# Patient Record
Sex: Male | Born: 1955
Health system: Southern US, Community
[De-identification: ages and names within clinical notes are randomized; demographics above are authoritative.]

## PROBLEM LIST (undated history)

## (undated) DIAGNOSIS — M109 Gout, unspecified: Secondary | ICD-10-CM

## (undated) DIAGNOSIS — I10 Essential (primary) hypertension: Secondary | ICD-10-CM

## (undated) DIAGNOSIS — M1990 Unspecified osteoarthritis, unspecified site: Secondary | ICD-10-CM

## (undated) DIAGNOSIS — M5126 Other intervertebral disc displacement, lumbar region: Secondary | ICD-10-CM

## (undated) HISTORY — PX: BACK SURGERY: SHX140

---

## 2003-09-23 ENCOUNTER — Emergency Department (HOSPITAL_COMMUNITY): Admission: EM | Admit: 2003-09-23 | Discharge: 2003-09-23 | Payer: Self-pay | Admitting: Emergency Medicine

## 2005-04-16 ENCOUNTER — Emergency Department (HOSPITAL_COMMUNITY): Admission: EM | Admit: 2005-04-16 | Discharge: 2005-04-16 | Payer: Self-pay | Admitting: Family Medicine

## 2005-04-18 ENCOUNTER — Emergency Department (HOSPITAL_COMMUNITY): Admission: AD | Admit: 2005-04-18 | Discharge: 2005-04-18 | Payer: Self-pay | Admitting: Family Medicine

## 2005-04-18 ENCOUNTER — Ambulatory Visit (HOSPITAL_COMMUNITY): Admission: RE | Admit: 2005-04-18 | Discharge: 2005-04-18 | Payer: Self-pay | Admitting: Family Medicine

## 2008-08-26 ENCOUNTER — Emergency Department (HOSPITAL_COMMUNITY): Admission: EM | Admit: 2008-08-26 | Discharge: 2008-08-26 | Payer: Self-pay | Admitting: Emergency Medicine

## 2008-12-31 ENCOUNTER — Emergency Department (HOSPITAL_COMMUNITY): Admission: EM | Admit: 2008-12-31 | Discharge: 2008-12-31 | Payer: Self-pay | Admitting: Emergency Medicine

## 2009-06-30 ENCOUNTER — Emergency Department (HOSPITAL_COMMUNITY): Admission: EM | Admit: 2009-06-30 | Discharge: 2009-06-30 | Payer: Self-pay | Admitting: Emergency Medicine

## 2009-08-11 ENCOUNTER — Ambulatory Visit: Payer: Self-pay | Admitting: Internal Medicine

## 2009-09-01 ENCOUNTER — Ambulatory Visit: Payer: Self-pay | Admitting: Family Medicine

## 2009-09-04 ENCOUNTER — Ambulatory Visit (HOSPITAL_COMMUNITY): Admission: RE | Admit: 2009-09-04 | Discharge: 2009-09-04 | Payer: Self-pay | Admitting: Family Medicine

## 2009-09-12 ENCOUNTER — Ambulatory Visit: Payer: Self-pay | Admitting: Internal Medicine

## 2009-09-12 ENCOUNTER — Encounter (INDEPENDENT_AMBULATORY_CARE_PROVIDER_SITE_OTHER): Payer: Self-pay | Admitting: Family Medicine

## 2009-09-12 LAB — CONVERTED CEMR LAB
ALT: 71 units/L — ABNORMAL HIGH (ref 0–53)
Alkaline Phosphatase: 54 units/L (ref 39–117)
Basophils Absolute: 0.1 10*3/uL (ref 0.0–0.1)
Basophils Relative: 1 % (ref 0–1)
CO2: 22 meq/L (ref 19–32)
CRP: 0 mg/dL (ref <0.6)
Cholesterol: 108 mg/dL (ref 0–200)
Creatinine, Ser: 1.16 mg/dL (ref 0.40–1.50)
MCHC: 32.6 g/dL (ref 30.0–36.0)
Neutro Abs: 2.4 10*3/uL (ref 1.7–7.7)
Neutrophils Relative %: 43 % (ref 43–77)
Platelets: 152 10*3/uL (ref 150–400)
RDW: 12.6 % (ref 11.5–15.5)
Sed Rate: 3 mm/hr (ref 0–16)
Total Bilirubin: 1.1 mg/dL (ref 0.3–1.2)
Total CHOL/HDL Ratio: 3.1
VLDL: 21 mg/dL (ref 0–40)

## 2009-09-29 ENCOUNTER — Encounter (INDEPENDENT_AMBULATORY_CARE_PROVIDER_SITE_OTHER): Payer: Self-pay | Admitting: Family Medicine

## 2009-09-29 ENCOUNTER — Ambulatory Visit: Payer: Self-pay | Admitting: Internal Medicine

## 2009-09-29 LAB — CONVERTED CEMR LAB
Eosinophils Relative: 4 % (ref 0–5)
HCT: 47.3 % (ref 39.0–52.0)
HCV Ab: REACTIVE — AB
Hemoglobin: 15.5 g/dL (ref 13.0–17.0)
INR: 1.04 (ref <1.50)
Lymphocytes Relative: 39 % (ref 12–46)
Lymphs Abs: 1.9 10*3/uL (ref 0.7–4.0)
Monocytes Absolute: 0.4 10*3/uL (ref 0.1–1.0)
RDW: 12.5 % (ref 11.5–15.5)
WBC: 4.9 10*3/uL (ref 4.0–10.5)
aPTT: 32 s (ref 24–37)

## 2009-11-04 ENCOUNTER — Ambulatory Visit: Payer: Self-pay | Admitting: Internal Medicine

## 2009-11-04 ENCOUNTER — Encounter (INDEPENDENT_AMBULATORY_CARE_PROVIDER_SITE_OTHER): Payer: Self-pay | Admitting: Family Medicine

## 2009-11-04 LAB — CONVERTED CEMR LAB
HCV Quantitative: 1410000 intl units/mL — ABNORMAL HIGH (ref <43)
Hepatitis B Surface Ag: NEGATIVE

## 2009-11-21 ENCOUNTER — Ambulatory Visit (HOSPITAL_COMMUNITY): Admission: RE | Admit: 2009-11-21 | Discharge: 2009-11-21 | Payer: Self-pay | Admitting: Family Medicine

## 2009-11-25 ENCOUNTER — Ambulatory Visit: Payer: Self-pay | Admitting: Internal Medicine

## 2009-12-01 ENCOUNTER — Ambulatory Visit (HOSPITAL_COMMUNITY): Admission: RE | Admit: 2009-12-01 | Discharge: 2009-12-01 | Payer: Self-pay | Admitting: Internal Medicine

## 2010-02-02 ENCOUNTER — Ambulatory Visit: Payer: Self-pay | Admitting: Family Medicine

## 2010-02-02 DIAGNOSIS — M25559 Pain in unspecified hip: Secondary | ICD-10-CM

## 2010-02-06 ENCOUNTER — Ambulatory Visit (HOSPITAL_COMMUNITY): Admission: RE | Admit: 2010-02-06 | Discharge: 2010-02-06 | Payer: Self-pay | Admitting: Family Medicine

## 2010-02-10 ENCOUNTER — Encounter (INDEPENDENT_AMBULATORY_CARE_PROVIDER_SITE_OTHER): Payer: Self-pay | Admitting: *Deleted

## 2010-02-10 ENCOUNTER — Telehealth (INDEPENDENT_AMBULATORY_CARE_PROVIDER_SITE_OTHER): Payer: Self-pay | Admitting: *Deleted

## 2010-02-18 ENCOUNTER — Telehealth (INDEPENDENT_AMBULATORY_CARE_PROVIDER_SITE_OTHER): Payer: Self-pay | Admitting: *Deleted

## 2010-02-19 ENCOUNTER — Encounter (INDEPENDENT_AMBULATORY_CARE_PROVIDER_SITE_OTHER): Payer: Self-pay | Admitting: *Deleted

## 2010-02-23 ENCOUNTER — Encounter (INDEPENDENT_AMBULATORY_CARE_PROVIDER_SITE_OTHER): Payer: Self-pay | Admitting: *Deleted

## 2010-02-24 ENCOUNTER — Ambulatory Visit (HOSPITAL_COMMUNITY): Admission: RE | Admit: 2010-02-24 | Discharge: 2010-02-24 | Payer: Self-pay | Admitting: Family Medicine

## 2010-02-26 ENCOUNTER — Telehealth (INDEPENDENT_AMBULATORY_CARE_PROVIDER_SITE_OTHER): Payer: Self-pay | Admitting: *Deleted

## 2010-02-27 ENCOUNTER — Ambulatory Visit: Payer: Self-pay | Admitting: Family Medicine

## 2010-03-03 ENCOUNTER — Encounter (INDEPENDENT_AMBULATORY_CARE_PROVIDER_SITE_OTHER): Payer: Self-pay | Admitting: *Deleted

## 2010-03-05 ENCOUNTER — Ambulatory Visit: Payer: Self-pay | Admitting: Gastroenterology

## 2010-03-17 ENCOUNTER — Encounter: Payer: Self-pay | Admitting: Family Medicine

## 2010-04-06 ENCOUNTER — Ambulatory Visit: Payer: Self-pay | Admitting: Family Medicine

## 2010-04-14 ENCOUNTER — Telehealth: Payer: Self-pay | Admitting: Family Medicine

## 2010-04-27 ENCOUNTER — Encounter: Payer: Self-pay | Admitting: Family Medicine

## 2010-04-29 ENCOUNTER — Encounter: Payer: Self-pay | Admitting: Family Medicine

## 2010-05-06 ENCOUNTER — Encounter (INDEPENDENT_AMBULATORY_CARE_PROVIDER_SITE_OTHER): Payer: Self-pay | Admitting: *Deleted

## 2010-05-18 ENCOUNTER — Ambulatory Visit
Admission: RE | Admit: 2010-05-18 | Discharge: 2010-05-18 | Payer: Self-pay | Source: Home / Self Care | Attending: Family Medicine | Admitting: Family Medicine

## 2010-05-18 ENCOUNTER — Encounter: Payer: Self-pay | Admitting: Family Medicine

## 2010-05-31 ENCOUNTER — Encounter: Payer: Self-pay | Admitting: Family Medicine

## 2010-06-02 ENCOUNTER — Telehealth: Payer: Self-pay | Admitting: Family Medicine

## 2010-06-08 ENCOUNTER — Ambulatory Visit
Admission: RE | Admit: 2010-06-08 | Discharge: 2010-06-08 | Payer: Self-pay | Source: Home / Self Care | Attending: Family Medicine | Admitting: Family Medicine

## 2010-06-08 ENCOUNTER — Encounter: Payer: Self-pay | Admitting: Family Medicine

## 2010-06-11 NOTE — Letter (Signed)
Summary: Disability Determination Services  Disability Determination Services   Imported By: Marily Memos 03/18/2010 12:05:18  _____________________________________________________________________  External Attachment:    Type:   Image     Comment:   External Document

## 2010-06-11 NOTE — Progress Notes (Signed)
Summary: letter for disability  Phone Note Call from Patient   Caller: Patient Summary of Call: Spoke with pt- he states he was denied for disability because he needs a letter from Dr. Jennette Kettle stating what his dx is and how that causes him to be disabled.  Advised will let Dr. Jennette Kettle know he needs letter.  Pt asks that he be called when letter is complete, and he will pick it up at office. Initial call taken by: Rochele Pages RN,  April 14, 2010 10:41 AM      Complete Medication List: 1)  Vicodin 5-500 Mg Tabs (Hydrocodone-acetaminophen) .... 2 tabs by mouth two times a day as needed pain 2)  Naproxen 500 Mg Tabs (Naproxen) .... I tab daily

## 2010-06-11 NOTE — Progress Notes (Signed)
----   Converted from flag ---- ---- 02/17/2010 4:29 PM, Lillia Pauls CMA wrote: ---- 02/17/2010 1:33 PM, Marily Memos wrote: Pt left a message asking for you to call him regarding his MRI.  call back # [phone] ------------------------------ called and no answer--no voice mail. will try later  Denny Levy MD  February 18, 2010 9:01 AM [place]--I have tried this guy twice and no answer--maybe you could try him this PM? All I need to ask him is does he want me to set up an LS Spine MRI--his hip MRI was totally normal but his hip pain MAY be coming from his back-Thanks!  Denny Levy MD  February 19, 2010 11:03 AM   tried both numbers twic and no answer. will mail letter. Lillia Pauls CMA  February 19, 2010 1:59 PM

## 2010-06-11 NOTE — Letter (Signed)
Summary: [facility] orthopaedics clinic referral form  [facility] orthopaedics clinic referral form   Imported By: Marily Memos 04/30/2010 08:46:45  _____________________________________________________________________  External Attachment:    Type:   Image     Comment:   External Document

## 2010-06-11 NOTE — Letter (Signed)
Summary: Results Follow-up Letter  [facility]  [address]   [place], [place] [address]   Phone: [phone]  Fax: [phone]    02/19/2010  [address] [place], [place]  [address]  Dear Mr. HAYDON,   The following are the results of your recent test(s):  Test     Result     Pap Smear    Normal_______  Not Normal_____       Comments: _________________________________________________________ Cholesterol LDL(Bad cholesterol):          Your goal is less than:         HDL (Good cholesterol):        Your goal is more than: _________________________________________________________ Other Tests:  Attempted to call to ask if you wanted our office to set up an LS Spine MRI--your hip MRI was totally normal but your hip pain MAY be coming from your back-Thanks! _________________________________________________________  Please call for an appointment Or _________________________________________________________ _________________________________________________________ _________________________________________________________  Sincerely,  Denny Levy, M.D./[facility]

## 2010-06-11 NOTE — Progress Notes (Signed)
  Phone Note Outgoing Call   Summary of Call: Arelia Longest / Amy once again I cannot get hold of this fellow. Ileft a message with the "person who owns the phone" for Mr Null to call me. IF he calls back and you get the call, please tell him this: His hip pain is VERY LIKELY from his back--he has a LOT of back arthritis and would PROBABLY benefit from back surgery. Unfortunately he has no insurance---so if he wants to pursue surgery we MIGHT be able to send him to [facility].  Denny Levy MD  February 26, 2010 9:34 AM    Follow-up for Phone Call        pt has appt on 02/26/10 with you Follow-up by: Lillia Pauls CMA,  February 26, 2010 3:33 PM

## 2010-06-11 NOTE — Miscellaneous (Signed)
Summary: MED UPDATE  Clinical Lists Changes  Medications: Added new medication of VICODIN 5-500 MG TABS (HYDROCODONE-ACETAMINOPHEN) ii tabs daily Added new medication of NAPROXEN 500 MG TABS (NAPROXEN) i tab daily Added new medication of GABAPENTIN 300 MG CAPS (GABAPENTIN) 1 qpm x 7d then 2 qpm x 7d then 3 qpm x 7d then 3 qpm & 1 qam x 7d then 3 qpm 2 qam x 7d then 3 qpm  and 3 qam until fu appt - Signed Rx of GABAPENTIN 300 MG CAPS (GABAPENTIN) 1 qpm x 7d then 2 qpm x 7d then 3 qpm x 7d then 3 qpm & 1 qam x 7d then 3 qpm 2 qam x 7d then 3 qpm  and 3 qam until fu appt;  #180 x 0;  Signed;  Entered by: Lillia Pauls CMA;  Authorized by: Denny Levy MD;  Method used: Historical   1 qpm x 7d then 2 qpm x 7d then 3 qpm x 7d then 3 qpm & 1 qam x 7d then 3 qpm 2 qam x 7d then 3 qpm  and 3 qam  Prescriptions: GABAPENTIN 300 MG CAPS (GABAPENTIN) 1 qpm x 7d then 2 qpm x 7d then 3 qpm x 7d then 3 qpm & 1 qam x 7d then 3 qpm 2 qam x 7d then 3 qpm  and 3 qam until fu appt  #180 x 0   Entered by:   Lillia Pauls CMA   Authorized by:   Denny Levy MD   Signed by:   Lillia Pauls CMA on 03/03/2010   Method used:   Historical   RxID:   [id number]

## 2010-06-11 NOTE — Assessment & Plan Note (Signed)
Summary: FU PER [name] [name]/MJD   Vital Signs:  Patient profile:   55 year old male BP sitting:   136 / 97  Vitals Entered By: Lillia Pauls CMA (May 18, 2010 1:28 PM)  History of Present Illness: hip pain continues vicodin is making him nauseated  and diarrhea--alsoo it is not really controlling his pain--it is 7/10 most of the time, 9/10 without meds wants totry something different has appt Jan 18 with ortho at [facility]  Current Medications (verified): 1)  Roxicodone 15 Mg Tabs (Oxycodone Hcl) .... Generic Please 1 By Mouth Qid As Needed Pain 2)  Naproxen 500 Mg Tabs (Naproxen) .... I Tab Daily  Allergies: No Known Drug Allergies  Physical Exam  General:  alert, well-developed, well-nourished, and well-hydrated.   Msk:  R hip decreased IR /ER. Gait antalgic mild atrophy right quad distally neurovascualrly intact   Impression & Recommendations:  Problem # 1:  HIP PAIN, RIGHT (ICD-719.45)  His updated medication list for this problem includes:    Roxicodone 15 Mg Tabs (Oxycodone hcl) .Marland Kitchen... Generic please 1 by mouth qid as needed pain    Naproxen 500 Mg Tabs (Naproxen) ..... I tab daily f/ui after his appt [facility] will try oxicodone instead of hydrocodone  Complete Medication List: 1)  Roxicodone 15 Mg Tabs (Oxycodone hcl) .... Generic please 1 by mouth qid as needed pain 2)  Naproxen 500 Mg Tabs (Naproxen) .... I tab daily Prescriptions: ROXICODONE 15 MG TABS (OXYCODONE HCL) generic please 1 by mouth qid as needed pain  #120 x 0   Entered and Authorized by:   Denny Levy MD   Signed by:   Denny Levy MD on 05/18/2010   Method used:   Print then Give to Patient   RxID:   [id number]    Orders Added: 1)  Est. Patient Level III [14782]

## 2010-06-11 NOTE — Assessment & Plan Note (Signed)
Summary: POST MRI,MC   Vital Signs:  Patient profile:   55 year old male Pulse rate:   64 / minute BP sitting:   112 / 74  (right arm)  Vitals Entered By: Rochele Pages RN (February 27, 2010 9:53 AM) CC: f/u MRI   CC:  f/u MRI.  History of Present Illness: f/u right hip pain 9see previous notes) No better, infact itis hurting more--he thinks maybe because the weather is getting cooler. He is having more difficulty getting up from a seated position, more pain with sitting or lying down that he was. He continues with severe right hip pain with standing and is using a cane for walking--can walk 1/2 a block at most at a time.  Pain is in right hip and radiates down a little to front of thigh.  PERTINENT PMH/PSH: 49 y ago he had back surgery (some type of low back disc surgery???) and then 3 days later he "blew out the patch" they had placed; he returned to the hosdpital and had some type of procedure done to "replace te plug". This was done in [place] .  Allergies: No Known Drug Allergies  Review of Systems  The patient denies anorexia, fever, weight loss, and weight gain.    Physical Exam  General:  alert and well-developed.   Eyes:  left esotropia Msk:  RIght hip decreased IR / ER. + SLR right and left hip flexor strength 5/5 B Additional Exam:  LS  Spine MRI: I reviewed images with Mr CDoles--I agree with the radiology interpretation as printed--essentially severe central canal stenosis at L4-5, L5 S1 with B facet arthrooppathy at L5-S1. "Bione fragement noted".   Impression & Recommendations:  Problem # 1:  DEGENERATIVE JOINT DISEASE, LUMBAR SPINE (ICD-721.90) I think thhi is cause  of hi s hip pain. His lumbar disease is fairly severe--he likely needs an eval by a surgeon --as he has no insurance at this time I cannot get him seen easily.   we spent > 50% of 40 minute ov reviewing his MRI images, in counseling and education re his source of hip pain (back) and his  options. We wil ltry tapering up his gabapentin and rtc. Red flags discussed for more urgent return to clinic otherwise rtc 5-6 weeks.  Patient Instructions: 1)  start gabapentin 2)   one tab at night for a week,  3)  then increase to two tabs at nigt for a week, 4)   then three tabs at night for a week,  5)  then three tabs at night and one in morning for a week, 6)   then 3 at night, two in morning and  7)  then three at night and three in morning and keep on this dose until I see you back 8)  Let me see you in 5 weeks or so   Orders Added: 1)  Est. Patient Level IV [56213]

## 2010-06-11 NOTE — Progress Notes (Signed)
  Phone Note Outgoing Call   Summary of Call: [place] / Amy I have tried calling this gentleman re his MRI--the work number is a [employer] and his phone number is not working right now (temporarily out of service)--could you try him later or see if we have another number for him--I want to tell him that his HIP MRI was actually totally normal. I think we need to do a LS spine MRI as I suspect his pain is actually from te low back. If you get hold of him please set up. I willl put order in. Thanks!  Denny Levy MD  February 10, 2010 10:38 AM   Follow-up for Phone Call        mailed letter Follow-up by: Lillia Pauls CMA,  February 10, 2010 2:17 PM  New Problems: DEGENERATIVE JOINT DISEASE, LUMBAR SPINE (ICD-721.90)   New Problems: DEGENERATIVE JOINT DISEASE, LUMBAR SPINE (ICD-721.90)

## 2010-06-11 NOTE — Progress Notes (Signed)
----   Converted from flag ---- ---- 06/02/2010 1:49 PM, [name] RN wrote: pt sts [place] and [place] have not come in from the flight yet...of course his meds are in his bags.[name] [name].he wants to know if he can get a refill/some meds until his bags arrive. thx ------------------------------

## 2010-06-11 NOTE — Assessment & Plan Note (Signed)
Summary: RT PELVIC PAIN,MC   Vital Signs:  Patient profile:   55 year old male Height:      73 inches Weight:      210 pounds BMI:     27.81 Pulse rate:   87 / minute BP sitting:   147 / 88  (right arm) CC: RT Hip pain x 1 year worsening   CC:  RT Hip pain x 1 year worsening.  History of Present Illness: 1 year worsening hip pain. Has had to decrease his activitis. pain is worse with walking--even one block ispainful. Rest makes it better. no numbness or tingling in legs. no incontinence  Current Medications (verified): 1)  None  Allergies (verified): No Known Drug Allergies  Review of Systems  The patient denies anorexia, fever, weight loss, and weight gain.    Physical Exam  General:  alert, well-developed, well-nourished, and well-hydrated.   Msk:  Right hip decreased IR and ER compared with left. Painful IR / ER hip flexor strngth 5/5. quad muscle bulk and tone symmetrical  GAIT: antalgic with shortened stance time on right Neurologic:  LE sensation intact to soft touch Additional Exam:  reviewed hip films from April--he seems to have a shallow acetabulum.   Impression & Recommendations:  Problem # 1:  HIP PAIN, RIGHT (ICD-719.45)  Orders: MRI without Contrast (MRI w/o Contrast) His DJD does not look significant enough on his films to account for his limitations--I wonder if he is experiencing AVN hip--esp given his apparent shallow acetabulum. Will get MRI as he is virtually unable to work currently, very debilitated in his activities.  Patient Instructions: 1)  mri appt is on thurs morning, sept 29th at 8am but you need to be there by 7:30 to register at [facility] hospital. [phone]

## 2010-06-11 NOTE — Letter (Signed)
Summary: *Referral Letter  [facility]  [address]   [place], [place] [address]   Phone: [phone]  Fax: [phone]    05/18/2010  Thank you in advance for agreeing to see my patient:  [name] [name] [address] [place], [place]  [address]  Phone: [phone]  Reason for Referral: Right hip pain, possibly from lumbar source MRI report attached as is hip xray   Procedures Requested:   Current Medical Problems: 1)  DEGENERATIVE JOINT DISEASE, LUMBAR SPINE (ICD-721.90) 2)  HIP PAIN, RIGHT (ICD-719.45)   Current Medications: 1)  ROXICODONE 15 MG TABS (OXYCODONE HCL) generic please 1 by mouth qid as needed pain 2)  NAPROXEN 500 MG TABS (NAPROXEN) i tab daily   Past Medical History:     Thank you again for agreeing to see our patient; please contact us if you have any further questions or need additional information.  Sincerely,  Denny Levy MD

## 2010-06-11 NOTE — Letter (Signed)
Summary: *Consult Note  [facility]  [address]   [place], [place] [address]   Phone: [phone]  Fax: [phone]    Re:    [name] [name] DOB:    [birth year]   Dear:    A copy of the detailed office note will be sent under separate cover, for your review at your request.  Evaluation today is consistent with: severe RIGHT HIP OSTEOARTHRITIS. [name] [name] has severe pain in his hip. This is worse with  standing or walking very far( > 50 feet)  Our recommendation is for: evaluation by orthopedic surgeon for total hip replacement.   New Orders include:  1)  Orthopedic Referral [Ortho]   New Medications started today include:    After today's visit, the patients current medications include: 1)  VICODIN 5-500 MG TABS (HYDROCODONE-ACETAMINOPHEN) 2 tabs by mouth two times a day as needed pain 2)  NAPROXEN 500 MG TABS (NAPROXEN) i tab daily   Thank you for this consultation.  If you have any further questions regarding the care of this patient, please do not hesitate to contact me @ [phone]  Thank you for this opportunity to look after your patient.  Sincerely,   Denny Levy MD

## 2010-06-11 NOTE — Assessment & Plan Note (Signed)
Summary: F/U POST MRI,MC   Vital Signs:  Patient profile:   55 year old male BP sitting:   135 / 89  Vitals Entered By: Lillia Pauls CMA (April 06, 2010 1:34 PM)  History of Present Illness: RIGHT hip pain no better, infact he thinks it is worse. Hurts all of the time at least at a 6 level, activity making it go up to 9 or 10. He tapered up on the gabapentin but it has not seemed to help. Wants to pursue referral to [facility]  Current Medications (verified): 1)  Vicodin 5-500 Mg Tabs (Hydrocodone-Acetaminophen) .... Ii Tabs Daily 2)  Naproxen 500 Mg Tabs (Naproxen) .... I Tab Daily 3)  Gabapentin 300 Mg Caps (Gabapentin) .Marland Kitchen.. 1 Qpm X 7d Then 2 Qpm X 7d Then 3 Qpm X 7d Then 3 Qpm & 1 Qam X 7d Then 3 Qpm 2 Qam X 7d Then 3 Qpm  and 3 Qam Until Fu Appt  Allergies: No Known Drug Allergies  Review of Systems       no new hip symptoms except for increased pain  Physical Exam  General:  alert, well-developed, well-nourished, and well-hydrated.   Msk:  RIGT hip IR /ER is symptoms,,etrical with that of left (mildly limited--not painful) SLR Positive Right Additional Exam:  review of MRI   Impression & Recommendations:  Problem # 1:  DEGENERATIVE JOINT DISEASE, LUMBAR SPINE (ICD-721.90) will d/c gabapentin pain meds for two times a day use. try to get him referral to [facility] rtc 1 m  Complete Medication List: 1)  Vicodin 5-500 Mg Tabs (Hydrocodone-acetaminophen) .... 2 tabs by mouth two times a day as needed pain 2)  Naproxen 500 Mg Tabs (Naproxen) .... I tab daily Prescriptions: VICODIN 5-500 MG TABS (HYDROCODONE-ACETAMINOPHEN) 2 tabs by mouth two times a day as needed pain  #60 x 0   Entered and Authorized by:   Denny Levy MD   Signed by:   Denny Levy MD on 04/07/2010   Method used:   Handwritten   RxID:   [id number]    Orders Added: 1)  Est. Patient Level III [[address]]

## 2010-06-11 NOTE — Miscellaneous (Signed)
  MRI OF LS SPINE IS SCHD FOR 02/24/10 AT 3 PM AT [facility]. PT TO ARRIVE IN ADMITTING AT 2:45PM FOR REGISTRATION. KEEP APPT SCHD FOR FRI WITH DR. Jennette Kettle [phone]

## 2010-06-11 NOTE — Letter (Signed)
Summary: Results Follow-up Letter  [facility]  [address]   [place], [place] [address]   Phone: [phone]  Fax: [phone]    02/10/2010  [address] [place], [place]  [address]  Dear Mr. PLOEGER,   The following are the results of your recent test(s):  Test     Result     Pap Smear    Normal_______  Not Normal_____       Comments: _________________________________________________________ Cholesterol LDL(Bad cholesterol):          Your goal is less than:         HDL (Good cholesterol):        Your goal is more than: _________________________________________________________ Other Tests:  We have tried several time to contact you with the numbers we have in our database but no success. Wanted to inform you that your MRI of your hip was totally normal but I do think we need to get a LS spine MRI (of your low back) because i do suspect your pain may be coming from the low back  _________________________________________________________  Please call for an appointment Or _________________________________________________________ _________________________________________________________ _________________________________________________________  Sincerely,  Denny Levy, M.D. [facility]

## 2010-06-11 NOTE — Letter (Signed)
Summary: Generic Letter  [facility]  [address]   [place], [place] [address]   Phone: [phone]  Fax: [phone]    05/06/2010  [name] [name] [address] [place], [place]  [address]  Dear [name] [name],  I have made a referral for you to [facility] for evaluation of your knee pain.  Their office will be calling you to set up your appointment.  The telephone number for [facility] is [phone].         Sincerely,   [name] RN  Appended Document: Generic Letter Letter completed per patient's request.

## 2010-06-16 ENCOUNTER — Telehealth: Payer: Self-pay | Admitting: Family Medicine

## 2010-06-17 NOTE — Letter (Signed)
Summary: [facility] healthcare   Imported By: Marily Memos 06/08/2010 14:07:58  _____________________________________________________________________  External Attachment:    Type:   Image     Comment:   External Document

## 2010-06-17 NOTE — Letter (Signed)
Summary: [facility]  ROI   Imported By: Marily Memos 06/08/2010 14:06:49  _____________________________________________________________________  External Attachment:    Type:   Image     Comment:   External Document

## 2010-06-17 NOTE — Assessment & Plan Note (Signed)
Summary: F/U HIP,MC   Vital Signs:  Patient profile:   55 year old male BP sitting:   149 / 95  Vitals Entered By: Rochele Pages RN (June 08, 2010 1:36 PM)  History of Present Illness: f/u hip pain, low back problems. was seen at [facility] clinics--was actually seen there twice--they referred him to spine specialist and pain clinic. Per Zollie Beckers, they agreed his hip pain  ios likely form his back and taht surgery might help--he does not have the funds to try surgery. They also offered injection tehrapy but he would have to have $500 up fron for that so he is not sure that is an option either. He has f/u appt with them in a month. Ininterim, they wantd him to continue the narcotic pain meds we had started---he siad that really helped. If he takes the roxicodone qid he was able to do all of his adls, evenwas able to take his sone to a ball game. No dizziness or anorexia from this med.  Current Medications (verified): 1)  Roxicodone 15 Mg Tabs (Oxycodone Hcl) .... Generic Please 1 By Mouth Qid As Needed Pain 2)  Naproxen 500 Mg Tabs (Naproxen) .... I Tab Daily  Allergies: No Known Drug Allergies  Review of Systems  The patient denies anorexia, fever, weight loss, and weight gain.    Physical Exam  General:  alert, well-developed, well-nourished, and well-hydrated.   Msk:  GAIT is antalgic   Impression & Recommendations:  Problem # 1:  DEGENERATIVE JOINT DISEASE, LUMBAR SPINE (ICD-721.90)  Problem # 2:  HIP PAIN, RIGHT (ICD-719.45)  His updated medication list for this problem includes:    Roxicodone 15 Mg Tabs (Oxycodone hcl) .Marland Kitchen... Generic please 1 by mouth qid as needed pain    Naproxen 500 Mg Tabs (Naproxen) ..... I tab daily long discussion--for now I will be comfortable managing his pain meds atthe current dose. He is going to investigate his resources to see if he can find the $ to try an injection. he di dnot pick up the short term rx I g=had writtenf for him. Rx given today as  bleow  Complete Medication List: 1)  Roxicodone 15 Mg Tabs (Oxycodone hcl) .... Generic please 1 by mouth qid as needed pain 2)  Naproxen 500 Mg Tabs (Naproxen) .... I tab daily Prescriptions: ROXICODONE 15 MG TABS (OXYCODONE HCL) generic please 1 by mouth qid as needed pain  #120 x 0   Entered and Authorized by:   Denny Levy MD   Signed by:   Denny Levy MD on 06/09/2010   Method used:   Handwritten   RxID:   [id number]    Orders Added: 1)  Est. Patient Level III [14782]

## 2010-06-25 NOTE — Progress Notes (Signed)
----   Converted from flag ---- ---- 06/16/2010 11:49 AM, Lillia Pauls CMA wrote:  ---- 06/16/2010 10:59 AM, Marily Memos wrote: Patient called to let Dr. Jennette Kettle know that the medicine she prescribed is working for him. ------------------------------

## 2010-07-06 ENCOUNTER — Ambulatory Visit: Payer: Self-pay | Admitting: Family Medicine

## 2010-07-07 ENCOUNTER — Encounter: Payer: Self-pay | Admitting: Family Medicine

## 2010-07-07 ENCOUNTER — Ambulatory Visit (INDEPENDENT_AMBULATORY_CARE_PROVIDER_SITE_OTHER): Payer: Self-pay | Admitting: Family Medicine

## 2010-07-07 DIAGNOSIS — M479 Spondylosis, unspecified: Secondary | ICD-10-CM

## 2010-07-07 DIAGNOSIS — M25559 Pain in unspecified hip: Secondary | ICD-10-CM

## 2010-07-16 NOTE — Assessment & Plan Note (Signed)
Summary: f/u medication,mc   Vital Signs:  Patient profile:   55 year old male BP sitting:   158 / 104  Vitals Entered By: Rochele Pages RN (July 07, 2010 1:48 PM)  History of Present Illness: 55yo male to office today for f/u low back & hip pain.  typically follows with Dr. Jennette Kettle, but forgot had appointment yesterday & needed refill on his oxycodone.  Since starting oxycodone 14-month ago has been doing great.  No longer using a cane.  Able to do ADLs without difficulty & was able to return to bowling & working on cars.  Taking oxycodone 1 tab q 6-hrs, denies any side effects or adverse reactions.  Still taking naproxen as needed.  Awaiting f/u with [facility] for injections once he can afford.  He is also enrolled in a liver program for his Hepatitis C & he is doing well with that.  Allergies: No Known Drug Allergies  Review of Systems       per HPI  Physical Exam  General:  Well-developed,well-nourished,in no acute distress; alert,appropriate and cooperative throughout examination Msk:  HIPS: slightly decreased IR & ER on right compared to left, otherwise good ROM.  No tenderness over greater troch.  BACK: well healed mid-line surgical scar.  Good ROM today without pain.  No midline or paraspinal tenderness.  Mild TTP R SI-joint.  neg SLR b/l.  Good lower ext strength  GAIT: walking without a limp and without a cane Neurologic:  sensation intact to light touch DTR +2/4 achilles & PT b/l   Impression & Recommendations:  Problem # 1:  DEGENERATIVE JOINT DISEASE, LUMBAR SPINE (ICD-721.90) Assessment Improved - Refill on Oxycodone given.  Explained that this should be primarily managed by Dr. Jennette Kettle in the future.  He has been compliant with the medication & appears to be taking appropriately, therefore decided to refill today. - should consider f/u with [facility] if symptoms return & finances available - f/u 39-month with Dr. Jennette Kettle for re-evaluation  Problem # 2:  HIP PAIN, RIGHT  (ICD-719.45) Assessment: Improved - Refill on oxycodone given today as stated above - f/u 16-month with Dr. Jennette Kettle  His updated medication list for this problem includes:    Roxicodone 15 Mg Tabs (Oxycodone hcl) .Marland Kitchen... Generic please 1 by mouth qid as needed pain    Naproxen 500 Mg Tabs (Naproxen) ..... I tab daily  Complete Medication List: 1)  Roxicodone 15 Mg Tabs (Oxycodone hcl) .... Generic please 1 by mouth qid as needed pain 2)  Naproxen 500 Mg Tabs (Naproxen) .... I tab daily Prescriptions: ROXICODONE 15 MG TABS (OXYCODONE HCL) generic please 1 by mouth qid as needed pain  #120 x 0   Entered and Authorized by:   Darene Lamer MD   Signed by:   Rochele Pages RN on 07/07/2010   Method used:   Print then Give to Patient   RxID:   [id number]    Orders Added: 1)  Est. Patient Level III [14782]

## 2010-07-23 ENCOUNTER — Other Ambulatory Visit: Payer: Self-pay | Admitting: Gastroenterology

## 2010-07-23 DIAGNOSIS — B182 Chronic viral hepatitis C: Secondary | ICD-10-CM

## 2010-07-24 ENCOUNTER — Other Ambulatory Visit (HOSPITAL_COMMUNITY): Payer: Self-pay

## 2010-07-27 ENCOUNTER — Other Ambulatory Visit: Payer: Self-pay | Admitting: Interventional Radiology

## 2010-07-27 ENCOUNTER — Ambulatory Visit (HOSPITAL_COMMUNITY)
Admission: RE | Admit: 2010-07-27 | Discharge: 2010-07-27 | Disposition: A | Discharge status: Home / Self Care | Payer: Self-pay | Source: Ambulatory Visit | Attending: Gastroenterology | Admitting: Gastroenterology

## 2010-07-27 ENCOUNTER — Ambulatory Visit (HOSPITAL_COMMUNITY): Payer: Self-pay

## 2010-07-27 DIAGNOSIS — B182 Chronic viral hepatitis C: Secondary | ICD-10-CM | POA: Insufficient documentation

## 2010-07-27 DIAGNOSIS — Z01812 Encounter for preprocedural laboratory examination: Secondary | ICD-10-CM | POA: Insufficient documentation

## 2010-07-27 LAB — PROTIME-INR
INR: 0.95 (ref 0.00–1.49)
Prothrombin Time: 12.9 seconds (ref 11.6–15.2)

## 2010-07-27 LAB — CBC
MCHC: 33.8 g/dL (ref 30.0–36.0)
RDW: 12.1 % (ref 11.5–15.5)

## 2010-08-07 ENCOUNTER — Ambulatory Visit (INDEPENDENT_AMBULATORY_CARE_PROVIDER_SITE_OTHER): Payer: Self-pay | Admitting: Family Medicine

## 2010-08-07 DIAGNOSIS — M25559 Pain in unspecified hip: Secondary | ICD-10-CM

## 2010-08-07 DIAGNOSIS — M479 Spondylosis, unspecified: Secondary | ICD-10-CM

## 2010-08-07 NOTE — Progress Notes (Signed)
  Subjective:    Patient ID: [name] [name], male    DOB: 12-25-1955, 55 y.o.   MRN: [id number]  HPI  [name] [name] returns for followup of severe low back and leg pain. He has arranged to have back surgery by the surgeons in [place] probably in the fall September or October. Until then he is quite happy to be on the current pain regimen. He is able to walk without his walker most of the time. He still has pain, but it is manageable. He is having no problems from the medication. He is currently living with his mother in [place] senilis he can decrease the number of office visits as he has to get a ride a for gas etc.  Review of Systems    negative for any unusual weight loss. No constipation. Objective:   Physical Exam     General well-developed slender male no acute distress. Musculoskeletal: His gait is fairly fluid today. He rises from a chair without use of a cane or walker.   Assessment & Plan:  #1 chronic low back and leg pain. Plan is for surgery in September or October. Until then I will continue his current pain  Medicine regimen. I have given him 3 prescriptions for his oxycodone 15 mg. 1 by mouth 4 times a day. #120 prescription given to be filled today one dated today not to refill that for the April and one not refillable for 30 May he will followup in 3 months.

## 2010-08-10 ENCOUNTER — Encounter: Payer: Self-pay | Admitting: Family Medicine

## 2010-08-10 NOTE — Progress Notes (Signed)
  Subjective:    Patient ID: [name] [name], [name] [name]    DOB: 1955/07/19, 55 y.o.   MRN: [id number]  HPI    Review of Systems     Objective:   Physical Exam        Assessment & Plan:  He "washed" his oxycodne rx--we discussed. This time only we will fill it again for this month--he had taken it out and put in his pocket to fill--he has teh other two. He IS advised in future we will NOT refill any lost or stolen or washed narcotic rx Handwritten rx placed for him to pick up: oxycodone 15 mg tabs, #120, no refill

## 2010-10-06 ENCOUNTER — Other Ambulatory Visit: Payer: Self-pay | Admitting: Family Medicine

## 2010-10-06 MED ORDER — OXYCODONE HCL 15 MG PO TABS: ORAL_TABLET | ORAL | Status: DC

## 2010-10-06 NOTE — Telephone Encounter (Signed)
[name] [name] came by [facility] for rx.

## 2010-11-06 ENCOUNTER — Ambulatory Visit (INDEPENDENT_AMBULATORY_CARE_PROVIDER_SITE_OTHER): Payer: Self-pay | Admitting: Family Medicine

## 2010-11-06 ENCOUNTER — Encounter: Payer: Self-pay | Admitting: Family Medicine

## 2010-11-06 VITALS — BP 117/80 | HR 90 | Ht 73.0 in | Wt 205.0 lb

## 2010-11-06 DIAGNOSIS — M479 Spondylosis, unspecified: Secondary | ICD-10-CM

## 2010-11-06 MED ORDER — OXYCODONE HCL 15 MG PO TB12: ORAL_TABLET | ORAL | Status: DC

## 2010-11-06 MED ORDER — OXYCODONE HCL 15 MG PO TABS: ORAL_TABLET | ORAL | Status: DC

## 2010-11-06 NOTE — Progress Notes (Signed)
  Subjective:    Patient ID: [name] [name], male    DOB: 03/01/1956, 55 y.o.   MRN: [id number]  HPI 55 yo M f/u chronic LBP.  Bad LSS at L3-L4 and L4-L5.  Working on [employer], thinks will get his card soon. Still wanting to get back surgery at [facility] in Sept/Oct of this year. Currently, pain at 6-7/10.  Doing oxycodone 15 mg qid. Overall this works well for him. Also being followed at [facility] for HCV, on meds for it.  Review of Systems Denies F/S/C    Objective:   Physical Exam Gen: NAD Psych: pleasant affect Back: mod ttp b/l paraspinal musculature       Assessment & Plan:  Chronic LBP with spinal stenosis - refilled oxycodone 15 mg qid #120 no refills, gave 3 separate scripts to last him 3 months - f/u at [facility] for back surgery when he can schedule - f/u 3 months for refills

## 2010-11-09 ENCOUNTER — Ambulatory Visit: Payer: Self-pay | Admitting: Family Medicine

## 2010-12-07 ENCOUNTER — Other Ambulatory Visit: Payer: Self-pay | Admitting: Family Medicine

## 2010-12-07 ENCOUNTER — Encounter: Payer: Self-pay | Admitting: Family Medicine

## 2010-12-07 MED ORDER — OXYCODONE HCL 15 MG PO TABS: ORAL_TABLET | ORAL | Status: DC

## 2010-12-07 NOTE — Progress Notes (Signed)
I accidentally wrote two iof his rx for teh extended release (oxyconton) rather than the immediate release (roxicodone) so he is here to exchange them  I have gotten his original rx back and gave him two new ones, one to fill today and one to fill in 30 days, roxicodone 15 mg, #120.

## 2011-02-01 ENCOUNTER — Encounter: Payer: Self-pay | Admitting: Family Medicine

## 2011-02-01 ENCOUNTER — Ambulatory Visit (INDEPENDENT_AMBULATORY_CARE_PROVIDER_SITE_OTHER): Payer: Medicaid Other | Admitting: Family Medicine

## 2011-02-01 VITALS — BP 125/79 | HR 80

## 2011-02-01 DIAGNOSIS — M48062 Spinal stenosis, lumbar region with neurogenic claudication: Secondary | ICD-10-CM | POA: Insufficient documentation

## 2011-02-01 DIAGNOSIS — G8929 Other chronic pain: Secondary | ICD-10-CM | POA: Insufficient documentation

## 2011-02-01 DIAGNOSIS — B1920 Unspecified viral hepatitis C without hepatic coma: Secondary | ICD-10-CM | POA: Insufficient documentation

## 2011-02-01 DIAGNOSIS — B182 Chronic viral hepatitis C: Secondary | ICD-10-CM

## 2011-02-01 MED ORDER — OXYCODONE HCL 15 MG PO TABS: ORAL_TABLET | ORAL | Status: DC

## 2011-02-01 NOTE — Progress Notes (Signed)
  Subjective:    Patient ID: [name] [name], male    DOB: Mar 19, 1956, 55 y.o.   MRN: [id number]  HPI  Followup hip pain that we have determined is from lumbar spine etiology. His pain has been fairly well controlled until the last 6 weeks or so. When I first  started seeing him he could not walk a city block because of pain. After putting him on the chronic narcotics he was able to walk about 3-1/2 blocks before he had to stop. Now he said he can walk only 2 blocks comfortably. He has got his disability, he has completed his treatment for his chronic hepatitis C. His viral load is evidently undetectable by his report. He has to have no further treatments for that. He is ready now to go back and get reevaluation of his back for surgery. He has gotten his Medicaid and his disability.  Review of Systems    Pertinent review of systems: negative for fever or unusual weight change.  Objective:   Physical Exam  GENERAL: Well developed, well nourished, no acute distress HIPS::30-40%  decreased IR & ER on right compared to left, otherwise good ROM. No tenderness over greater troch. SLR right positive seated GAIT: antalgic NEURO: DTR2+ B = knee.       Assessment & Plan:  1. Chronic low back and mostly right HIP pain from Lumbar spine stenosis   MRI report reviewed: ". Postoperative change at L4-5. Ossific fragment posterior to  the thecal sac could be be residual posterior elements after  surgery or heterotopic ossification. Diffuse disc bulge and  endplate spurring at this level cause persistent marked central  canal stenosis. Encroachment on both exiting L4 roots is again  noted. . Severe congenital and acquired central canal stenosis at L3-4  where there is also marked facet degenerative change." We wil lcontinue chronic pain meds until he get get back to [facility] and start process for back surgery. I urged him to proceed on with that--he was considering waiting until after the New Year but given  his increase in pain I think we should press on with the surgery. rtc 3 m Rx for #120 oxycodone, 3 sep rx as per chart  2. Chronic hepatitis C--s/p full treatment at [facility]

## 2011-02-02 ENCOUNTER — Other Ambulatory Visit: Payer: Self-pay | Admitting: *Deleted

## 2011-02-05 ENCOUNTER — Ambulatory Visit: Payer: Self-pay | Admitting: Family Medicine

## 2011-04-30 ENCOUNTER — Encounter: Payer: Self-pay | Admitting: Family Medicine

## 2011-04-30 ENCOUNTER — Ambulatory Visit (INDEPENDENT_AMBULATORY_CARE_PROVIDER_SITE_OTHER): Payer: Medicaid Other | Admitting: Family Medicine

## 2011-04-30 ENCOUNTER — Other Ambulatory Visit: Payer: Medicaid Other

## 2011-04-30 VITALS — BP 156/94 | HR 90

## 2011-04-30 DIAGNOSIS — M25559 Pain in unspecified hip: Secondary | ICD-10-CM

## 2011-04-30 DIAGNOSIS — M48062 Spinal stenosis, lumbar region with neurogenic claudication: Secondary | ICD-10-CM

## 2011-04-30 DIAGNOSIS — I10 Essential (primary) hypertension: Secondary | ICD-10-CM | POA: Insufficient documentation

## 2011-04-30 MED ORDER — OXYCODONE HCL 15 MG PO TABS: ORAL_TABLET | ORAL | Status: DC

## 2011-04-30 MED ORDER — HYDROCHLOROTHIAZIDE 25 MG PO TABS: 25.0000 mg | ORAL_TABLET | Freq: Every day | ORAL | Status: DC

## 2011-04-30 NOTE — Patient Instructions (Signed)
08/07/2010      Vitals - 1 value per visit 04/30/2011 02/01/2011 11/06/2010 08/07/2010  SYSTOLIC 156 125 161 153  DIASTOLIC 94 79 80 97   Vitals - 1 value per visit 07/07/2010 06/08/2010 05/18/2010 04/06/2010  SYSTOLIC 158 149 096 135  DIASTOLIC 104 95 97 89   Vitals - 1 value per visit 02/27/2010 02/02/2010  SYSTOLIC 112 147  DIASTOLIC 74 88   [name] [name] your blood pressure is supposed to be less than 140/80 on a regular basis.

## 2011-04-30 NOTE — Progress Notes (Signed)
  Subjective:    Patient ID: [name] [name], male    DOB: 11/02/1955, 55 y.o.   MRN: [id number]  HPI  #1. Chronic low back and right hip pain which is secondary to severe lumbar spinal stenosis. He was evaluated at [facility] and approve for surgery. Unfortunately he has not been able to safely $500 up for cost. For now he wants to continue with the pain medicines as it really makes about 110% difference in his ADLs. He still has pain some days more than others but is able to do much more than before.  #2. Hypertension. Has previously been on blood pressure medicine but has not really had a primary care physician in the last couple of years. He does not remember what medicine he was on. #3. Chronic hepatitis C followed by [facility]. He is in a treatment program with them through the hepatology clinic.  Review of Systems Denies fever, sweats, chills. Denies unusual weight loss or gain. He does have chronic right hip pain per history of present illness.    Objective:   Physical Exam Vital signs reviewed. GENERAL: Well-developed, well-nourished, no acute distress. CARDIOVASCULAR: Regular rate and rhythm no murmur gallop or rub LUNGS: Clear to auscultation bilaterally, no rales or wheeze. ABDOMEN: Soft positive bowel sounds NEURO: No gross focal neurological deficits. MSK: Movement of extremity x 4.  GAIT: Antalgic.         Assessment & Plan:  #1. Chronic low back and hip pain secondary to severe lumbar spinal stenosis. We'll continue current pain management regimen. He is doing exceptionally well on this. I gave him 3 months with prescriptions. #2. Hypertension. I reviewed his blood pressures over time with him. He has previously been on medicine. He does not currently have a primary care provider. I will check lab work and start him on HCTZ 25 mg. Followup 2 months.

## 2011-05-13 ENCOUNTER — Other Ambulatory Visit: Payer: Self-pay | Admitting: *Deleted

## 2011-05-13 MED ORDER — HYDROCHLOROTHIAZIDE 25 MG PO TABS: 25.0000 mg | ORAL_TABLET | Freq: Every day | ORAL | Status: DC

## 2011-05-17 ENCOUNTER — Encounter: Payer: Self-pay | Admitting: Family Medicine

## 2011-05-17 ENCOUNTER — Ambulatory Visit (INDEPENDENT_AMBULATORY_CARE_PROVIDER_SITE_OTHER): Payer: Medicaid Other | Admitting: Family Medicine

## 2011-05-17 DIAGNOSIS — M48062 Spinal stenosis, lumbar region with neurogenic claudication: Secondary | ICD-10-CM

## 2011-05-17 DIAGNOSIS — I10 Essential (primary) hypertension: Secondary | ICD-10-CM

## 2011-05-17 MED ORDER — HYDROCHLOROTHIAZIDE 25 MG PO TABS: 25.0000 mg | ORAL_TABLET | Freq: Every day | ORAL | Status: DC

## 2011-05-17 MED ORDER — OXYCODONE HCL 15 MG PO TABS: ORAL_TABLET | ORAL | Status: DC

## 2011-05-18 NOTE — Progress Notes (Signed)
Que is here for new prescriptions. Over the holidays he caught a ride with a friend. The friend had a tire blow out that caused a rash. Within the [employer] there rested his friend for driving without a license and some other offenses. They impounded the car and unfortunately his lu ggage which was in the car. contained his current supply of blood pressure medicine and his pain medicine. He had put the prescriptions for next month and next month after that in his home so they are still there he cannot feel them. He does not know what to do. Unfortunately, as they investigated who he was then discovered that he had a failure to appear warranted from 2009 and he was subsequently arrested. He brings all his paperwork with him so that I can see it.  He is also quite concerned that his blood pressure was so elevated when they arrested him. It was evidently 190/130 by his recollection. He has not had chest pain. He has had headache but is also been under a lot stress. He has been quite a bit of pain since he has not had his pain medications.  I reviewed the paperwork and it does seem that his story is real. It is quite an unfortunate event for him. I gave him a new prescription today for 2 weeks worth of his pain medicine which will carry him to the next prescription. I gave him a refill on his blood pressure medicine. I suspect he will need a combination blood pressure medicine ultimately given the reading he tells me about at the jail. I think we'll follow this up as planned on her last office visit. I have no concerns at [name] [name] is misusing his pain medication.

## 2011-07-26 ENCOUNTER — Ambulatory Visit (INDEPENDENT_AMBULATORY_CARE_PROVIDER_SITE_OTHER): Payer: Medicaid Other | Admitting: Family Medicine

## 2011-07-26 ENCOUNTER — Encounter: Payer: Self-pay | Admitting: Family Medicine

## 2011-07-26 VITALS — BP 144/100 | HR 92

## 2011-07-26 DIAGNOSIS — I10 Essential (primary) hypertension: Secondary | ICD-10-CM

## 2011-07-26 DIAGNOSIS — T50905A Adverse effect of unspecified drugs, medicaments and biological substances, initial encounter: Secondary | ICD-10-CM

## 2011-07-26 DIAGNOSIS — M25559 Pain in unspecified hip: Secondary | ICD-10-CM

## 2011-07-26 DIAGNOSIS — M48062 Spinal stenosis, lumbar region with neurogenic claudication: Secondary | ICD-10-CM

## 2011-07-26 DIAGNOSIS — T887XXA Unspecified adverse effect of drug or medicament, initial encounter: Secondary | ICD-10-CM

## 2011-07-26 MED ORDER — OXYCODONE HCL 30 MG PO TABS: ORAL_TABLET | ORAL | Status: DC

## 2011-07-26 MED ORDER — AMLODIPINE BESYLATE 5 MG PO TABS: 5.0000 mg | ORAL_TABLET | Freq: Every day | ORAL | Status: DC

## 2011-07-27 ENCOUNTER — Encounter: Payer: Self-pay | Admitting: Family Medicine

## 2011-07-27 NOTE — Progress Notes (Signed)
  Subjective:    Patient ID: [name] [name], male    DOB: 1955/05/23, 56 y.o.   MRN: [id number]  HPI  #1. Spinal stenosis with lower extremity, primarily right, pain. Has been well controlled up until about the last 2 months with his pain medications. He thinks it may be because she is trying to do more. He is actually looking for some part-time work. He has doubled up on his pills and therefore ran out of them early. He is unsure at this time whether or not he should return to [facility] for surgery now that he has some insurance perhaps she should see someone closer to home. He is quite nervous about the surgery. #2. Hypertension. His HCTZ is causing some erectile dysfunction so is only taking it intermittently.  Review of Systems    denies fever, sweats, chills. Objective:   Physical Exam  Vital signs reviewed GENERAL: Well-developed male no acute distress Back: Nontender to palpation or percussion. He has flexion at the hips to only about 70. His straight leg raise on the right is positive. His lower extremity strength is 5 out of 5 in hip flexors and extensors. His reflexes are 2+ bilaterally equal at the knee. His gait is antalgic. Interview: I reviewed these images of his MR spine once again. He has severe almost critical spinal stenosis with foraminal narrowing at L5-S1 on the right. He also has a disc fragment that is posterior to the spinal cord at the same level.      Assessment & Plan:  1. Significant spinal stenosis with L5-S1 the right foraminal narrowing. We talked about this a long time. I really think he is nervous about the surgery thinking it will be life threatening in someway. I agree to get him an appointment with someone closer to home now that he has insurance. #2 pain management. I reluctantly agreed to increase his pain medicine coverage. I think the answer is to have surgery. #3. Blood pressure medicine: We'll change him to amlodipine 5 mg. See him back in one  month.

## 2011-07-30 ENCOUNTER — Ambulatory Visit: Payer: Medicaid Other | Admitting: Family Medicine

## 2011-08-03 ENCOUNTER — Encounter: Payer: Self-pay | Admitting: *Deleted

## 2011-08-27 ENCOUNTER — Ambulatory Visit (INDEPENDENT_AMBULATORY_CARE_PROVIDER_SITE_OTHER): Payer: Medicaid Other | Admitting: Family Medicine

## 2011-08-27 VITALS — BP 145/99

## 2011-08-27 DIAGNOSIS — I10 Essential (primary) hypertension: Secondary | ICD-10-CM

## 2011-08-27 DIAGNOSIS — M48062 Spinal stenosis, lumbar region with neurogenic claudication: Secondary | ICD-10-CM

## 2011-08-27 MED ORDER — OXYCODONE HCL 30 MG PO TABS: ORAL_TABLET | ORAL | Status: DC

## 2011-08-29 ENCOUNTER — Encounter: Payer: Self-pay | Admitting: Family Medicine

## 2011-08-29 NOTE — Progress Notes (Signed)
  Subjective:    Patient ID: [name] [name], male    DOB: April 23, 1956, 56 y.o.   MRN: [id number]  HPI  1. F/u chronic neurogenic claudication from spinal stenosis with retained fragment posterior to spinal sord. Pain medication is not working as well for him as in beginning. Pain can be 6/10 to 8/10 even with pain meds at times. Mostly noted as right hip pain. No incontinence has appointment with [facility] for eval. 2. HTN: took his meds pretty regularly until 3 days ago when he ran out. Plans to refill them today, Denies SOB, chest pains and LE edema.  Review of Systems See hpi    Objective:   Physical Exam Vital signs reviewed. GENERAL: Well developed, well nourished, no acute distress LE strength 5/5 in hip flexion and extension. IR/ER both hips are without pain.and ROM is full. NV intact distally. \CV RRR       Assessment & Plan:  1. Neurogenic claudication / spinal stenosis--refilled pain meds and he will f/u after [facility] eval 2. HTN--he promises to take his meds every day--incl day of his next appt so we can get a better odea where his control is.I supect that ON medicine, he is pretty well controlled.nIf not, would add ACE to his amlodipine. He had some erectile dysfunction w HCTZ

## 2011-09-03 ENCOUNTER — Ambulatory Visit: Payer: Medicaid Other | Admitting: Family Medicine

## 2011-10-22 ENCOUNTER — Encounter: Payer: Self-pay | Admitting: Family Medicine

## 2011-10-22 ENCOUNTER — Ambulatory Visit (INDEPENDENT_AMBULATORY_CARE_PROVIDER_SITE_OTHER): Payer: Medicaid Other | Admitting: Family Medicine

## 2011-10-22 VITALS — BP 130/84

## 2011-10-22 DIAGNOSIS — M48062 Spinal stenosis, lumbar region with neurogenic claudication: Secondary | ICD-10-CM

## 2011-10-22 DIAGNOSIS — I10 Essential (primary) hypertension: Secondary | ICD-10-CM

## 2011-10-22 MED ORDER — OXYCODONE HCL 30 MG PO TABS: ORAL_TABLET | ORAL | Status: DC

## 2011-10-22 MED ORDER — AMLODIPINE BESYLATE 5 MG PO TABS: 5.0000 mg | ORAL_TABLET | Freq: Every day | ORAL | Status: DC

## 2011-10-22 NOTE — Assessment & Plan Note (Signed)
Much better when he is taking meds regularly Again discussed imprortance if that esp with upcoming surgery

## 2011-10-22 NOTE — Assessment & Plan Note (Signed)
Scheduled surgery 11/04/2011 Refills on his pain meds given F/u after surgery

## 2011-10-22 NOTE — Progress Notes (Signed)
  Subjective:    Patient ID: [name] [name], male    DOB: 1955-11-10, 56 y.o.   MRN: [id number]  HPI  F/u low back pain Surgery scheduled 11/04/2011 Dr Jay Schlichter at [facility] Pain is worsening so he is anxious to get surgery Also needs SCAT form filled out HTN F/U: Taking medicines regularly without problems. Denies chest pain, shortness of breath.   Review of Systems Continued hip and low back pain    Objective:   Physical Exam   Vital signs reviewed. GENERAL: Well developed, well nourished, no acute distress  GAIT antalgic CV RRR LUNGS CTA Images of his lumbar MRI were reviewed again with him and his family member who was present today for the first time     Assessment & Plan:

## 2011-10-29 ENCOUNTER — Ambulatory Visit: Payer: Medicaid Other | Admitting: Family Medicine

## 2011-11-01 ENCOUNTER — Ambulatory Visit: Payer: Medicaid Other | Admitting: Family Medicine

## 2011-11-02 ENCOUNTER — Other Ambulatory Visit: Payer: Self-pay | Admitting: Neurological Surgery

## 2011-11-02 ENCOUNTER — Other Ambulatory Visit (HOSPITAL_COMMUNITY): Payer: Self-pay | Admitting: Neurological Surgery

## 2011-11-02 DIAGNOSIS — M48061 Spinal stenosis, lumbar region without neurogenic claudication: Secondary | ICD-10-CM

## 2011-11-19 ENCOUNTER — Ambulatory Visit (HOSPITAL_COMMUNITY)
Admission: RE | Admit: 2011-11-19 | Discharge: 2011-11-19 | Disposition: A | Discharge status: Home / Self Care | Payer: Medicaid Other | Source: Ambulatory Visit | Attending: Neurological Surgery | Admitting: Neurological Surgery

## 2011-11-19 DIAGNOSIS — M48061 Spinal stenosis, lumbar region without neurogenic claudication: Secondary | ICD-10-CM

## 2011-11-19 DIAGNOSIS — M549 Dorsalgia, unspecified: Secondary | ICD-10-CM | POA: Insufficient documentation

## 2011-11-19 DIAGNOSIS — M79609 Pain in unspecified limb: Secondary | ICD-10-CM | POA: Insufficient documentation

## 2011-11-19 MED ORDER — OXYCODONE-ACETAMINOPHEN 5-325 MG PO TABS
1.0000 | ORAL_TABLET | ORAL | Status: DC | PRN
  Administered 2011-11-19: 2 via ORAL

## 2011-11-19 MED ORDER — OXYCODONE-ACETAMINOPHEN 5-325 MG PO TABS
ORAL_TABLET | ORAL | Status: AC
  Filled 2011-11-19: qty 2

## 2011-11-19 MED ORDER — DIAZEPAM 5 MG PO TABS
ORAL_TABLET | ORAL | Status: AC
  Filled 2011-11-19: qty 2

## 2011-11-19 MED ORDER — IOHEXOL 180 MG/ML  SOLN
14.0000 mL | Freq: Once | INTRAMUSCULAR | Status: AC | PRN
  Administered 2011-11-19: 14 mL via INTRATHECAL

## 2011-11-19 MED ORDER — DIAZEPAM 5 MG PO TABS
10.0000 mg | ORAL_TABLET | Freq: Once | ORAL | Status: AC
  Administered 2011-11-19: 10 mg via ORAL

## 2011-11-19 MED ORDER — ONDANSETRON HCL 4 MG/2ML IJ SOLN: 4.0000 mg | Freq: Four times a day (QID) | INTRAMUSCULAR | Status: DC | PRN

## 2011-11-19 NOTE — Procedures (Cosign Needed)
  [name] [name]. [name] [name]   DOB:  [birth year] #[id number]       October 27, 2011   CHIEF COMPLAINT:   Back and bilateral lower extremity pain, worse on the right side.   HISTORY OF PRESENT ILLNESS:  [name] [name] is a 56 year old individual who tells me that he has had substantial problems with his back and right leg.  He had surgery in the 1980s he believes and he has had continuous problems with back pain, worse in the recent past.    He brings with him an MRI from 2011 and this demonstrates that he has significant central canal stenosis at L3-4.  He also has some stenosis and spondylolisthesis at L4-5.  He complains mostly of that right hip hurting severely. He has been being seen by Dr. Jennette Kettle and he has been being prescribed Oxycodone 15 mg. three times a day for pain control.    PAST MEDICAL HISTORY:  Significant for hypertension.  His current medications include only the Oxycodone 15 mg. IR.  He tells me he has been disabled since 2008 because he cannot stand and walk.    SOCIAL HISTORY:    He smokes about a quarter pack a day.  He does not drink alcohol.  Height and weight have been stable at 6'1" and 198 lbs.    REVIEW OF SYSTEMS:   Notable for back pain, leg pain, joint pain and swelling, high blood pressure, and wearing of glasses on a 14-point review sheet.    PHYSICAL EXAMINATION:  He stands straight and erect.  He has motor function that is good in the iliopsoas, the quadriceps, tibialis anteiror and the gastrocnemii.  He has a 10-degree forward stoop in the neutral position.  His straight leg raising reproduces back pain mostly at 45 degrees, extending up to 80 degrees.  Patrick's maneuver is negative bilaterally. His deep tendon reflexes are absent in the patellae and the Achilles both.   IMPRESSION:    The patient has evidence of some significant spondylosis in the lower lumbar spine.  He had surgery years ago and he tells me he has been disabled since 2008.  I have suggested that we do a myelogram and  post-myelogram CT scan to better elucidate the nature of the stenosis and if so, have it decompressed surgically as necessary.  I noted to the patient that I am cautious and worried about his pain management because he has  been on some substantial doses of Oxycodone.  We will try to expedite the myelogram and post-myelogram CT scan.    Pre op Dx: Lumbar stenosis Post op Dx: Lumbar stenosis Procedure: Lumbar myelogram Surgeon: Keaghan Bowens Puncture level: L2-L3 Fluid color: Clear colorless Injection: 14 cc iohexol 180 Findings: Severe stenosis L3-L4 degenerative spondylosis,

## 2011-11-26 ENCOUNTER — Other Ambulatory Visit: Payer: Self-pay | Admitting: Family Medicine

## 2011-11-26 MED ORDER — AMLODIPINE BESYLATE 5 MG PO TABS: 5.0000 mg | ORAL_TABLET | Freq: Every day | ORAL | Status: DC

## 2011-11-26 MED ORDER — OXYCODONE HCL 30 MG PO TABS: ORAL_TABLET | ORAL | Status: DC

## 2011-12-06 ENCOUNTER — Other Ambulatory Visit: Payer: Self-pay | Admitting: Neurological Surgery

## 2011-12-06 ENCOUNTER — Encounter: Payer: Self-pay | Admitting: *Deleted

## 2011-12-06 NOTE — Progress Notes (Signed)
Patient ID: [name] [name], male   DOB: [birth year], 56 y.o.   MRN: [id number] FYI PER PT: surgery is schd for 8.23.13 at [facility].

## 2011-12-24 ENCOUNTER — Ambulatory Visit (INDEPENDENT_AMBULATORY_CARE_PROVIDER_SITE_OTHER): Payer: Medicaid Other | Admitting: Family Medicine

## 2011-12-24 ENCOUNTER — Encounter: Payer: Self-pay | Admitting: Family Medicine

## 2011-12-24 ENCOUNTER — Encounter (HOSPITAL_COMMUNITY)
Admission: RE | Admit: 2011-12-24 | Discharge: 2011-12-24 | Payer: Medicaid Other | Source: Ambulatory Visit | Attending: Neurological Surgery | Admitting: Neurological Surgery

## 2011-12-24 VITALS — BP 139/83 | HR 76 | Ht 73.0 in | Wt 201.0 lb

## 2011-12-24 DIAGNOSIS — M48062 Spinal stenosis, lumbar region with neurogenic claudication: Secondary | ICD-10-CM

## 2011-12-24 DIAGNOSIS — I10 Essential (primary) hypertension: Secondary | ICD-10-CM

## 2011-12-24 MED ORDER — OXYCODONE HCL 30 MG PO TABS: 30.0000 mg | ORAL_TABLET | Freq: Four times a day (QID) | ORAL | Status: DC | PRN

## 2011-12-24 NOTE — Pre-Procedure Instructions (Signed)
20 Alastair Hennes  12/24/2011   Your procedure is scheduled on:  12-31-2011  Report to [facility] at 5:30 AM.  Call this number if you have problems the morning of surgery: [phone]   Remember:   Do not eat food or drink:After Midnight.    Take these medicines the morning of surgery with A SIP OF WATER: amlodipine(Norvasc),pain medication as needed   Do not wear jewelry, make-up or nail polish.  Do not wear lotions, powders, or perfumes. You may wear deodorant.  Do not shave 48 hours prior to surgery. Men may shave face and neck.  Do not bring valuables to the hospital.  Contacts, dentures or bridgework may not be worn into surgery.  Leave suitcase in the car. After surgery it may be brought to your room.  For patients admitted to the hospital, checkout time is 11:00 AM the day of discharge.      Special Instructions: CHG Shower Use Special Wash: 1/2 bottle night before surgery and 1/2 bottle morning of surgery.   Please read over the following fact sheets that you were given: Pain Booklet, Coughing and Deep Breathing, Blood Transfusion Information, MRSA Information and Surgical Site Infection Prevention

## 2011-12-24 NOTE — Progress Notes (Signed)
  Subjective:    Patient ID: [name] [name], male    DOB: [birth year], 56 y.o.   MRN: [id number]  HPI  Followup spinal stenosis. He has had his neurosurgical evaluation and is scheduled for his procedure next week. He is quite nervous about it. He was to talk today about getting a prescription for walker which he will need postop. He is 32 gotten a prescription from his neurosurgeon for corset that he will need postop. He does need a refill his pain medicines. He wants to talk about what I think he can do as far as tapering off these after surgery. He would like to taper off as soon as possible as he does not like being on this medication, but he is also worried that he'll still have pain.  Review of Systems Denies unusual weight change. Continues to have radicular pain into his hip. No fever, sweats, chills.    Objective:   Physical Exam  Vital signs reviewed. GENERAL: Well developed, well nourished, no acute distress Cardiovascular regular rate and rhythm Llungs clear to auscultation ABDOMEN: Soft, positive bowel sounds EXTREMITY: Dorsalis pedis and radial pulses are 2+ bilaterally equal. BACK: No deformity. SKIN: No rash. Area of his planned procedure in the lumbar spine is without lesion.      Assessment & Plan:

## 2011-12-24 NOTE — Assessment & Plan Note (Signed)
Scheduled for surgery next week. I will continue take care of his pain medication. I do think that within a couple of months after surgery we can manage to get him off his pain medicine. Spell longtime with him, greater than 50% of our 40 minute office visit discussing upcoming surgery, risks, benefits, and is concerned. His surgical date is 823. Dr. Danielle Dess

## 2011-12-24 NOTE — Assessment & Plan Note (Signed)
Improvement since she's been on his medication regularly. We'll continue the amlodipine.

## 2011-12-29 ENCOUNTER — Encounter (HOSPITAL_COMMUNITY): Payer: Self-pay

## 2011-12-29 ENCOUNTER — Encounter (HOSPITAL_COMMUNITY)
Admission: RE | Admit: 2011-12-29 | Discharge: 2011-12-29 | Disposition: A | Discharge status: Home / Self Care | Payer: Medicaid Other | Source: Ambulatory Visit | Attending: Neurological Surgery | Admitting: Neurological Surgery

## 2011-12-29 ENCOUNTER — Ambulatory Visit (HOSPITAL_COMMUNITY)
Admission: RE | Admit: 2011-12-29 | Discharge: 2011-12-29 | Disposition: A | Discharge status: Home / Self Care | Payer: Medicaid Other | Source: Ambulatory Visit | Attending: Neurological Surgery | Admitting: Neurological Surgery

## 2011-12-29 DIAGNOSIS — Z01818 Encounter for other preprocedural examination: Secondary | ICD-10-CM | POA: Insufficient documentation

## 2011-12-29 DIAGNOSIS — Z01812 Encounter for preprocedural laboratory examination: Secondary | ICD-10-CM | POA: Insufficient documentation

## 2011-12-29 HISTORY — DX: Unspecified osteoarthritis, unspecified site: M19.90

## 2011-12-29 HISTORY — DX: Essential (primary) hypertension: I10

## 2011-12-29 LAB — TYPE AND SCREEN
ABO/RH(D): A POS
Antibody Screen: NEGATIVE

## 2011-12-29 LAB — CBC
HCT: 45 % (ref 39.0–52.0)
Hemoglobin: 15.2 g/dL (ref 13.0–17.0)
MCHC: 33.8 g/dL (ref 30.0–36.0)
RDW: 12.2 % (ref 11.5–15.5)
WBC: 7.7 10*3/uL (ref 4.0–10.5)

## 2011-12-29 LAB — BASIC METABOLIC PANEL
Chloride: 103 mEq/L (ref 96–112)
GFR calc Af Amer: 81 mL/min — ABNORMAL LOW (ref >90)
GFR calc non Af Amer: 70 mL/min — ABNORMAL LOW (ref >90)
Potassium: 4.1 mEq/L (ref 3.5–5.1)

## 2011-12-29 LAB — SURGICAL PCR SCREEN
MRSA, PCR: NEGATIVE
Staphylococcus aureus: NEGATIVE

## 2011-12-29 NOTE — Pre-Procedure Instructions (Signed)
20 [name]  12/29/2011   Your procedure is scheduled on:  Friday, August 23rd  Report to [facility] at 0530 AM.  Call this number if you have problems the morning of surgery: [phone]   Remember:   Do not eat food or drink:After Midnight.  Take these medicines the morning of surgery with A SIP OF WATER: Norvasc, oxycodone if need   Do not wear jewelry.  Do not wear lotions, powders, or perfumes.   Do not shave 48 hours prior to surgery. Men may shave face and neck.  Do not bring valuables to the hospital.  Contacts, dentures or bridgework may not be worn into surgery.  Leave suitcase in the car. After surgery it may be brought to your room.  For patients admitted to the hospital, checkout time is 11:00 AM the day of discharge.   Special Instructions: CHG Shower Use Special Wash: 1/2 bottle night before surgery and 1/2 bottle morning of surgery.   Please read over the following fact sheets that you were given: Pain Booklet, Coughing and Deep Breathing, Blood Transfusion Information, MRSA Information and Surgical Site Infection Prevention

## 2011-12-29 NOTE — Progress Notes (Signed)
Primary Physician - Dr. Burnard Leigh  Does not have a cardiologist. Has not had any type of cardiac work-up

## 2011-12-30 MED ORDER — CEFAZOLIN SODIUM-DEXTROSE 2-3 GM-% IV SOLR
2.0000 g | INTRAVENOUS | Status: AC
  Administered 2011-12-31: 2 g via INTRAVENOUS
  Filled 2011-12-30: qty 50

## 2011-12-30 NOTE — Consult Note (Signed)
Anesthesia chart review: This is a 56 year old male who is scheduled for lumbar spine surgery by Dr. Barnett Abu on Friday 31 December 2011.   Pre-op CXR dated 29 December 2011 shows no acute or active cardiopulmonary or pleural abnormalities.  Pre-op lab results dated 29 December 2011 are reviewed. Type and screen has been done.  Pre-op EKG dated 29 December 2011 and confirmed by Dr. Nanetta Batty is noted. The abnormal EKG was discussed with Dr. Noreene Larsson. Risk factors for coronary artery disease include hypertension and tobacco use.   May proceed with surgery as scheduled.   Kelton Pillar. Everhart, PA-C

## 2011-12-31 ENCOUNTER — Encounter (HOSPITAL_COMMUNITY)
Admission: RE | Disposition: A | Discharge status: Home / Self Care | Payer: Self-pay | Source: Ambulatory Visit | Attending: Neurological Surgery

## 2011-12-31 ENCOUNTER — Ambulatory Visit (HOSPITAL_COMMUNITY): Payer: Medicaid Other | Admitting: Anesthesiology

## 2011-12-31 ENCOUNTER — Ambulatory Visit (HOSPITAL_COMMUNITY): Payer: Medicaid Other

## 2011-12-31 ENCOUNTER — Inpatient Hospital Stay (HOSPITAL_COMMUNITY)
Admission: RE | Admit: 2011-12-31 | Discharge: 2012-01-03 | DRG: 460 | Disposition: A | Discharge status: Home / Self Care | Payer: Medicaid Other | Source: Ambulatory Visit | Attending: Neurological Surgery | Admitting: Neurological Surgery

## 2011-12-31 ENCOUNTER — Encounter (HOSPITAL_COMMUNITY): Payer: Self-pay | Admitting: Anesthesiology

## 2011-12-31 DIAGNOSIS — I10 Essential (primary) hypertension: Secondary | ICD-10-CM | POA: Diagnosis present

## 2011-12-31 DIAGNOSIS — M47817 Spondylosis without myelopathy or radiculopathy, lumbosacral region: Secondary | ICD-10-CM | POA: Diagnosis present

## 2011-12-31 DIAGNOSIS — B1920 Unspecified viral hepatitis C without hepatic coma: Secondary | ICD-10-CM | POA: Diagnosis present

## 2011-12-31 DIAGNOSIS — Y831 Surgical operation with implant of artificial internal device as the cause of abnormal reaction of the patient, or of later complication, without mention of misadventure at the time of the procedure: Secondary | ICD-10-CM | POA: Diagnosis present

## 2011-12-31 DIAGNOSIS — Y92009 Unspecified place in unspecified non-institutional (private) residence as the place of occurrence of the external cause: Secondary | ICD-10-CM

## 2011-12-31 DIAGNOSIS — M4180 Other forms of scoliosis, site unspecified: Secondary | ICD-10-CM | POA: Diagnosis present

## 2011-12-31 DIAGNOSIS — T84498A Other mechanical complication of other internal orthopedic devices, implants and grafts, initial encounter: Principal | ICD-10-CM | POA: Diagnosis present

## 2011-12-31 DIAGNOSIS — M4310 Spondylolisthesis, site unspecified: Secondary | ICD-10-CM | POA: Diagnosis present

## 2011-12-31 DIAGNOSIS — F17200 Nicotine dependence, unspecified, uncomplicated: Secondary | ICD-10-CM | POA: Diagnosis present

## 2011-12-31 DIAGNOSIS — M1610 Unilateral primary osteoarthritis, unspecified hip: Secondary | ICD-10-CM | POA: Diagnosis present

## 2011-12-31 DIAGNOSIS — M48062 Spinal stenosis, lumbar region with neurogenic claudication: Secondary | ICD-10-CM | POA: Diagnosis present

## 2011-12-31 SURGERY — POSTERIOR LUMBAR FUSION 3 LEVEL
Anesthesia: General | Site: Back | Wound class: Clean

## 2011-12-31 MED ORDER — HEMOSTATIC AGENTS (NO CHARGE) OPTIME
TOPICAL | Status: DC | PRN
  Administered 2011-12-31: 1 via TOPICAL

## 2011-12-31 MED ORDER — DROPERIDOL 2.5 MG/ML IJ SOLN: 0.6250 mg | INTRAMUSCULAR | Status: DC | PRN

## 2011-12-31 MED ORDER — CEFAZOLIN SODIUM 1-5 GM-% IV SOLN
INTRAVENOUS | Status: AC
  Administered 2011-12-31: 1 g via INTRAVENOUS
  Filled 2011-12-31: qty 50

## 2011-12-31 MED ORDER — FENTANYL CITRATE 0.05 MG/ML IJ SOLN
INTRAMUSCULAR | Status: DC | PRN
  Administered 2011-12-31 (×6): 50 ug via INTRAVENOUS
  Administered 2011-12-31: 75 ug via INTRAVENOUS
  Administered 2011-12-31: 50 ug via INTRAVENOUS
  Administered 2011-12-31: 25 ug via INTRAVENOUS
  Administered 2011-12-31 (×2): 50 ug via INTRAVENOUS
  Administered 2011-12-31: 100 ug via INTRAVENOUS
  Administered 2011-12-31: 50 ug via INTRAVENOUS

## 2011-12-31 MED ORDER — ROCURONIUM BROMIDE 100 MG/10ML IV SOLN
INTRAVENOUS | Status: DC | PRN
  Administered 2011-12-31: 10 mg via INTRAVENOUS
  Administered 2011-12-31: 50 mg via INTRAVENOUS
  Administered 2011-12-31: 7.5 mg via INTRAVENOUS
  Administered 2011-12-31 (×3): 10 mg via INTRAVENOUS
  Administered 2011-12-31: 5 mg via INTRAVENOUS
  Administered 2011-12-31 (×2): 10 mg via INTRAVENOUS

## 2011-12-31 MED ORDER — ONDANSETRON HCL 4 MG/2ML IJ SOLN
INTRAMUSCULAR | Status: DC | PRN
  Administered 2011-12-31: 4 mg via INTRAVENOUS

## 2011-12-31 MED ORDER — SODIUM CHLORIDE 0.9 % IV SOLN
INTRAVENOUS | Status: DC | PRN
  Administered 2011-12-31: 12:00:00 via INTRAVENOUS

## 2011-12-31 MED ORDER — MENTHOL 3 MG MT LOZG: 1.0000 | LOZENGE | OROMUCOSAL | Status: DC | PRN

## 2011-12-31 MED ORDER — ONDANSETRON HCL 4 MG/2ML IJ SOLN: 4.0000 mg | Freq: Four times a day (QID) | INTRAMUSCULAR | Status: DC | PRN

## 2011-12-31 MED ORDER — SODIUM CHLORIDE 0.9 % IV SOLN
INTRAVENOUS | Status: DC
  Administered 2012-01-01 – 2012-01-02 (×3): via INTRAVENOUS

## 2011-12-31 MED ORDER — SODIUM CHLORIDE 0.9 % IJ SOLN: 9.0000 mL | INTRAMUSCULAR | Status: DC | PRN

## 2011-12-31 MED ORDER — HEPARIN SODIUM (PORCINE) 1000 UNIT/ML IJ SOLN
INTRAMUSCULAR | Status: AC
  Filled 2011-12-31: qty 1

## 2011-12-31 MED ORDER — KETOROLAC TROMETHAMINE 30 MG/ML IJ SOLN
INTRAMUSCULAR | Status: AC
  Administered 2011-12-31: 30 mg via INTRAVENOUS
  Filled 2011-12-31: qty 1

## 2011-12-31 MED ORDER — HYDROMORPHONE 0.3 MG/ML IV SOLN
INTRAVENOUS | Status: DC
  Administered 2011-12-31: 1.8 mg via INTRAVENOUS
  Administered 2011-12-31: 16:00:00 via INTRAVENOUS
  Administered 2012-01-01: 3 mg via INTRAVENOUS
  Administered 2012-01-01: 2.7 mg via INTRAVENOUS
  Administered 2012-01-01: 03:00:00 via INTRAVENOUS
  Administered 2012-01-01: 2.1 mg via INTRAVENOUS
  Administered 2012-01-01: 3 mg via INTRAVENOUS
  Administered 2012-01-01: 3.6 mg via INTRAVENOUS
  Administered 2012-01-01: 12:00:00 via INTRAVENOUS
  Administered 2012-01-02: 2.7 mg via INTRAVENOUS
  Administered 2012-01-02: 10:00:00 via INTRAVENOUS
  Administered 2012-01-02: 4.6 mg via INTRAVENOUS
  Administered 2012-01-02: 1.5 mg via INTRAVENOUS
  Filled 2011-12-31 (×5): qty 25

## 2011-12-31 MED ORDER — SODIUM CHLORIDE 0.9 % IJ SOLN: 3.0000 mL | INTRAMUSCULAR | Status: DC | PRN

## 2011-12-31 MED ORDER — AMLODIPINE BESYLATE 5 MG PO TABS
5.0000 mg | ORAL_TABLET | Freq: Every day | ORAL | Status: DC
  Administered 2011-12-31 – 2012-01-03 (×3): 5 mg via ORAL
  Filled 2011-12-31 (×6): qty 1

## 2011-12-31 MED ORDER — 0.9 % SODIUM CHLORIDE (POUR BTL) OPTIME
TOPICAL | Status: DC | PRN
  Administered 2011-12-31: 1000 mL

## 2011-12-31 MED ORDER — OXYCODONE HCL 5 MG/5ML PO SOLN: 5.0000 mg | Freq: Once | ORAL | Status: DC | PRN

## 2011-12-31 MED ORDER — LACTATED RINGERS IV SOLN
INTRAVENOUS | Status: DC | PRN
  Administered 2011-12-31 (×4): via INTRAVENOUS

## 2011-12-31 MED ORDER — ACETAMINOPHEN 650 MG RE SUPP: 650.0000 mg | RECTAL | Status: DC | PRN

## 2011-12-31 MED ORDER — SODIUM CHLORIDE 0.9 % IV SOLN
10.0000 mg | INTRAVENOUS | Status: DC | PRN
  Administered 2011-12-31: 20 ug/min via INTRAVENOUS

## 2011-12-31 MED ORDER — NALOXONE HCL 0.4 MG/ML IJ SOLN: 0.4000 mg | INTRAMUSCULAR | Status: DC | PRN

## 2011-12-31 MED ORDER — SODIUM CHLORIDE 0.9 % IV SOLN: 250.0000 mL | INTRAVENOUS | Status: DC

## 2011-12-31 MED ORDER — DIPHENHYDRAMINE HCL 12.5 MG/5ML PO ELIX: 12.5000 mg | ORAL_SOLUTION | Freq: Four times a day (QID) | ORAL | Status: DC | PRN

## 2011-12-31 MED ORDER — SODIUM CHLORIDE 0.9 % IV SOLN
INTRAVENOUS | Status: AC
  Filled 2011-12-31: qty 500

## 2011-12-31 MED ORDER — CEFAZOLIN SODIUM 1-5 GM-% IV SOLN
1.0000 g | Freq: Three times a day (TID) | INTRAVENOUS | Status: AC
  Administered 2011-12-31 – 2012-01-01 (×2): 1 g via INTRAVENOUS
  Filled 2011-12-31 (×2): qty 50

## 2011-12-31 MED ORDER — LIDOCAINE HCL (CARDIAC) 20 MG/ML IV SOLN
INTRAVENOUS | Status: DC | PRN
  Administered 2011-12-31: 80 mg via INTRAVENOUS

## 2011-12-31 MED ORDER — THROMBIN 20000 UNITS EX KIT
PACK | CUTANEOUS | Status: DC | PRN
  Administered 2011-12-31: 20000 [IU] via TOPICAL

## 2011-12-31 MED ORDER — BACITRACIN 50000 UNITS IM SOLR
INTRAMUSCULAR | Status: AC
  Filled 2011-12-31: qty 1

## 2011-12-31 MED ORDER — ACETAMINOPHEN 325 MG PO TABS: 650.0000 mg | ORAL_TABLET | ORAL | Status: DC | PRN

## 2011-12-31 MED ORDER — HYDROMORPHONE HCL PF 1 MG/ML IJ SOLN
INTRAMUSCULAR | Status: AC
  Filled 2011-12-31: qty 1

## 2011-12-31 MED ORDER — HYDROMORPHONE HCL PF 1 MG/ML IJ SOLN
0.2500 mg | INTRAMUSCULAR | Status: DC | PRN
  Administered 2011-12-31: 0.5 mg via INTRAVENOUS

## 2011-12-31 MED ORDER — PHENOL 1.4 % MT LIQD: 1.0000 | OROMUCOSAL | Status: DC | PRN

## 2011-12-31 MED ORDER — SODIUM CHLORIDE 0.9 % IJ SOLN
3.0000 mL | Freq: Two times a day (BID) | INTRAMUSCULAR | Status: DC
  Administered 2012-01-02: 3 mL via INTRAVENOUS

## 2011-12-31 MED ORDER — KETOROLAC TROMETHAMINE 30 MG/ML IJ SOLN
30.0000 mg | Freq: Four times a day (QID) | INTRAMUSCULAR | Status: AC | PRN
  Administered 2011-12-31: 30 mg via INTRAVENOUS

## 2011-12-31 MED ORDER — ALUM & MAG HYDROXIDE-SIMETH 200-200-20 MG/5ML PO SUSP: 30.0000 mL | Freq: Four times a day (QID) | ORAL | Status: DC | PRN

## 2011-12-31 MED ORDER — DIPHENHYDRAMINE HCL 50 MG/ML IJ SOLN: 12.5000 mg | Freq: Four times a day (QID) | INTRAMUSCULAR | Status: DC | PRN

## 2011-12-31 MED ORDER — NEOSTIGMINE METHYLSULFATE 1 MG/ML IJ SOLN
INTRAMUSCULAR | Status: DC | PRN
  Administered 2011-12-31: 4 mg via INTRAVENOUS

## 2011-12-31 MED ORDER — BUPIVACAINE HCL (PF) 0.5 % IJ SOLN
INTRAMUSCULAR | Status: DC | PRN
  Administered 2011-12-31: 50 mL

## 2011-12-31 MED ORDER — HYDROMORPHONE 0.3 MG/ML IV SOLN
INTRAVENOUS | Status: AC
  Filled 2011-12-31: qty 25

## 2011-12-31 MED ORDER — MIDAZOLAM HCL 5 MG/5ML IJ SOLN
INTRAMUSCULAR | Status: DC | PRN
  Administered 2011-12-31: 2 mg via INTRAVENOUS

## 2011-12-31 MED ORDER — OXYCODONE HCL 5 MG PO TABS: 5.0000 mg | ORAL_TABLET | Freq: Once | ORAL | Status: DC | PRN

## 2011-12-31 MED ORDER — SODIUM CHLORIDE 0.9 % IR SOLN
Status: DC | PRN
  Administered 2011-12-31: 09:00:00

## 2011-12-31 MED ORDER — LIDOCAINE-EPINEPHRINE 1 %-1:100000 IJ SOLN
INTRAMUSCULAR | Status: DC | PRN
  Administered 2011-12-31: 20 mL

## 2011-12-31 MED ORDER — ONDANSETRON HCL 4 MG/2ML IJ SOLN
4.0000 mg | INTRAMUSCULAR | Status: DC | PRN
  Administered 2012-01-01: 4 mg via INTRAVENOUS
  Filled 2011-12-31: qty 2

## 2011-12-31 MED ORDER — PROPOFOL 10 MG/ML IV EMUL
INTRAVENOUS | Status: DC | PRN
  Administered 2011-12-31: 200 mg via INTRAVENOUS

## 2011-12-31 MED ORDER — GLYCOPYRROLATE 0.2 MG/ML IJ SOLN
INTRAMUSCULAR | Status: DC | PRN
  Administered 2011-12-31: .7 mg via INTRAVENOUS

## 2011-12-31 SURGICAL SUPPLY — 72 items
ADH SKN CLS APL DERMABOND .7 (GAUZE/BANDAGES/DRESSINGS) ×1
BAG DECANTER FOR FLEXI CONT (MISCELLANEOUS) ×2 IMPLANT
BLADE SURG ROTATE 9660 (MISCELLANEOUS) IMPLANT
BUR MATCHSTICK NEURO 3.0 LAGG (BURR) ×2 IMPLANT
CAGE 11MM (Cage) ×2 IMPLANT
CANISTER SUCTION 2500CC (MISCELLANEOUS) ×2 IMPLANT
CLOTH BEACON ORANGE TIMEOUT ST (SAFETY) ×2 IMPLANT
CONT SPEC 4OZ CLIKSEAL STRL BL (MISCELLANEOUS) ×4 IMPLANT
COVER BACK TABLE 24X17X13 BIG (DRAPES) IMPLANT
COVER TABLE BACK 60X90 (DRAPES) ×2 IMPLANT
CROSSLINK MEDIUM (Cage) ×1 IMPLANT
DECANTER SPIKE VIAL GLASS SM (MISCELLANEOUS) ×2 IMPLANT
DERMABOND ADVANCED (GAUZE/BANDAGES/DRESSINGS) ×1
DERMABOND ADVANCED .7 DNX12 (GAUZE/BANDAGES/DRESSINGS) ×1 IMPLANT
DRAPE C-ARM 42X72 X-RAY (DRAPES) ×4 IMPLANT
DRAPE LAPAROTOMY 100X72X124 (DRAPES) ×2 IMPLANT
DRAPE POUCH INSTRU U-SHP 10X18 (DRAPES) ×2 IMPLANT
DRAPE PROXIMA HALF (DRAPES) IMPLANT
DURAPREP 26ML APPLICATOR (WOUND CARE) ×2 IMPLANT
ELECT REM PT RETURN 9FT ADLT (ELECTROSURGICAL) ×2
ELECTRODE REM PT RTRN 9FT ADLT (ELECTROSURGICAL) ×1 IMPLANT
GAUZE SPONGE 4X4 16PLY XRAY LF (GAUZE/BANDAGES/DRESSINGS) IMPLANT
GLOVE BIOGEL PI IND STRL 6.5 (GLOVE) IMPLANT
GLOVE BIOGEL PI IND STRL 7.5 (GLOVE) IMPLANT
GLOVE BIOGEL PI IND STRL 8 (GLOVE) IMPLANT
GLOVE BIOGEL PI IND STRL 8.5 (GLOVE) ×2 IMPLANT
GLOVE BIOGEL PI INDICATOR 6.5 (GLOVE) ×1
GLOVE BIOGEL PI INDICATOR 7.5 (GLOVE) ×1
GLOVE BIOGEL PI INDICATOR 8 (GLOVE) ×2
GLOVE BIOGEL PI INDICATOR 8.5 (GLOVE) ×2
GLOVE ECLIPSE 7.5 STRL STRAW (GLOVE) ×2 IMPLANT
GLOVE ECLIPSE 8.5 STRL (GLOVE) ×4 IMPLANT
GLOVE EXAM NITRILE LRG STRL (GLOVE) IMPLANT
GLOVE EXAM NITRILE MD LF STRL (GLOVE) ×1 IMPLANT
GLOVE EXAM NITRILE XL STR (GLOVE) IMPLANT
GLOVE EXAM NITRILE XS STR PU (GLOVE) IMPLANT
GLOVE SKINSENSE NS SZ8.0 LF (GLOVE) ×4
GLOVE SKINSENSE STRL SZ8.0 LF (GLOVE) IMPLANT
GOWN BRE IMP SLV AUR LG STRL (GOWN DISPOSABLE) ×1 IMPLANT
GOWN BRE IMP SLV AUR XL STRL (GOWN DISPOSABLE) ×1 IMPLANT
GOWN STRL REIN 2XL LVL4 (GOWN DISPOSABLE) ×6 IMPLANT
KIT BASIN OR (CUSTOM PROCEDURE TRAY) ×2 IMPLANT
KIT ROOM TURNOVER OR (KITS) ×2 IMPLANT
NEEDLE HYPO 22GX1.5 SAFETY (NEEDLE) ×2 IMPLANT
NS IRRIG 1000ML POUR BTL (IV SOLUTION) ×2 IMPLANT
PACK FOAM VITOSS 10CC (Orthopedic Implant) ×2 IMPLANT
PACK LAMINECTOMY NEURO (CUSTOM PROCEDURE TRAY) ×2 IMPLANT
PAD ARMBOARD 7.5X6 YLW CONV (MISCELLANEOUS) ×6 IMPLANT
PATTIES SURGICAL .5 X1 (DISPOSABLE) ×2 IMPLANT
PATTIES SURGICAL 1X1 (DISPOSABLE) ×1 IMPLANT
PEEK PLIF NOVEL 9X25X12 (Peek) ×2 IMPLANT
PEEK PLIF NOVEL 9X25X8MM (Peek) ×2 IMPLANT
ROD 90MM (Rod) ×2 IMPLANT
SCREW 35MM PEDICLE (Screw) ×2 IMPLANT
SCREW PEDICLE 45MM (Screw) ×2 IMPLANT
SCREW POLYAX 6.5X45MM (Screw) ×1 IMPLANT
SCREW SET SPINAL STD HEXALOBE (Screw) ×6 IMPLANT
SPONGE GAUZE 4X4 12PLY (GAUZE/BANDAGES/DRESSINGS) ×2 IMPLANT
SPONGE LAP 4X18 X RAY DECT (DISPOSABLE) IMPLANT
SPONGE SURGIFOAM ABS GEL 100 (HEMOSTASIS) ×2 IMPLANT
SUT PROLENE 6 0 BV (SUTURE) ×2 IMPLANT
SUT VIC AB 1 CT1 18XBRD ANBCTR (SUTURE) ×1 IMPLANT
SUT VIC AB 1 CT1 8-18 (SUTURE) ×4
SUT VIC AB 2-0 CP2 18 (SUTURE) ×3 IMPLANT
SUT VIC AB 3-0 SH 8-18 (SUTURE) ×4 IMPLANT
SYR 20ML ECCENTRIC (SYRINGE) ×2 IMPLANT
TAPE CLOTH SURG 4X10 WHT LF (GAUZE/BANDAGES/DRESSINGS) ×1 IMPLANT
TOWEL OR 17X24 6PK STRL BLUE (TOWEL DISPOSABLE) ×2 IMPLANT
TOWEL OR 17X26 10 PK STRL BLUE (TOWEL DISPOSABLE) ×2 IMPLANT
TRAP SPECIMEN MUCOUS 40CC (MISCELLANEOUS) ×2 IMPLANT
TRAY FOLEY CATH 14FRSI W/METER (CATHETERS) ×2 IMPLANT
WATER STERILE IRR 1000ML POUR (IV SOLUTION) ×2 IMPLANT

## 2011-12-31 NOTE — Op Note (Signed)
Preoperative diagnosis: Lumbar spondylosis with radiculopathy and lumbar stenosis L3-4 L4-5 L5-S1. Status post previous decompression and fusion L4-L5 with pseudoarthrosis L4-L5 Postoperative diagnosis: Lumbar spondylosis with radiculopathy and lumbar stenosis L3-4 L4-5 and L5-S1. Status post previous decompression and fusion L4-5 with pseudoarthrosis L4-5 Procedure: Lumbar decompression via laminectomy of L3-L4 and L5 removal of pseudoarthrosis L4-L5 decompression of L4-L5 and S1 nerve roots and L3 nerve roots, posterior lumbar interbody arthrodesis using peek spacers autograft and allograft at L3-4 L4-5 and L5-S1, pedicle screw fixation L3-S1, posterior lateral arthrodesis with autograft and allograft L3-S1  Surgeon: Barnett Abu M.D. Assistant: Barbaraann Barthel M.D. Anesthesia: Gen. endotracheal Indications: Patient is a 56 year old individual who has had a previous surgery in 1980s which will was a lumbar decompression and a posterior fusion at L4-L5 the patient had developed worsening back pain and leg pain and had signs of an obvious pseudoarthrosis at L4-L5 he was deteriorating in level of function and a myelogram revealed the presence of a high-grade stenosis at the level of L4 L. L5 with additional spondylosis at L3-4 and L5-S1 in addition the patient was noted have a pseudomeningocele with spinal fluid leakage use advised regarding the need for surgical revision of his lumbar spine.  Procedure: The patient was brought to the operating room. After the smooth induction of general endotracheal anesthesia the patient was carefully turned to the prone position padding the bony prominences appropriately. The back was prepped with alcohol and DuraPrep and draped in a sterile fashion. An elliptical incision was created around his previous scar and this tissue was excised. Fascia was opened on either side of the midline to expose the spinous processes of the lower lumbar spine. These were identified as L3 and  L5 on the radiograph. The dissection was then completed to expose L3 L4-L5 and the pseudo-arthritic mass of bone overlying the laminar defect of L4-L5.Marland Kitchen    Decompressive laminotomies were then performed at L4-L5 L3-L4 and L5-S1 individually decompressing the L3 the L4 and the L5 and S1 nerve roots of severe stenosis from overgrown bone and redundant ligamentous material. The dissection in the region of L4-L5 was particularly tedious because of previous surgery and epidural fibrosis particularly along the right L4 nerve root. There was taken to protect the nerve roots and decompress them adequately far into the extraforaminal zone. The disc spaces were then individually isolated and discectomies were performed first at L4-L5 with a space was severely collapsed and there was large overgrown posterior osteophytes the osteophytes were removed. The disc space was entered and then by carefully dissecting and distracting the disc space ultimately 8 mm spacers could be placed within the disc space. Posterior interbody fusion was performed with peek spacers using a millimeters spacers allograft and autograft in and around the spacers. Once this was completed attention was turned to the L3-L4 were 10 mm spacers could be placed after doing a complete dissection and discectomy and evacuating the entirety of the disc space. Care was taken here to protect the L3 nerve root superiorly and the L4 nerve roots inferiorly. Attention was then turned to L5-S1 were the L5 nerve roots was identified superiorly and protected the disc space was cleaned and evacuated protecting the S1 nerve root medially. A complete discectomy was performed at L5-S1 and here a 12 mm peek spacers were placed into the interspace and the interspace was packed with autograft and allograft. The autograft was obtained from the laminar bone and the pseudo-arthritic bone that was removed during the decompression.  Pedicle entry sites were chosen at L3  bilaterally at L4 on the left side at L5 on the right side and S1 bilaterally. These were individually sounded and medial to lateral pedicle screws were placed in L3 these measured 5.5 x 35 mm in size 6.5 x 45 mm screws were placed in L4-L5 and the sacrum. Sacral screws were 6.5 x 45 mm in size. 90 mm precontoured rods were then fitted between the pedicle screws and provisionally tightened. A transverse connector was applied. Once the system was checked first positioning radiographically the system was torqued into final locking position.    The lateral gutters were packed with a mixture of autograft and allograft. The system was torqued into final locking position. Vital radiographs confirm good position of the hardware and stable alignment of the spine. Wound was irrigated copiously with antibiotic irrigating solution and then closed with #1 Vicryl in interrupted fashion 20 cc of half percent Marcaine was injected into the paraspinous fascia at the time of closure. Subcutaneous tissues was clipped were closed with 2-0 Vicryl and 3-0 Vicryl  is used to close subcuticular skin. Blood loss was estimated at 1000 cc Cell Saver blood 400 cc was returned to the patient.

## 2011-12-31 NOTE — Transfer of Care (Signed)
Immediate Anesthesia Transfer of Care Note  Patient: [name] [name]  Procedure(s) Performed: Procedure(s) (LRB): POSTERIOR LUMBAR FUSION 3 LEVEL (N/A)  Patient Location: PACU  Anesthesia Type: General  Level of Consciousness: awake, alert  and oriented  Airway & Oxygen Therapy: Patient Spontanous Breathing and Patient connected to face mask oxygen  Post-op Assessment: Report given to PACU RN and Post -op Vital signs reviewed and stable  Post vital signs: Reviewed and stable  Complications: No apparent anesthesia complications

## 2011-12-31 NOTE — Preoperative (Signed)
Beta Blockers   Reason not to administer Beta Blockers:Not Applicable 

## 2011-12-31 NOTE — Anesthesia Postprocedure Evaluation (Signed)
Anesthesia Post Note  Patient: [name] [name]  Procedure(s) Performed: Procedure(s) (LRB): POSTERIOR LUMBAR FUSION 3 LEVEL (N/A)  Anesthesia type: general  Patient location: PACU  Post pain: Pain level controlled  Post assessment: Patient's Cardiovascular Status Stable  Last Vitals:  Filed Vitals:   12/31/11 1615  BP: 136/82  Pulse: 67  Temp: 37.3 C  Resp: 18    Post vital signs: Reviewed and stable  Level of consciousness: sedated  Complications: No apparent anesthesia complications

## 2011-12-31 NOTE — H&P (Signed)
[name]. Azam  1610  DOB:  November 21, 1955      Salman had a myelogram of the lumbar spine completed on the 12th of July.  I had the opportunity to discuss the results with him.  Ezrael notes that the worst pain he gets is in the right hip and buttock and he tells me that he had a workup of his hip in the past.  This was in 2011, and indeed an MRI then showed that he had fairly normal architecture of the hip.  Because it has been a couple of years and his pain is so focused about the hip, I obtained an AP pelvis film.  One view was reviewed to examine for right hip pain.  The AP pelvis demonstrates that both hip joints are within normal limits.  He has a slight amount of arthritic change within the right hip joint itself.   Impression:  Mild arthropathy both hips.    As regards Luby's lumbar myelogram, I reviewed those findings.  He has a high grade block to the flow of dye across the levels of L4 and L5 where he has had a previous laminectomy.  It appears that he has had some sort of attempt at a fusion at this level with the bone laid dorsally which accentuates the stenosis at this time.  He has a functional disc space though markedly degenerated.  He has a slight anterolisthesis and loss of lordosis across his lumbar spine and he has marked degenerative changes at L3-4 causing a moderate central canal stenosis there and he has an anterolisthesis at L5-S1 causing lateral recess stenosis for the L5 nerve roots bilaterally.    I demonstrated the findings to [facility].  I noted that this would require a revision surgery at L4-L5 to decompress his spinal canal and would require decompression at L3, L4, L5 and S1 with pedicle screw fixation from L3 to S1.  This is indeed a substantial operation and has significant risks.  Already I can see on the myelogram that he has evidence of a pseudomeningocele just below L4-5.  This would have to be addressed and the dural leak would have to be sealed.  The patient has  evidence of significant spondylosis and stenosis on that right side.  He has no left-sided symptoms.    Dimitris notes that he cannot go on the way things are as he cannot function even with his activities of daily living with the pain that he has on the right side.  I noted to him that the risks are substantial.  There is a risk of spinal fluid leakage, there is a risk of paralysis with the surgery and all these things being considered he still wishes to proceed with the surgery and I am hopeful that we can improve his situation with the surgery but be truthful it does have substantial risks to him.  We will plan on scheduling the surgery for decompression arthrodesis L3 to S1 at his earliest convenience.   CHIEF COMPLAINT:   Back and bilateral lower extremity pain, worse on the right side.   HISTORY OF PRESENT ILLNESS:  Brady is a 56 year old individual who tells me that he has had substantial problems with his back and right leg.  He had surgery in the 1980s he believes and he has had continuous problems with back pain, worse in the recent past.    He brings with him an MRI from 2011 and this demonstrates that he has significant central canal  stenosis at L3-4.  He also has some stenosis and spondylolisthesis at L4-5.  He complains mostly of that right hip hurting severely. He has been being seen by Dr. Jennette Kettle and he has been being prescribed Oxycodone 15 mg. three times a day for pain control.    PAST MEDICAL HISTORY:  Significant for hypertension.  His current medications include only the Oxycodone 15 mg. IR.  He tells me he has been disabled since 2008 because he cannot stand and walk.    SOCIAL HISTORY:    He smokes about a quarter pack a day.  He does not drink alcohol.  Height and weight have been stable at 6'1" and 198 lbs.    REVIEW OF SYSTEMS:   Notable for back pain, leg pain, joint pain and swelling, high blood pressure, and wearing of glasses on a 14-point review sheet.    PHYSICAL  EXAMINATION:  He stands straight and erect.  He has motor function that is good in the iliopsoas, the quadriceps, tibialis anteiror and the gastrocnemii.  He has a 10-degree forward stoop in the neutral position.  His straight leg raising reproduces back pain mostly at 45 degrees, extending up to 80 degrees.  Patrick's maneuver is negative bilaterally. His deep tendon reflexes are absent in the patellae and the Achilles both.   IMPRESSION:    The patient has evidence of some significant spondylosis in the lower lumbar spine.  He had surgery years ago and he tells me he has been disabled since 2008.  I have suggested that we do a myelogram and post-myelogram CT scan to better elucidate the nature of the stenosis and if so, have it decompressed surgically as necessary.  I noted to the patient that I am cautious and worried about his pain management because he has   been on some substantial doses of Oxycodone.

## 2011-12-31 NOTE — Progress Notes (Signed)
Patient ID: [name] [name], male   DOB: 12-20-1955, 56 y.o.   MRN: [id number] Vital signs stable comfortable. Dressing clean and dry. Tolerating diet. Mobilize in a.m.

## 2011-12-31 NOTE — Anesthesia Preprocedure Evaluation (Addendum)
Anesthesia Evaluation  Patient identified by MRN, date of birth, ID band Patient awake    Reviewed: Allergy & Precautions, H&P , NPO status , Patient's Chart, lab work & pertinent test results  History of Anesthesia Complications Negative for: history of anesthetic complications  Airway Mallampati: I TM Distance: >3 FB Neck ROM: Full    Dental  (+) Poor Dentition and Dental Advisory Given   Pulmonary Current Smoker,  breath sounds clear to auscultation  Pulmonary exam normal       Cardiovascular hypertension, Pt. on medications Rhythm:Regular Rate:Normal     Neuro/Psych    GI/Hepatic negative GI ROS, (+) Hepatitis -, C  Endo/Other  negative endocrine ROS  Renal/GU negative Renal ROS     Musculoskeletal   Abdominal   Peds  Hematology   Anesthesia Other Findings   Reproductive/Obstetrics                           Anesthesia Physical Anesthesia Plan  ASA: III  Anesthesia Plan: General   Post-op Pain Management:    Induction: Intravenous  Airway Management Planned: Oral ETT  Additional Equipment:   Intra-op Plan:   Post-operative Plan: Extubation in OR  Informed Consent: I have reviewed the patients History and Physical, chart, labs and discussed the procedure including the risks, benefits and alternatives for the proposed anesthesia with the patient or authorized representative who has indicated his/her understanding and acceptance.   Dental advisory given  Plan Discussed with: CRNA, Anesthesiologist and Surgeon  Anesthesia Plan Comments:         Anesthesia Quick Evaluation

## 2011-12-31 NOTE — Plan of Care (Signed)
Problem: Phase I Progression Outcomes Goal: Voiding-avoid urinary catheter unless indicated Outcome: Not Met (add Reason) Foley Catheter to be d/ced POD#1

## 2012-01-01 NOTE — Plan of Care (Signed)
Problem: Phase II Progression Outcomes Goal: Progress activity as tolerated unless otherwise ordered Outcome: Completed/Met Date Met:  01/01/12 Patient ambulated with PT

## 2012-01-01 NOTE — Evaluation (Signed)
Occupational Therapy Evaluation Patient Details Name: [name] [name] MRN: [id number] DOB: Jun 08, 1955 Today's Date: 01/01/2012 Time: 4098-1191 OT Time Calculation (min): 29 min  OT Assessment / Plan / Recommendation Clinical Impression  Pt s/p posterior lumbar fusion and presents with pain and decreased awareness of precautions as well as brace management which limit pt's I with ADLs. Pt will benefit from skilled OT in the acute setting to maximize I with ADL and ADL mobility prior to d/c    OT Assessment  Patient needs continued OT Services    Follow Up Recommendations  Home health OT;Supervision/Assistance - 24 hour    Barriers to Discharge      Equipment Recommendations  Rolling walker with 5" wheels;3 in 1 bedside comode;Tub/shower seat    Recommendations for Other Services    Frequency  Min 2X/week    Precautions / Restrictions Precautions Precautions: Back Required Braces or Orthoses: Spinal Brace Spinal Brace: Lumbar corset (already on when PT arrived) Restrictions Weight Bearing Restrictions: No   Pertinent Vitals/Pain Pt reports 8/10 back pain- RN informed. Pt repositioned for pain relief.    ADL  Eating/Feeding: Performed;Independent Where Assessed - Eating/Feeding: Chair Grooming: Simulated;Wash/dry hands;Min guard Where Assessed - Grooming: Supported standing Upper Body Dressing: Performed;Minimal assistance Where Assessed - Upper Body Dressing: Unsupported sitting Lower Body Dressing: Performed;+1 Total assistance Where Assessed - Lower Body Dressing: Supported sit to Pharmacist, hospital: Mining engineer Method: Sit to Barista:  (from bed) Toileting - Clothing Manipulation and Hygiene: Simulated;Moderate assistance Where Assessed - Toileting Clothing Manipulation and Hygiene: Sit to stand from 3-in-1 or toilet Equipment Used: Back brace;Gait belt;Rolling walker Transfers/Ambulation Related to ADLs: pt  Min guard A with RW ambulation throughout room ADL Comments: Educated pt on need for DME (3n1, tub/shower seat) and possibly AE. Pt would like to wait to order equipment pending progress with therapy. Educated pt on AE- may need f/u. Pt will benefit from tub/shower transfer practice as well as review of AE     OT Diagnosis: Generalized weakness;Acute pain  OT Problem List: Decreased activity tolerance;Impaired balance (sitting and/or standing);Decreased knowledge of use of DME or AE;Decreased knowledge of precautions;Pain OT Treatment Interventions: Self-care/ADL training;DME and/or AE instruction;Therapeutic activities;Patient/family education;Balance training   OT Goals Acute Rehab OT Goals OT Goal Formulation: With patient Time For Goal Achievement: 01/08/12 Potential to Achieve Goals: Good ADL Goals Pt Will Perform Grooming: with modified independence;Standing at sink ADL Goal: Grooming - Progress: Goal set today Pt Will Perform Upper Body Bathing: with set-up;with supervision;Sit to stand in shower ADL Goal: Upper Body Bathing - Progress: Goal set today Pt Will Perform Lower Body Bathing: with set-up;with supervision;Sit to stand in shower;with adaptive equipment ADL Goal: Lower Body Bathing - Progress: Goal set today Pt Will Perform Upper Body Dressing: Independently;Sitting, chair;Sitting, bed ADL Goal: Upper Body Dressing - Progress: Goal set today Pt Will Perform Lower Body Dressing: with min assist;Sit to stand from bed;Sit to stand from chair;with adaptive equipment ADL Goal: Lower Body Dressing - Progress: Goal set today Pt Will Transfer to Toilet: with modified independence;Ambulation;with DME ADL Goal: Toilet Transfer - Progress: Goal set today Pt Will Perform Toileting - Hygiene: with modified independence;with adaptive equipment ADL Goal: Toileting - Hygiene - Progress: Goal set today Pt Will Perform Tub/Shower Transfer: Tub transfer;with supervision;with  set-up;Ambulation;with DME ADL Goal: Tub/Shower Transfer - Progress: Goal set today Additional ADL Goal #1: Pt will I'ly verbalize/generalize 3/3 back precautions for use with functional activities. ADL Goal: Additional Goal #  1 - Progress: Goal set today Additional ADL Goal #2: Pt will don/doff brace sitting EOB I'ly. ADL Goal: Additional Goal #2 - Progress: Goal set today  Visit Information  Last OT Received On: 01/01/12 Assistance Needed: +1    Subjective Data  Subjective: Is it hot in here to you? Patient Stated Goal: Would like to be able to play with children in neighborhood and at church   Prior Functioning  Vision/Perception  Home Living Lives With: Alone Available Help at Discharge: Friend(s);Available 24 hours/day Type of Home: Apartment Home Access: Level entry Home Layout: One level Bathroom Shower/Tub: Tub/shower unit;Curtain Firefighter: Standard Home Adaptive Equipment: None Prior Function Level of Independence: Independent Able to Take Stairs?: Reciprically Driving: Yes Vocation: On disability Communication Communication: No difficulties Dominant Hand: Right      Cognition  Overall Cognitive Status: Appears within functional limits for tasks assessed/performed Arousal/Alertness: Awake/alert Orientation Level: Appears intact for tasks assessed Behavior During Session: [facility] for tasks performed    Extremity/Trunk Assessment Right Upper Extremity Assessment RUE ROM/Strength/Tone: Within functional levels RUE Sensation: WFL - Light Touch RUE Coordination: WFL - gross/fine motor Left Upper Extremity Assessment LUE ROM/Strength/Tone: Within functional levels LUE Sensation: WFL - Light Touch LUE Coordination: WFL - gross/fine motor Right Lower Extremity Assessment RLE ROM/Strength/Tone: WFL for tasks assessed Left Lower Extremity Assessment LLE ROM/Strength/Tone: WFL for tasks assessed   Mobility Bed Mobility Bed Mobility: Rolling Left;Left Sidelying  to Sit Rolling Right: 6: Modified independent (Device/Increase time) Rolling Left: With rail;4: Min assist Left Sidelying to Sit: 4: Min assist;With rails;HOB flat Sit to Sidelying Left: 4: Min assist Details for Bed Mobility Assistance: VC for sequencing. Assist to achieve upright sitting  Transfers Sit to Stand: 4: Min assist;From bed Stand to Sit: 4: Min guard;With armrests;To chair/3-in-1 Details for Transfer Assistance: VC for hand placement. Assist to achieve full upright standing from bed   Exercise    Balance    End of Session OT - End of Session Equipment Utilized During Treatment: Gait belt;Back brace Activity Tolerance: Patient limited by fatigue Patient left: in chair;with call bell/phone within reach Nurse Communication: Mobility status  GO     Jules Baty 01/01/2012, 11:54 AM

## 2012-01-01 NOTE — Progress Notes (Signed)
Patient ID: [name] [name], male   DOB: 04-25-1956, 56 y.o.   MRN: [id number] BP 104/55  Pulse 72  Temp 98.3 F (36.8 C) (Oral)  Resp 14  SpO2 98% Alert and oriented x 4 5/5 strength in lower extremities Dressing is clean and dry Continue PCA Continue PT today.  Doing well

## 2012-01-01 NOTE — Evaluation (Signed)
Physical Therapy Evaluation Patient Details Name: [name] [name] MRN: [id number] DOB: Nov 10, 1955 Today's Date: 01/01/2012 Time: 4098-1191 PT Time Calculation (min): 17 min  PT Assessment / Plan / Recommendation Clinical Impression  Patient s/p PLIF L3 - S1 with decompression.  patient doing fairly well with mobility, mostly limited by pain.  Feel that as pain controlled, patient will easily achieve modified independent with mobilty.  Will benefit from PT to achieve max potential for mobility and education.    PT Assessment  Patient needs continued PT services    Follow Up Recommendations  Outpatient PT (OPPT when cleared by MD)    Barriers to Discharge        Equipment Recommendations  Rolling walker with 5" wheels    Recommendations for Other Services     Frequency Min 5X/week    Precautions / Restrictions Precautions Precautions: Back Required Braces or Orthoses: Spinal Brace Spinal Brace: Lumbar corset (already on when PT arrived) Restrictions Weight Bearing Restrictions: No   Pertinent Vitals/Pain Pain 7/10 during ambulation/mobility; PCA encouraged, pain decreased to 5/10 at end of treatment.      Mobility  Bed Mobility Bed Mobility: Sit to Sidelying Left;Rolling Right Rolling Right: 6: Modified independent (Device/Increase time) Sit to Sidelying Left: 4: Min assist Details for Bed Mobility Assistance: min assist for LE's Transfers Transfers: Sit to Stand;Stand to Sit Sit to Stand: 4: Min guard Stand to Sit: 4: Min guard Details for Transfer Assistance: min guard for safety Ambulation/Gait Ambulation/Gait Assistance: 4: Min guard Ambulation Distance (Feet): 15 Feet Assistive device: Rolling walker Ambulation/Gait Assistance Details: min guard for safety Gait Pattern: Step-through pattern Gait velocity: slow    Exercises     PT Diagnosis: Difficulty walking;Acute pain  PT Problem List: Decreased activity tolerance;Decreased mobility;Decreased knowledge of  use of DME;Pain PT Treatment Interventions: DME instruction;Gait training;Stair training;Functional mobility training;Patient/family education   PT Goals Acute Rehab PT Goals PT Goal Formulation: With patient Time For Goal Achievement: 01/08/12 Potential to Achieve Goals: Good Pt will go Supine/Side to Sit: Independently;with HOB 0 degrees PT Goal: Supine/Side to Sit - Progress: Goal set today Pt will go Sit to Supine/Side: Independently;with HOB 0 degrees PT Goal: Sit to Supine/Side - Progress: Goal set today Pt will go Sit to Stand: with modified independence;with upper extremity assist PT Goal: Sit to Stand - Progress: Goal set today Pt will go Stand to Sit: with modified independence;with upper extremity assist PT Goal: Stand to Sit - Progress: Goal set today Pt will Ambulate: 51 - 150 feet;with modified independence;with rolling walker PT Goal: Ambulate - Progress: Goal set today  Visit Information  Last PT Received On: 01/01/12 Assistance Needed: +1    Subjective Data  Subjective: Patient complained of pain and discomfort; ready to get back to bed Patient Stated Goal: get around as good as my mother   Prior Functioning  Home Living Lives With: Alone Available Help at Discharge: Friend(s);Available 24 hours/day Type of Home: Apartment Home Access: Level entry Home Layout: One level Bathroom Shower/Tub: Tub/shower unit;Curtain Firefighter: Standard Home Adaptive Equipment: None Prior Function Level of Independence: Independent Able to Take Stairs?: Reciprically Driving: Yes Vocation: On disability Communication Communication: No difficulties Dominant Hand: Right    Cognition  Overall Cognitive Status: Appears within functional limits for tasks assessed/performed Arousal/Alertness: Awake/alert Orientation Level: Appears intact for tasks assessed Behavior During Session: [facility] for tasks performed    Extremity/Trunk Assessment Right Lower Extremity  Assessment RLE ROM/Strength/Tone: [facility] for tasks assessed Left Lower Extremity Assessment LLE  ROM/Strength/Tone: [facility] for tasks assessed   Balance    End of Session PT - End of Session Equipment Utilized During Treatment: Gait belt Activity Tolerance: Patient limited by pain Patient left: in bed;with call bell/phone within reach  GP     Olivia Canter, [place] [phone] 01/01/2012, 11:23 AM

## 2012-01-01 NOTE — Progress Notes (Signed)
CSW consult for SNF. PT recommendation for OutptPT noted. CSW signing off as no other CSW needs identified at this time. Please re-consult if SNF needed.  Dellie Burns, MSW, LCSWA [phone] (Weekends 8:00am-4:30pm)

## 2012-01-02 MED ORDER — MORPHINE SULFATE 2 MG/ML IJ SOLN
2.0000 mg | INTRAMUSCULAR | Status: DC | PRN
  Administered 2012-01-02 (×2): 2 mg via INTRAVENOUS
  Filled 2012-01-02 (×2): qty 1

## 2012-01-02 MED ORDER — OXYCODONE-ACETAMINOPHEN 5-325 MG PO TABS
1.0000 | ORAL_TABLET | ORAL | Status: DC | PRN
  Administered 2012-01-02 – 2012-01-03 (×5): 2 via ORAL
  Filled 2012-01-02 (×5): qty 2

## 2012-01-02 NOTE — Progress Notes (Signed)
Patient ID: [name] [name], male   DOB: [birth year], 56 y.o.   MRN: [id number] BP 115/60  Pulse 74  Temp 98.3 F (36.8 C) (Oral)  Resp 18  SpO2 100% Normal strength in lower extremities Wound is clean, no signs of infection Dc pca

## 2012-01-02 NOTE — Progress Notes (Signed)
Occupational Therapy Treatment Patient Details Name: [name] [name] MRN: [id number] DOB: Nov 06, 1955 Today's Date: 01/02/2012 Time: 9147-8295 OT Time Calculation (min): 15 min  OT Assessment / Plan / Recommendation Comments on Treatment Session Pt's overall functional mobility is good.  Will benefit from tub transfer education next session.    Follow Up Recommendations  Home health OT;Supervision/Assistance - 24 hour    Barriers to Discharge       Equipment Recommendations  Rolling walker with 5" wheels;Tub/shower seat    Recommendations for Other Services    Frequency Min 2X/week   Plan Discharge plan remains appropriate    Precautions / Restrictions Precautions Precautions: Back Precaution Comments: Pt able to independently verbalize 3/3 back precautions. Required Braces or Orthoses: Spinal Brace Spinal Brace: Lumbar corset Restrictions Weight Bearing Restrictions: No   Pertinent Vitals/Pain See vitals    ADL  Toilet Transfer: Simulated;Supervision/safety Toilet Transfer Method:  (ambulating) Equipment Used: Back brace;Rolling walker Transfers/Ambulation Related to ADLs: supervision with RW ADL Comments: Pt attempting to push IV pole out of room without RW unsupervised when OT was walking by room. Pt reporting he was just going to go for a walk by himself.  OT ambulated with pt in hallway with supervision with RW.  While ambulating, provided education on performing LB bathing and dressing while maintaining back precautions. Pt reports he donned his pants this morning by sitting in chair and using a butter knife as a "reacher".    OT Diagnosis:    OT Problem List:   OT Treatment Interventions:     OT Goals ADL Goals Pt Will Transfer to Toilet: with modified independence;Ambulation;with DME ADL Goal: Toilet Transfer - Progress: Progressing toward goals Additional ADL Goal #1: Pt will I'ly verbalize/generalize 3/3 back precautions for use with functional activities. ADL  Goal: Additional Goal #1 - Progress: Progressing toward goals  Visit Information  Last OT Received On: 01/02/12 Assistance Needed: +1    Subjective Data      Prior Functioning       Cognition  Overall Cognitive Status: Appears within functional limits for tasks assessed/performed Arousal/Alertness: Awake/alert Orientation Level: Appears intact for tasks assessed Behavior During Session: [facility] for tasks performed Cognition - Other Comments: Slightly decreased safety awareness since pt was attempting to leave room unsupervised.  However, pt demonstrated good safety awareness during mobility with therapist.    Mobility Bed Mobility Bed Mobility: Sit to Sidelying Left Sit to Sidelying Left: 4: Min assist Details for Bed Mobility Assistance: Assist for LEs into bed. Transfers Transfers: Stand to Sit Stand to Sit: 5: Supervision;To bed;With upper extremity assist Details for Transfer Assistance: independently demonstrated correct hand placement.  Pt standing in room upon OT arrival.   Exercises    Balance    End of Session OT - End of Session Equipment Utilized During Treatment: Back brace Activity Tolerance: Patient tolerated treatment well Patient left: in bed;with call bell/phone within reach;with bed alarm set Nurse Communication: Mobility status  GO    01/02/2012 Cipriano Mile OTR/L Pager [phone] Office [phone]  Cipriano Mile 01/02/2012, 3:49 PM

## 2012-01-02 NOTE — Progress Notes (Signed)
PT Cancellation Note  Treatment cancelled today due to patient's refusal to participate. Pt stated he has been up walking in his room all day today and doesn't feel up to therapy today. Agreeable to therapy follow-up tomorrow.  Sallyanne Kuster 01/02/2012, 12:28 PM  Sallyanne Kuster, PTA Office- [phone]

## 2012-01-02 NOTE — Progress Notes (Signed)
OT Cancellation Note  Treatment cancelled today due to patient's refusal to participate.  Pt reports he has been up this morning and has just returned to bed. Unlikely that pt will go home today. Will re-attempt later today if pt plans to d/c today. Otherwise will f/u tomorrow.   01/02/2012 Cipriano Mile OTR/L Pager [phone] Office [phone]

## 2012-01-03 ENCOUNTER — Encounter (HOSPITAL_COMMUNITY): Payer: Self-pay | Admitting: General Practice

## 2012-01-03 LAB — POCT I-STAT 4, (NA,K, GLUC, HGB,HCT)
Glucose, Bld: 111 mg/dL — ABNORMAL HIGH (ref 70–99)
Potassium: 4.3 mEq/L (ref 3.5–5.1)
Sodium: 138 mEq/L (ref 135–145)

## 2012-01-03 MED ORDER — OXYCODONE-ACETAMINOPHEN 5-325 MG PO TABS: 1.0000 | ORAL_TABLET | ORAL | Status: AC | PRN

## 2012-01-03 MED ORDER — DIAZEPAM 5 MG PO TABS: 5.0000 mg | ORAL_TABLET | Freq: Four times a day (QID) | ORAL | Status: AC | PRN

## 2012-01-03 NOTE — Progress Notes (Signed)
CARE MANAGEMENT NOTE 01/03/2012  Patient:  [name],[name]   Account Number:  [id number]  Date Initiated:  01/03/2012  Documentation initiated by:  Vance Peper  Subjective/Objective Assessment:   56 yr old male s/p L3-S1 fusion     Action/Plan:   Anticipated DC Date:  01/03/2012   Anticipated DC Plan:  HOME/SELF CARE      DC Planning Services  CM consult      PAC Choice  DURABLE MEDICAL EQUIPMENT   Choice offered to / List presented to:  NA   DME arranged  Levan Hurst      DME agency  [facility]        Status of service:  Completed, signed off Medicare Important Message given?   (If response is "NO", the following Medicare IM given date fields will be blank) Date Medicare IM given:   Date Additional Medicare IM given:    Discharge Disposition:  HOME/SELF CARE  Per UR Regulation:    If discussed at Long Length of Stay Meetings, dates discussed:    Comments:

## 2012-01-03 NOTE — Discharge Summary (Signed)
Physician Discharge Summary  Patient ID: Elihu Milstein MRN: [id number] DOB/AGE: 54/30/1957 56 y.o.  Admit date: 12/31/2011 Discharge date: 01/03/2012  Admission Diagnoses: Lumbar spondylosis and stenosis  L3-4 L4-5 L5-S1, degenerative scoliosis  Discharge Diagnoses: Lumbar spondylosis and stenosis L3-4 L4-5 and L5-S1, degenerative scoliosis, pseudoarthrosis from previous laminectomy and fusion 1980s Principal Problem:  *Spinal stenosis, lumbar region, with neurogenic claudication   Discharged Condition: good  Hospital Course: Patient is a 56 year old visual who was evaluating for back pain and leg she's had for years he's been treated with high-dose narcotic looks. He has advanced spondylosis and stenosis at L 34 L4-5 and L5-S1. He has evidence of a pseudoarthrosis. He is advised regarding surgery. He tolerated the surgery well. His pain is much improved. He is ambulatory and his incision is clean and dry.  Consults: None  Significant Diagnostic Studies: MRI lumbar spine demonstrating severe spondylosis and stenosis L3-4 L4-5 L5-S1  Treatments: Decompression L3-4 L4-5 and L5-S1 takedown of pseudoarthrosis L4-L5 posterior interbody arthrodesis L3-4 L4-5 L5-S1 with peek spacers local autograft and allograft, segmental fixation L3-S1 pedicle screws posterior lateral arthrodesis with local autograft and allograft L3-S1  Discharge Exam: Blood pressure 122/68, pulse 79, temperature 99.2 F (37.3 C), temperature source Oral, resp. rate 18, SpO2 97.00%. Incision is clean and dry motor function lower extremities reveals good strength in iliopsoas quadriceps tibialis anterior and gastrocs normal tone and bulk is noted.  Disposition: Discharge home  Discharge Orders    Future Appointments: Provider: Department: Dept Phone: Center:   02/04/2012 8:45 AM Nestor Ramp, MD [facility] [phone] [facility]     Medication List  As of 01/03/2012  2:16 PM   TAKE these medications        amLODipine 5 MG tablet   Commonly known as: NORVASC   Take 1 tablet (5 mg total) by mouth daily.      diazepam 5 MG tablet   Commonly known as: VALIUM   Take 1 tablet (5 mg total) by mouth every 6 (six) hours as needed (Muscle spasm).      oxycodone 30 MG immediate release tablet   Commonly known as: ROXICODONE   Take 1 tablet (30 mg total) by mouth every 6 (six) hours as needed. For back and hip pain      oxyCODONE-acetaminophen 5-325 MG per tablet   Commonly known as: PERCOCET/ROXICET   Take 1-2 tablets by mouth every 4 (four) hours as needed for pain.             SignedStefani Dama 01/03/2012, 2:16 PM

## 2012-01-03 NOTE — Progress Notes (Signed)
Clinical Social Work Department BRIEF PSYCHOSOCIAL ASSESSMENT 01/03/2012  Patient:  Barbato,Maddax     Account Number:  [id number]     Admit date:  12/31/2011  Clinical Social Worker:  Dennison Bulla  Date/Time:  01/03/2012 03:00 PM  Referred by:  Physician  Date Referred:  01/03/2012 Referred for  Transportation assistance   Other Referral:   Interview type:  Patient Other interview type:    PSYCHOSOCIAL DATA Living Status:  ALONE Admitted from facility:   Level of care:   Primary support name:  [place] Primary support relationship to patient:  FRIEND Degree of support available:   Unknown at this time    CURRENT CONCERNS Current Concerns  Other - See comment   Other Concerns:   Transportation    SOCIAL WORK ASSESSMENT / PLAN CSW received referral to assist with transportation. CSW reviewed chart and met with patient at bedside.    CSW introduced myself and explained role. Patient reported that he usually uses Medicaid transportation and needed assistance because he did not have a ride. CSW confirmed address and that patient had a key. CSW explained process. Patient reported that he wanted to be picked up at short stay. CSW informed RN that she would call and tell her pick up time.    CSW called [facility] and got a pick up for 1505 at short stay. When CSW called RN, RN reported that patient had already called volunteers and dc from hospital. Patient was in room when CSW and RN discussed dc plans with him and was agreeable when asking for CSW assistance.   Assessment/plan status:  No Further Intervention Required Other assessment/ plan:   Information/referral to community resources:   [facility]    PATIENT'S/FAMILY'S RESPONSE TO PLAN OF CARE: Patient was engaged in assessment and agreeable to CSW assistance.

## 2012-01-03 NOTE — Progress Notes (Signed)
Occupational Therapy Treatment Patient Details Name: [name] [name] MRN: [id number] DOB: 21-Jul-1955 Today's Date: 01/03/2012 Time: 4098-1191 OT Time Calculation (min): 19 min  OT Assessment / Plan / Recommendation Comments on Treatment Session Pt states he is ready to go home.  All education completed.  Pt will have assist of friends upon d/c.    Follow Up Recommendations  Home health OT;Supervision/Assistance - 24 hour    Barriers to Discharge       Equipment Recommendations  Rolling walker with 5" wheels    Recommendations for Other Services    Frequency     Plan Discharge plan remains appropriate    Precautions / Restrictions Precautions Precautions: Back Precaution Comments: Pt adheres and verbalizes back precautions. Required Braces or Orthoses: Spinal Brace Spinal Brace: Lumbar corset Restrictions Weight Bearing Restrictions: No   Pertinent Vitals/Pain     ADL  Grooming: Performed;Modified independent Where Assessed - Grooming: Unsupported standing Lower Body Bathing: Simulated;Supervision/safety (instructed in use of long sponge and issued) Where Assessed - Lower Body Bathing: Supported sit to stand Lower Body Dressing: Performed;Supervision/safety Where Assessed - Lower Body Dressing: Unsupported sitting Toilet Transfer: Performed;Other (comment);Modified independent (pt stood to urinate) Tub/Shower Transfer: Landscape architect Method: Ambulating Equipment Used: Back brace;Rolling walker;Reacher;Long-handled sponge Transfers/Ambulation Related to ADLs: supervision with RW ADL Comments: Pt is independent in back precautions, donning back brace, and is knowledgeable in use of AE for LB ADL.  Issued long sponge and reacher and educated in use.  Pt declined sock aid and long shoe horn.      OT Diagnosis:    OT Problem List:   OT Treatment Interventions:     OT Goals Acute Rehab OT Goals OT Goal Formulation: With patient ADL  Goals Pt Will Perform Grooming: with modified independence;Standing at sink ADL Goal: Grooming - Progress: Met Pt Will Perform Lower Body Bathing: with set-up;with supervision;Sit to stand in shower;with adaptive equipment ADL Goal: Lower Body Bathing - Progress: Met Pt Will Perform Upper Body Dressing: Independently;Sitting, chair;Sitting, bed Pt Will Perform Lower Body Dressing: with min assist;Sit to stand from bed;Sit to stand from chair;with adaptive equipment ADL Goal: Lower Body Dressing - Progress: Met Pt Will Transfer to Toilet: with modified independence;Ambulation;with DME ADL Goal: Toilet Transfer - Progress: Progressing toward goals Pt Will Perform Toileting - Hygiene: with modified independence;with adaptive equipment Pt Will Perform Tub/Shower Transfer: Tub transfer;with supervision;with set-up;Ambulation;with DME ADL Goal: Tub/Shower Transfer - Progress: Met Additional ADL Goal #1: Pt will I'ly verbalize/generalize 3/3 back precautions for use with functional activities. ADL Goal: Additional Goal #1 - Progress: Met Additional ADL Goal #2: Pt will don/doff brace sitting EOB I'ly. ADL Goal: Additional Goal #2 - Progress: Met  Visit Information  Last OT Received On: 01/03/12 Assistance Needed: +1    Subjective Data      Prior Functioning       Cognition  Overall Cognitive Status: Appears within functional limits for tasks assessed/performed Arousal/Alertness: Awake/alert Orientation Level: Appears intact for tasks assessed Behavior During Session: [facility] for tasks performed    Mobility Bed Mobility Bed Mobility: Rolling Left;Left Sidelying to Sit;Sitting - Scoot to Edge of Bed Rolling Left: 6: Modified independent (Device/Increase time);With rail Left Sidelying to Sit: 6: Modified independent (Device/Increase time);HOB elevated;With rails Sitting - Scoot to Edge of Bed: 7: Independent Transfers Transfers: Sit to Stand;Stand to Sit Sit to Stand: From bed;6: Modified  independent (Device/Increase time) Stand to Sit: 6: Modified independent (Device/Increase time);To chair/3-in-1   Exercises    Balance  End of Session OT - End of Session Equipment Utilized During Treatment: Back brace Activity Tolerance: Patient tolerated treatment well Patient left: in chair;with call bell/phone within reach Nurse Communication: Mobility status  GO     Evern Bio 01/03/2012, 9:54 AM [phone]

## 2012-01-04 MED FILL — Sodium Chloride IV Soln 0.9%: INTRAVENOUS | Qty: 1000 | Status: AC

## 2012-01-04 MED FILL — Heparin Sodium (Porcine) Inj 1000 Unit/ML: INTRAMUSCULAR | Qty: 30 | Status: AC

## 2012-01-04 MED FILL — Sodium Chloride Irrigation Soln 0.9%: Qty: 3000 | Status: AC

## 2012-01-06 ENCOUNTER — Encounter (HOSPITAL_COMMUNITY): Payer: Self-pay

## 2012-01-21 ENCOUNTER — Ambulatory Visit (INDEPENDENT_AMBULATORY_CARE_PROVIDER_SITE_OTHER): Payer: Medicaid Other | Admitting: Family Medicine

## 2012-01-21 ENCOUNTER — Encounter: Payer: Self-pay | Admitting: Family Medicine

## 2012-01-21 VITALS — BP 145/87 | Ht 73.0 in | Wt 188.0 lb

## 2012-01-21 DIAGNOSIS — I10 Essential (primary) hypertension: Secondary | ICD-10-CM

## 2012-01-21 DIAGNOSIS — M48062 Spinal stenosis, lumbar region with neurogenic claudication: Secondary | ICD-10-CM

## 2012-01-21 MED ORDER — OXYCODONE HCL 30 MG PO TABS: ORAL_TABLET | ORAL | Status: DC

## 2012-01-21 MED ORDER — OXYCODONE HCL 30 MG PO TABS: 30.0000 mg | ORAL_TABLET | Freq: Four times a day (QID) | ORAL | Status: DC | PRN

## 2012-01-21 NOTE — Assessment & Plan Note (Signed)
Lumbar decompression via laminectomy of L3-L4 and L5 removal of pseudoarthrosis L4-L5 decompression of L4-L5 and S1 nerve roots and L3 nerve roots, posterior lumbar interbody arthrodesis using peek spacers autograft and allograft at L3-4 L4-5 and L5-S1, pedicle screw fixation L3-S1, posterior lateral arthrodesis with autograft and allograft L3-S1  Doing well with decreased but still some pain. Due to the large amount of pseudoarthrosis there had to be a lot of dissection. Dr. Danielle Dess thinks and I agree that he will continue to improve over the next 3-12 months. We will continue to manage his pain medicine. Notably he has lost some weight but his appetite is coming back. I'll see him back in 3 months.

## 2012-01-21 NOTE — Progress Notes (Signed)
  Subjective:    Patient ID: [name] [name], male    DOB: [birth year], 56 y.o.   MRN: [id number]  HPI Returns for followup status post spinal surgery. Doing well. Improved but not resolved pain. He is using a walker. Has had some weight loss. Continues to have numbness and pain radiating into the right hip and now also somewhat into the anterior thigh. He is excited about the surgery credits done.  Has not yet taken his blood pressure pill this morning. He has been taking it regularly and feels like he's doing well on it without problems.  Review of Systems Mild weight loss. Denies fever, sweats, chills.    Objective:   Physical Exam  Vital signs reviewed. GENERAL: Well developed, well nourished, no acute distress BACK: Surgical incision is healing well. There is no sign of infection. There is no fluctuance or tenderness around the incision. Stands with a little bit of hip flexion secondary to some back pain. He is able to walk using a walker and can do a few steps without. Lower extremity strength 5 out of 5. NEURO: Sensation distally to soft touch is normal and bilaterally symmetrical.      Assessment & Plan:

## 2012-01-21 NOTE — Assessment & Plan Note (Signed)
No medication change today. I will recheck see him back in 3 months.

## 2012-02-04 ENCOUNTER — Ambulatory Visit: Payer: Medicaid Other | Admitting: Family Medicine

## 2012-03-17 ENCOUNTER — Encounter: Payer: Self-pay | Admitting: Family Medicine

## 2012-03-17 ENCOUNTER — Ambulatory Visit (INDEPENDENT_AMBULATORY_CARE_PROVIDER_SITE_OTHER): Payer: Medicaid Other | Admitting: Family Medicine

## 2012-03-17 VITALS — BP 170/101 | HR 94 | Ht 73.0 in | Wt 188.0 lb

## 2012-03-17 DIAGNOSIS — M48062 Spinal stenosis, lumbar region with neurogenic claudication: Secondary | ICD-10-CM

## 2012-03-17 DIAGNOSIS — I10 Essential (primary) hypertension: Secondary | ICD-10-CM

## 2012-03-17 MED ORDER — GABAPENTIN 300 MG PO CAPS
ORAL_CAPSULE | ORAL | Status: DC
Start: 2012-03-17 — End: 2012-08-02

## 2012-03-17 MED ORDER — AMLODIPINE BESYLATE 10 MG PO TABS
10.0000 mg | ORAL_TABLET | Freq: Every day | ORAL | Status: DC
Start: 2012-03-17 — End: 2013-05-30

## 2012-03-17 MED ORDER — OXYCODONE HCL 30 MG PO TABS: ORAL_TABLET | ORAL | Status: DC

## 2012-03-17 NOTE — Progress Notes (Signed)
  Subjective:    Patient ID: [name] [name], male    DOB: December 28, 1955, 56 y.o.   MRN: [id number]  HPI  . Followup low back surgery. He's doing better and better. Still need some pain medicine. Particularly in the morning when he first awakens he has some pain with walking. Overall he is about 50% better. His energy levels better. He is wearing a back brace which also seems to help quite a bit.  #2. Followup hypertension. He ran out of his medicine. Denies chest pain shortness of breath, lower extremity edema.  Review of Systems See history of present illness    Objective:   Physical Exam  Vital signs reviewed. GENERAL: Well developed, well nourished, no acute distress BACK: Nontender to percussion. Lower extremity strength 4-5 out of 5 on the right and 5 out of 5 on the left at the hip flexors. Gait is much more normal although still a little bit of a antalgic gait on the right. He can rise from the chair unassisted. CARDIOVASCULAR: Regular rate and rhythm no murmur      Assessment & Plan:

## 2012-03-17 NOTE — Assessment & Plan Note (Signed)
He is significantly improving. We discussed pain medication. I think he is a little afraid of going off his pain medicines because previously his pain was so intense. I will start him on gabapentin and taper that up to 900 mg daily. I have discussed with him tapering off of his opiates and given him prescriptions for this month and next. When I see him back in one month, we'll see how is doing on the gabapentin and the increased dose of his amlodipine. If he still having pain I think we need to taper the gabapentin on.

## 2012-03-17 NOTE — Assessment & Plan Note (Signed)
I will increase his amlodipine to 10 mg. Followup one month.

## 2012-03-17 NOTE — Patient Instructions (Addendum)
I am tapering down your pain medications. I have given you a Rx for this month and next with the goal of tapering down to NO pills a day.   I am starting a NEW pain medicine---gabapentin. Start ine at night and taper up to three at night. I am INCREASING your BP coverage by increasing the amlodipine to 10 mg a day. I need to see you back for a BP follow up in 3 months.

## 2012-03-22 ENCOUNTER — Telehealth: Payer: Self-pay | Admitting: *Deleted

## 2012-03-22 ENCOUNTER — Encounter: Payer: Self-pay | Admitting: Family Medicine

## 2012-03-22 NOTE — Telephone Encounter (Signed)
Message copied by Mora Bellman on Wed Mar 22, 2012  3:16 PM ------      Message from: CERESI, Shawna Orleans L      Created: Wed Mar 22, 2012  9:25 AM      Regarding: phone message      Contact: [phone]       Shawna Orleans from [place] wants to confirm patients  rx.

## 2012-03-22 NOTE — Progress Notes (Signed)
Patient ID: Denim Kalmbach, male   DOB: 07-04-1955, 56 y.o.   MRN: [id number] Patient called reporting he had "lost" his rxs I gave him on the 8th. I ran a DMHDDSAS report on him which showed some odd info. I have scanned the report. I also had my nurse call the drug stores he has typically used and, in fact he has already FILLEd at least one of the oxycodone rx as well as the norvasc and gabapentin rx. I am having my nurse contact him to inform him of this. I was in process of tapering him OFF the opiods as he has had surgery recently and his pain should be much less. I had a few nagging concerns about his use---he had one episode before where he LOSt his rx and I gave him tehe benefit of the doubt. I will no longer rx any narcotics for him. He wishes to speak with me and will see me at [facility] this Friday at 11:00 him.

## 2012-03-22 NOTE — Telephone Encounter (Signed)
Pt called stating he lost his prescriptions and instructions given by Dr Jennette Kettle 03/17/12.  [facility] and verified that pt did get 3 prescriptions filled that were written on 03/17/12 by Dr. Jennette Kettle including one of the oxycodone rxs.  She wrote him 2 rx's for oxycodone on 03/17/12, the 2nd one was for a taper to last him until January not to be filled until 04/16/12.  She advised pt she will not be writing rx's for any more narcotics for patient at the visit 03/17/12.  [facility] called today stating pt was trying to fill a prescription written by Dr. Jennette Kettle 01/21/12 for oxycodone- that was not to be filled until 60 days after rx was written.  Dr. Jennette Kettle advised for this not to fill this rx, as he just got oxycodone filled at [facility].  Pt coming in to discuss on Friday

## 2012-03-24 ENCOUNTER — Ambulatory Visit: Payer: Medicaid Other | Admitting: Family Medicine

## 2012-07-20 ENCOUNTER — Emergency Department (HOSPITAL_COMMUNITY)
Admission: EM | Admit: 2012-07-20 | Discharge: 2012-07-20 | Discharge status: Against Medical Advice | Payer: Medicaid Other | Attending: Emergency Medicine | Admitting: Emergency Medicine

## 2012-08-02 ENCOUNTER — Emergency Department (HOSPITAL_COMMUNITY)
Admission: EM | Admit: 2012-08-02 | Discharge: 2012-08-02 | Disposition: A | Discharge status: Home / Self Care | Payer: Medicaid Other | Attending: Emergency Medicine | Admitting: Emergency Medicine

## 2012-08-02 ENCOUNTER — Encounter (HOSPITAL_COMMUNITY): Payer: Self-pay | Admitting: Emergency Medicine

## 2012-08-02 ENCOUNTER — Emergency Department (HOSPITAL_COMMUNITY): Payer: Medicaid Other

## 2012-08-02 DIAGNOSIS — Y9389 Activity, other specified: Secondary | ICD-10-CM | POA: Insufficient documentation

## 2012-08-02 DIAGNOSIS — S93601A Unspecified sprain of right foot, initial encounter: Secondary | ICD-10-CM

## 2012-08-02 DIAGNOSIS — I10 Essential (primary) hypertension: Secondary | ICD-10-CM | POA: Insufficient documentation

## 2012-08-02 DIAGNOSIS — X500XXA Overexertion from strenuous movement or load, initial encounter: Secondary | ICD-10-CM | POA: Insufficient documentation

## 2012-08-02 DIAGNOSIS — F17200 Nicotine dependence, unspecified, uncomplicated: Secondary | ICD-10-CM | POA: Insufficient documentation

## 2012-08-02 DIAGNOSIS — M1610 Unilateral primary osteoarthritis, unspecified hip: Secondary | ICD-10-CM | POA: Insufficient documentation

## 2012-08-02 DIAGNOSIS — Y929 Unspecified place or not applicable: Secondary | ICD-10-CM | POA: Insufficient documentation

## 2012-08-02 DIAGNOSIS — S93609A Unspecified sprain of unspecified foot, initial encounter: Secondary | ICD-10-CM | POA: Insufficient documentation

## 2012-08-02 DIAGNOSIS — Z79899 Other long term (current) drug therapy: Secondary | ICD-10-CM | POA: Insufficient documentation

## 2012-08-02 MED ORDER — IBUPROFEN 600 MG PO TABS: 600.0000 mg | ORAL_TABLET | Freq: Four times a day (QID) | ORAL | Status: DC | PRN

## 2012-08-02 MED ORDER — HYDROCODONE-ACETAMINOPHEN 5-325 MG PO TABS
1.0000 | ORAL_TABLET | Freq: Once | ORAL | Status: AC
  Administered 2012-08-02: 1 via ORAL
  Filled 2012-08-02: qty 1

## 2012-08-02 NOTE — ED Notes (Signed)
Onset one day ago mopping floor and developed right foot foot and heard a pop. Denies trauma. Pedal pulse +2 full sensation.

## 2012-08-02 NOTE — ED Provider Notes (Signed)
History     CSN: [id number]  Arrival date & time 08/02/12  7829   First MD Initiated Contact with Patient 08/02/12 [phone]      Chief Complaint  Patient presents with  . Foot Pain    (Consider location/radiation/quality/duration/timing/severity/associated sxs/prior treatment) HPI  Patient presents to the emergency department for evaluation of his right foot pain. He says that he was mopping the floor last night and when he stopped to rest he felt his right foot started to feel bleeding tight and he heard a popping noise and had pain. He denies any specific trauma that caused the pain or injury. He does not will walk on it because of the pain. He says that he is disabled because of his back injuries and has history of back surgery, arthritis and hypertension. He says he normally takes hydrocodone at home but has been out. He has an appointment coming up on April 17. He denies any neck or head injuries. Denies any fall. NO fevers. nad vss  Past Medical History  Diagnosis Date  . Hypertension     Takes medications daily  . Arthritis     right hip    Past Surgical History  Procedure Laterality Date  . Back surgery  1980s    x3    No family history on file.  History  Substance Use Topics  . Smoking status: Current Every Day Smoker -- 0.30 packs/day for 35 years    Types: Cigarettes  . Smokeless tobacco: Never Used  . Alcohol Use: Yes     Comment: occasionally      Review of Systems  All other systems reviewed and are negative.    Allergies  Hctz  Home Medications   Current Outpatient Rx  Name  Route  Sig  Dispense  Refill  . amLODipine (NORVASC) 10 MG tablet   Oral   Take 1 tablet (10 mg total) by mouth daily.   90 tablet   3   . ibuprofen (ADVIL,MOTRIN) 200 MG tablet   Oral   Take 400-600 mg by mouth every 6 (six) hours as needed for pain.         Marland Kitchen ibuprofen (ADVIL,MOTRIN) 600 MG tablet   Oral   Take 1 tablet (600 mg total) by mouth every 6 (six)  hours as needed for pain.   30 tablet   0     BP 138/72  Pulse 72  Temp(Src) 98 F (36.7 C) (Oral)  Resp 16  SpO2 99%  Physical Exam  Nursing note and vitals reviewed. Constitutional: He appears well-developed and well-nourished. No distress.  HENT:  Head: Normocephalic and atraumatic.  Eyes: Pupils are equal, round, and reactive to light.  Neck: Normal range of motion. Neck supple.  Cardiovascular: Normal rate and regular rhythm.   Pulmonary/Chest: Effort normal.  Abdominal: Soft.  Musculoskeletal:       Right foot: He exhibits tenderness and swelling. He exhibits normal range of motion, no bony tenderness, normal capillary refill, no crepitus, no deformity and no laceration.       Feet:  No induration or skin abnormalities  Neurological: He is alert.  Skin: Skin is warm and dry.    ED Course  Procedures (including critical care time)  Labs Reviewed - No data to display No results found.   1. Foot sprain, right, initial encounter       MDM  Pt has been advised of the symptoms that warrant their return to the ED. Patient has voiced  understanding and has agreed to follow-up with the PCP or specialist.         Dorthula Matas, PA-C 08/04/12 2212

## 2012-08-02 NOTE — Progress Notes (Signed)
Orthopedic Tech Progress Note Patient Details:  [name] [name] March 26, 1956 [id number]  Ortho Devices Type of Ortho Device: Ace wrap;Crutches Ortho Device/Splint Interventions: Application   Cammer, Mickie Bail 08/02/2012, 10:55 AM

## 2012-08-05 NOTE — ED Provider Notes (Signed)
Medical screening examination/treatment/procedure(s) were performed by non-physician practitioner and as supervising physician I was immediately available for consultation/collaboration.   Carleene Cooper III, MD 08/05/12 1131

## 2013-05-30 ENCOUNTER — Encounter (HOSPITAL_COMMUNITY): Payer: Self-pay | Admitting: Emergency Medicine

## 2013-05-30 ENCOUNTER — Emergency Department (HOSPITAL_COMMUNITY)
Admission: EM | Admit: 2013-05-30 | Discharge: 2013-05-30 | Disposition: A | Discharge status: Home / Self Care | Payer: Medicaid Other | Attending: Emergency Medicine | Admitting: Emergency Medicine

## 2013-05-30 DIAGNOSIS — I10 Essential (primary) hypertension: Secondary | ICD-10-CM | POA: Insufficient documentation

## 2013-05-30 DIAGNOSIS — Z9889 Other specified postprocedural states: Secondary | ICD-10-CM | POA: Insufficient documentation

## 2013-05-30 DIAGNOSIS — F17200 Nicotine dependence, unspecified, uncomplicated: Secondary | ICD-10-CM | POA: Insufficient documentation

## 2013-05-30 DIAGNOSIS — Z79899 Other long term (current) drug therapy: Secondary | ICD-10-CM | POA: Insufficient documentation

## 2013-05-30 DIAGNOSIS — M1610 Unilateral primary osteoarthritis, unspecified hip: Secondary | ICD-10-CM | POA: Insufficient documentation

## 2013-05-30 DIAGNOSIS — M545 Low back pain, unspecified: Secondary | ICD-10-CM | POA: Insufficient documentation

## 2013-05-30 MED ORDER — OXYCODONE-ACETAMINOPHEN 5-325 MG PO TABS
1.0000 | ORAL_TABLET | Freq: Four times a day (QID) | ORAL | Status: DC | PRN
Start: 2013-05-30 — End: 2013-09-07

## 2013-05-30 NOTE — ED Provider Notes (Signed)
CSN: [id number]     Arrival date & time 05/30/13  1010 History  This chart was scribed for non-physician practitioner Santiago Glad, PA-C working with Celene Kras, MD by Dorothey Baseman, ED Scribe. This patient was seen in room TR10C/TR10C and the patient's care was started at 11:09 AM.    Chief Complaint  Patient presents with  . Back Pain   The history is provided by the patient. No language interpreter was used.   HPI Comments: [name] [name] is a 58 y.o. Male with a history of 3 back surgeries, last surgery was about a year ago, who presents to the Emergency Department complaining of a constant pain, 8-9/10 at its worst, to the lower back that has been progressively worsening over the past week and is exacerbated with walking. He states that the pain radiates down the right leg. He reports that he was prescribed oxycodone for pain management, but the dosage was recently changed from 30 mg to 10 mg, which is around the time that his pain began worsening. He states that he recently ran out of his prescription for oxycodone and denies taking any other OTC medications to manage his symptoms. He denies any recent potential injury or trauma to the area. He denies any bowel or bladder incontinence, fever, chills, or saddle anaesthesia. Patient also has a history of HTN and arthritis.   Past Medical History  Diagnosis Date  . Hypertension     Takes medications daily  . Arthritis     right hip   Past Surgical History  Procedure Laterality Date  . Back surgery  1980s    x3   History reviewed. No pertinent family history. History  Substance Use Topics  . Smoking status: Current Every Day Smoker -- 0.30 packs/day for 35 years    Types: Cigarettes  . Smokeless tobacco: Never Used  . Alcohol Use: Yes     Comment: occasionally    Review of Systems  A complete 10 system review of systems was obtained and all systems are negative except as noted in the HPI and PMH.   Allergies  Hctz  Home  Medications   Current Outpatient Rx  Name  Route  Sig  Dispense  Refill  . amLODipine (NORVASC) 10 MG tablet   Oral   Take 1 tablet (10 mg total) by mouth daily.   90 tablet   3   . ibuprofen (ADVIL,MOTRIN) 200 MG tablet   Oral   Take 400-600 mg by mouth every 6 (six) hours as needed for pain.         Marland Kitchen ibuprofen (ADVIL,MOTRIN) 600 MG tablet   Oral   Take 1 tablet (600 mg total) by mouth every 6 (six) hours as needed for pain.   30 tablet   0    Triage Vitals: BP 147/92  Pulse 105  Temp(Src) 98.3 F (36.8 C) (Oral)  Resp 16  Wt 211 lb (95.709 kg)  SpO2 98%  Physical Exam  Nursing note and vitals reviewed. Constitutional: He appears well-developed and well-nourished.  HENT:  Head: Normocephalic and atraumatic.  Mouth/Throat: Oropharynx is clear and moist.  Eyes: EOM are normal. Pupils are equal, round, and reactive to light.  Neck: Normal range of motion. Neck supple.  Cardiovascular: Normal rate, regular rhythm and normal heart sounds.   Pulmonary/Chest: Effort normal and breath sounds normal. He has no wheezes.  Musculoskeletal: Normal range of motion.  Patient is wearing an ASPEN brace. Tenderness to palpation of the lumbar spine.  Neurological: He is alert. He has normal reflexes.  Reflex Scores:      Patellar reflexes are 2+ on the right side and 2+ on the left side. Distal sensation intact. Strength 5/5 of bilateral lower extremities.   Skin: Skin is warm and dry. No erythema.  Well-healed surgical scar to the lower back. No erythema, edema, or warmth of the area.  Psychiatric: He has a normal mood and affect. His behavior is normal.    ED Course  Procedures (including critical care time)  DIAGNOSTIC STUDIES: Oxygen Saturation is 98% on room air, normal by my interpretation.    COORDINATION OF CARE: 11:15 AM- Discussed that imaging will not be necessary at this time. Advised patient to follow up with the orthopedist. Will discharge patient with  medication to manage symptoms. Discussed treatment plan with patient at bedside and patient verbalized agreement.     Labs Review Labs Reviewed - No data to display Imaging Review No results found.  EKG Interpretation   None       MDM  No diagnosis found. Patient with chronic back pain.  No neurological deficits and normal neuro exam.  Patient can walk but states is painful.  No loss of bowel or bladder control.  No concern for cauda equina.  No fever, night sweats, weight loss, h/o cancer, IVDU.  RICE protocol and pain medicine indicated and discussed with patient.  Patient stable for discharge.  Return precautions given.  I personally performed the services described in this documentation, which was scribed in my presence. The recorded information has been reviewed and is accurate.     Santiago GladHeather Idonna Heeren, PA-C 05/30/13 1211

## 2013-05-30 NOTE — Discharge Instructions (Signed)
Followup with your surgeon if symptoms continue. Use conservative methods at home including heat therapy and cold therapy as we discussed. More information on cold therapy is listed below.  It is not recommended to use heat treatment directly after an acute injury.  SEEK IMMEDIATE MEDICAL ATTENTION IF: New numbness, tingling, weakness, or problem with the use of your arms or legs.  Severe back pain not relieved with medications.  Change in bowel or bladder control.  Increasing pain in any areas of the body (such as chest or abdominal pain).  Shortness of breath, dizziness or fainting.  Nausea (feeling sick to your stomach), vomiting, fever, or sweats.  COLD THERAPY DIRECTIONS:  Ice or gel packs can be used to reduce both pain and swelling. Ice is the most helpful within the first 24 to 48 hours after an injury or flareup from overusing a muscle or joint.  Ice is effective, has very few side effects, and is safe for most people to use.   If you expose your skin to cold temperatures for too long or without the proper protection, you can damage your skin or nerves. Watch for signs of skin damage due to cold.   HOME CARE INSTRUCTIONS  Follow these tips to use ice and cold packs safely.  Place a dry or damp towel between the ice and skin. A damp towel will cool the skin more quickly, so you may need to shorten the time that the ice is used.  For a more rapid response, add gentle compression to the ice.  Ice for no more than 10 to 20 minutes at a time. The bonier the area you are icing, the less time it will take to get the benefits of ice.  Check your skin after 5 minutes to make sure there are no signs of a poor response to cold or skin damage.  Rest 20 minutes or more in between uses.  Once your skin is numb, you can end your treatment. You can test numbness by very lightly touching your skin. The touch should be so light that you do not see the skin dimple from the pressure of your fingertip.  When using ice, most people will feel these normal sensations in this order: cold, burning, aching, and numbness.  Do not use ice on someone who cannot communicate their responses to pain, such as small children or people with dementia.   HOW TO MAKE AN ICE PACK  To make an ice pack, do one of the following:  Place crushed ice or a bag of frozen vegetables in a sealable plastic bag. Squeeze out the excess air. Place this bag inside another plastic bag. Slide the bag into a pillowcase or place a damp towel between your skin and the bag.  Mix 3 parts water with 1 part rubbing alcohol. Freeze the mixture in a sealable plastic bag. When you remove the mixture from the freezer, it will be slushy. Squeeze out the excess air. Place this bag inside another plastic bag. Slide the bag into a pillowcase or place a damp towel between your skin and the bag.   SEEK MEDICAL CARE IF:  You develop white spots on your skin. This may give the skin a blotchy (mottled) appearance.  Your skin turns blue or pale.  Your skin becomes waxy or hard.  Your swelling gets worse.  MAKE SURE YOU:  Understand these instructions.  Will watch your condition.  Will get help right away if you are not doing well  or get worse.    Chronic Pain Discharge Instructions  Emergency care providers appreciate that many patients coming to [place] are in severe pain and we wish to address their pain in the safest, most responsible manner.  It is important to recognize however, that the proper treatment of chronic pain differs from that of the pain of injuries and acute illnesses.  Our goal is to provide quality, safe, personalized care and we thank you for giving [place] the opportunity to serve you. The use of narcotics and related agents for chronic pain syndromes may lead to additional physical and psychological problems.  Nearly as many people die from prescription narcotics each year as die from car crashes.  Additionally, this risk is increased if  such prescriptions are obtained from a variety of sources.  Therefore, only your primary care physician or a pain management specialist is able to safely treat such syndromes with narcotic medications long-term.    Documentation revealing such prescriptions have been sought from multiple sources may prohibit [place] from providing a refill or different narcotic medication.  Your name may be checked first through the [facility] Controlled Substances Reporting System.  This database is a record of controlled substance medication prescriptions that the patient has received.  This has been established by [facility] in an effort to eliminate the dangerous, and often life threatening, practice of obtaining multiple prescriptions from different medical providers.   If you have a chronic pain syndrome (i.e. chronic headaches, recurrent back or neck pain, dental pain, abdominal or pelvis pain without a specific diagnosis, or neuropathic pain such as fibromyalgia) or recurrent visits for the same condition without an acute diagnosis, you may be treated with non-narcotics and other non-addictive medicines.  Allergic reactions or negative side effects that may be reported by a patient to such medications will not typically lead to the use of a narcotic analgesic or other controlled substance as an alternative.   Patients managing chronic pain with a personal physician should have provisions in place for breakthrough pain.  If you are in crisis, you should call your physician.  If your physician directs you to the emergency department, please have the doctor call and speak to our attending physician concerning your care.   When patients come to the Emergency Department (ED) with acute medical conditions in which the Emergency Department physician feels appropriate to prescribe narcotic or sedating pain medication, the physician will prescribe these in very limited quantities.  The amount of these medications will last  only until you can see your primary care physician in his/her office.  Any patient who returns to the ED seeking refills should expect only non-narcotic pain medications.   In the event of an acute medical condition exists and the emergency physician feels it is necessary that the patient be given a narcotic or sedating medication -  a responsible adult driver should be present in the room prior to the medication being given by the nurse.   Prescriptions for narcotic or sedating medications that have been lost, stolen or expired will not be refilled in the Emergency Department.    Patients who have chronic pain may receive non-narcotic prescriptions until seen by their primary care physician.  It is every patients personal responsibility to maintain active prescriptions with his or her primary care physician or specialist.

## 2013-05-30 NOTE — ED Provider Notes (Signed)
Medical screening examination/treatment/procedure(s) were performed by non-physician practitioner and as supervising physician I was immediately available for consultation/collaboration.    Celene KrasJon R Mardy Lucier, MD 05/30/13 [phone]

## 2013-05-30 NOTE — ED Notes (Signed)
Pt had back surgery 1 year ago, and takng oxycodone and dosage has changed, pt sts now pian in back to walk

## 2013-06-07 ENCOUNTER — Encounter (HOSPITAL_COMMUNITY): Payer: Self-pay | Admitting: Emergency Medicine

## 2013-06-07 ENCOUNTER — Emergency Department (HOSPITAL_COMMUNITY)
Admission: EM | Admit: 2013-06-07 | Discharge: 2013-06-07 | Disposition: A | Discharge status: Home / Self Care | Payer: Medicaid Other | Attending: Emergency Medicine | Admitting: Emergency Medicine

## 2013-06-07 DIAGNOSIS — Z9889 Other specified postprocedural states: Secondary | ICD-10-CM | POA: Insufficient documentation

## 2013-06-07 DIAGNOSIS — M545 Low back pain, unspecified: Secondary | ICD-10-CM | POA: Insufficient documentation

## 2013-06-07 DIAGNOSIS — F17200 Nicotine dependence, unspecified, uncomplicated: Secondary | ICD-10-CM | POA: Insufficient documentation

## 2013-06-07 DIAGNOSIS — G8929 Other chronic pain: Secondary | ICD-10-CM

## 2013-06-07 DIAGNOSIS — Z79899 Other long term (current) drug therapy: Secondary | ICD-10-CM | POA: Insufficient documentation

## 2013-06-07 DIAGNOSIS — I10 Essential (primary) hypertension: Secondary | ICD-10-CM | POA: Insufficient documentation

## 2013-06-07 DIAGNOSIS — M1610 Unilateral primary osteoarthritis, unspecified hip: Secondary | ICD-10-CM | POA: Insufficient documentation

## 2013-06-07 MED ORDER — OXYCODONE-ACETAMINOPHEN 5-325 MG PO TABS: 1.0000 | ORAL_TABLET | Freq: Four times a day (QID) | ORAL | Status: DC | PRN

## 2013-06-07 MED ORDER — OXYCODONE-ACETAMINOPHEN 5-325 MG PO TABS
1.0000 | ORAL_TABLET | Freq: Once | ORAL | Status: AC
Start: 2013-06-07 — End: 2013-06-07
  Administered 2013-06-07: 1 via ORAL
  Filled 2013-06-07: qty 1

## 2013-06-07 NOTE — ED Notes (Signed)
Pt states that he is out of percocet for his back pain , and he is out

## 2013-06-07 NOTE — ED Provider Notes (Signed)
Medical screening examination/treatment/procedure(s) were performed by non-physician practitioner and as supervising physician I was immediately available for consultation/collaboration.  EKG Interpretation   None         Shawon Denzer B. Chantae Soo, MD 06/07/13 1326 

## 2013-06-07 NOTE — Discharge Instructions (Signed)
YOU HAVE BEEN GIVEN A VERY LIMITED SUPPLY OF PAIN MEDICATION. YOU MUST HAVE ALL CHRONIC BACK PAIN MEDICATION FURTHER FILLED BY YOUR PCP, NEUROSURGEON, OR AT A PAIN CLINIC.    Please follow up with your primary care physician in 1-2 days. If you do not have one please call the [facility] number listed above. Please follow up with the Neurosurgeon to schedule a follow up appointment. Please follow up with the Pain Management Clinic to schedule a follow up appointment. Please take pain medication as prescribed and as needed for pain. Please do not drive on narcotic pain medication.    Chronic Pain Discharge Instructions  Emergency care providers appreciate that many patients coming to [place] are in severe pain and we wish to address their pain in the safest, most responsible manner.  It is important to recognize however, that the proper treatment of chronic pain differs from that of the pain of injuries and acute illnesses.  Our goal is to provide quality, safe, personalized care and we thank you for giving [place] the opportunity to serve you. The use of narcotics and related agents for chronic pain syndromes may lead to additional physical and psychological problems.  Nearly as many people die from prescription narcotics each year as die from car crashes.  Additionally, this risk is increased if such prescriptions are obtained from a variety of sources.  Therefore, only your primary care physician or a pain management specialist is able to safely treat such syndromes with narcotic medications long-term.    Documentation revealing such prescriptions have been sought from multiple sources may prohibit [place] from providing a refill or different narcotic medication.  Your name may be checked first through the [facility] Controlled Substances Reporting System.  This database is a record of controlled substance medication prescriptions that the patient has received.  This has been established by  [facility] in an effort to eliminate the dangerous, and often life threatening, practice of obtaining multiple prescriptions from different medical providers.   If you have a chronic pain syndrome (i.e. chronic headaches, recurrent back or neck pain, dental pain, abdominal or pelvis pain without a specific diagnosis, or neuropathic pain such as fibromyalgia) or recurrent visits for the same condition without an acute diagnosis, you may be treated with non-narcotics and other non-addictive medicines.  Allergic reactions or negative side effects that may be reported by a patient to such medications will not typically lead to the use of a narcotic analgesic or other controlled substance as an alternative.   Patients managing chronic pain with a personal physician should have provisions in place for breakthrough pain.  If you are in crisis, you should call your physician.  If your physician directs you to the emergency department, please have the doctor call and speak to our attending physician concerning your care.   When patients come to the Emergency Department (ED) with acute medical conditions in which the Emergency Department physician feels appropriate to prescribe narcotic or sedating pain medication, the physician will prescribe these in very limited quantities.  The amount of these medications will last only until you can see your primary care physician in his/her office.  Any patient who returns to the ED seeking refills should expect only non-narcotic pain medications.   In the event of an acute medical condition exists and the emergency physician feels it is necessary that the patient be given a narcotic or sedating medication -  a responsible adult driver should be present  in the room prior to the medication being given by the nurse.   Prescriptions for narcotic or sedating medications that have been lost, stolen or expired will not be refilled in the Emergency Department.    Patients who  have chronic pain may receive non-narcotic prescriptions until seen by their primary care physician.  It is every patients personal responsibility to maintain active prescriptions with his or her primary care physician or specialist.

## 2013-06-07 NOTE — ED Notes (Signed)
Pt here from home wanting to get his meds refilled until he can see his primary MD

## 2013-06-07 NOTE — ED Provider Notes (Signed)
CSN: [id number]     Arrival date & time 06/07/13  [id number] History   First MD Initiated Contact with Patient 06/07/13 0925    This chart was scribed for Francee PiccoloJennifer Savion Washam PA-C, a non-physician practitioner working with No att. providers found by Lewanda RifeAlexandra Hurtado, ED Scribe. This patient was seen in room TR07C/TR07C and the patient's care was started at 9:42 AM     Chief Complaint  Patient presents with  . Medication Refill   (Consider location/radiation/quality/duration/timing/severity/associated sxs/prior Treatment) The history is provided by the patient. No language interpreter was used.   HPI Comments: [name] [name] is a 58 y.o. male who presents to the Emergency Department for a medication refill for percocet until he can get a primary MD. Reports he has constant chronic low back pain and radiates down right posterior upper leg. Describes pain as moderate in severity. Denies any alleviated or aggravating factors. Denies associated recent trauma, chills, nausea, change in gait, weakness, and emesis. Denies associated urinary or fecal incontinence, urinary retention, perineal/saddle paresthesias, fever, PMHx of cancer, and IV drug use. Reports PMHx of 3 back surgeries.    Past Medical History  Diagnosis Date  . Hypertension     Takes medications daily  . Arthritis     right hip   Past Surgical History  Procedure Laterality Date  . Back surgery  1980s    x3   History reviewed. No pertinent family history. History  Substance Use Topics  . Smoking status: Current Every Day Smoker -- 0.30 packs/day for 35 years    Types: Cigarettes  . Smokeless tobacco: Never Used  . Alcohol Use: Yes     Comment: occasionally    Review of Systems  Constitutional: Negative for fever.  Gastrointestinal: Negative for nausea and vomiting.  Musculoskeletal: Positive for back pain.  Neurological: Negative for weakness and numbness.  Psychiatric/Behavioral: Negative for confusion.    Allergies   Hctz  Home Medications   Current Outpatient Rx  Name  Route  Sig  Dispense  Refill  . amLODipine (NORVASC) 10 MG tablet   Oral   Take 10 mg by mouth daily.         Marland Kitchen. oxyCODONE-acetaminophen (PERCOCET/ROXICET) 5-325 MG per tablet   Oral   Take 1-2 tablets by mouth every 6 (six) hours as needed for severe pain.   20 tablet   0   . oxyCODONE-acetaminophen (PERCOCET/ROXICET) 5-325 MG per tablet   Oral   Take 1-2 tablets by mouth every 6 (six) hours as needed for severe pain.   8 tablet   0    BP 150/89  Pulse 73  Temp(Src) 98.2 F (36.8 C) (Oral)  Resp 18  SpO2 96% Physical Exam  Constitutional: He is oriented to person, place, and time. He appears well-developed and well-nourished. No distress.  HENT:  Head: Normocephalic and atraumatic.  Right Ear: External ear normal.  Left Ear: External ear normal.  Nose: Nose normal.  Mouth/Throat: Oropharynx is clear and moist. No oropharyngeal exudate.  Eyes: Conjunctivae and EOM are normal. Pupils are equal, round, and reactive to light.  Neck: Normal range of motion. Neck supple.  Cardiovascular: Normal rate, regular rhythm, normal heart sounds and intact distal pulses.   Pulmonary/Chest: Effort normal and breath sounds normal. No respiratory distress.  Abdominal: Soft. There is no tenderness.  Neurological: He is alert and oriented to person, place, and time. He has normal strength. No cranial nerve deficit or sensory deficit. Gait normal. GCS eye subscore is 4. GCS  verbal subscore is 5. GCS motor subscore is 6.  No pronator drift. Bilateral heel-knee-shin intact.  Skin: Skin is warm and dry. He is not diaphoretic.    ED Course  Procedures  Medications  oxyCODONE-acetaminophen (PERCOCET/ROXICET) 5-325 MG per tablet 1 tablet (1 tablet Oral Given 06/07/13 1001)    COORDINATION OF CARE:  Nursing notes reviewed. Vital signs reviewed. Initial pt interview and examination performed.   9:52 AM-Discussed treatment plan with  pt at bedside. Pt agrees with plan.   Treatment plan initiated: Medications  oxyCODONE-acetaminophen (PERCOCET/ROXICET) 5-325 MG per tablet 1 tablet (1 tablet Oral Given 06/07/13 1001)     Initial diagnostic testing ordered.     Labs Review Labs Reviewed - No data to display Imaging Review No results found.  EKG Interpretation   None       MDM   1. Chronic low back pain     Filed Vitals:   06/07/13 0927  BP: 150/89  Pulse: 73  Temp: 98.2 F (36.8 C)  Resp: 18   Afebrile, NAD, non-toxic appearing, AAOx4. Patient with back pain.  No neurological deficits and normal neuro exam.  Patient can walk but states is painful.  No loss of bowel or bladder control.  No concern for cauda equina.  No fever, night sweats, weight loss, h/o cancer, IVDU.  RICE protocol and pain medicine indicated and discussed with patient.     I personally performed the services described in this documentation, which was scribed in my presence. The recorded information has been reviewed and is accurate.      Lise Auer Ieshia Hatcher, PA-C 06/07/13 1300

## 2013-07-02 ENCOUNTER — Ambulatory Visit: Payer: Medicaid Other | Attending: Internal Medicine | Admitting: Internal Medicine

## 2013-07-02 VITALS — BP 163/81 | HR 81 | Temp 98.9°F | Resp 14 | Ht 73.0 in | Wt 205.6 lb

## 2013-07-02 DIAGNOSIS — Z79899 Other long term (current) drug therapy: Secondary | ICD-10-CM | POA: Insufficient documentation

## 2013-07-02 DIAGNOSIS — B182 Chronic viral hepatitis C: Secondary | ICD-10-CM | POA: Insufficient documentation

## 2013-07-02 DIAGNOSIS — M545 Low back pain, unspecified: Secondary | ICD-10-CM | POA: Insufficient documentation

## 2013-07-02 DIAGNOSIS — G8929 Other chronic pain: Secondary | ICD-10-CM | POA: Insufficient documentation

## 2013-07-02 DIAGNOSIS — M549 Dorsalgia, unspecified: Secondary | ICD-10-CM

## 2013-07-02 DIAGNOSIS — I10 Essential (primary) hypertension: Secondary | ICD-10-CM | POA: Insufficient documentation

## 2013-07-02 DIAGNOSIS — M48061 Spinal stenosis, lumbar region without neurogenic claudication: Secondary | ICD-10-CM | POA: Insufficient documentation

## 2013-07-02 DIAGNOSIS — F17200 Nicotine dependence, unspecified, uncomplicated: Secondary | ICD-10-CM | POA: Insufficient documentation

## 2013-07-02 LAB — CBC WITH DIFFERENTIAL/PLATELET
BASOS ABS: 0 10*3/uL (ref 0.0–0.1)
Basophils Relative: 1 % (ref 0–1)
EOS PCT: 3 % (ref 0–5)
Eosinophils Absolute: 0.1 10*3/uL (ref 0.0–0.7)
HCT: 43.8 % (ref 39.0–52.0)
Hemoglobin: 15.4 g/dL (ref 13.0–17.0)
LYMPHS ABS: 1.9 10*3/uL (ref 0.7–4.0)
LYMPHS PCT: 41 % (ref 12–46)
MCH: 33.4 pg (ref 26.0–34.0)
MCHC: 35.2 g/dL (ref 30.0–36.0)
MCV: 95 fL (ref 78.0–100.0)
Monocytes Absolute: 0.5 10*3/uL (ref 0.1–1.0)
Monocytes Relative: 10 % (ref 3–12)
Neutro Abs: 2.1 10*3/uL (ref 1.7–7.7)
Neutrophils Relative %: 45 % (ref 43–77)
Platelets: 183 10*3/uL (ref 150–400)
RBC: 4.61 MIL/uL (ref 4.22–5.81)
RDW: 12.6 % (ref 11.5–15.5)
WBC: 4.7 10*3/uL (ref 4.0–10.5)

## 2013-07-02 LAB — COMPLETE METABOLIC PANEL WITH GFR
ALT: 69 U/L — ABNORMAL HIGH (ref 0–53)
AST: 58 U/L — ABNORMAL HIGH (ref 0–37)
Albumin: 4.1 g/dL (ref 3.5–5.2)
Alkaline Phosphatase: 67 U/L (ref 39–117)
BUN: 13 mg/dL (ref 6–23)
CALCIUM: 9.1 mg/dL (ref 8.4–10.5)
CHLORIDE: 108 meq/L (ref 96–112)
CO2: 24 mEq/L (ref 19–32)
Creat: 1.2 mg/dL (ref 0.50–1.35)
GFR, Est African American: 78 mL/min
GFR, Est Non African American: 68 mL/min
GLUCOSE: 127 mg/dL — AB (ref 70–99)
Potassium: 3.9 mEq/L (ref 3.5–5.3)
Sodium: 139 mEq/L (ref 135–145)
Total Bilirubin: 0.8 mg/dL (ref 0.2–1.2)
Total Protein: 7.1 g/dL (ref 6.0–8.3)

## 2013-07-02 LAB — LIPID PANEL
Cholesterol: 113 mg/dL (ref 0–200)
HDL: 33 mg/dL — ABNORMAL LOW (ref >39)
LDL CALC: 49 mg/dL (ref 0–99)
TRIGLYCERIDES: 154 mg/dL — AB (ref <150)
Total CHOL/HDL Ratio: 3.4 Ratio
VLDL: 31 mg/dL (ref 0–40)

## 2013-07-02 LAB — TSH: TSH: 1.733 u[IU]/mL (ref 0.350–4.500)

## 2013-07-02 MED ORDER — GABAPENTIN 300 MG PO CAPS: 300.0000 mg | ORAL_CAPSULE | Freq: Three times a day (TID) | ORAL | Status: DC

## 2013-07-02 MED ORDER — CYCLOBENZAPRINE HCL 10 MG PO TABS: 10.0000 mg | ORAL_TABLET | Freq: Every day | ORAL | Status: DC

## 2013-07-02 MED ORDER — AMLODIPINE BESYLATE 10 MG PO TABS: 10.0000 mg | ORAL_TABLET | Freq: Every day | ORAL | Status: DC

## 2013-07-02 NOTE — Progress Notes (Signed)
Patient ID: [name] DuboisWalter Aguino, male   DOB: 1955-08-30, 58 y.o.   MRN: [id number]   CC:  HPI: 58 year old male history of advanced spondylosis and stenosis at L 34 L4-5 and L5-S1. He has evidence of a pseudoarthrosis. Previous MRI shows, severe spondylosis and stenosis L3-4 L4-5 L5-S1. He has been seen by Barnett AbuHenry Elsner, MD, status post Decompression L3-4 L4-5 and L5-S1 takedown of pseudoarthrosis L4-L5 posterior interbody arthrodesis L3-4 L4-5 L5-S1 with peek spacers local autograft and allograft, segmental fixation L3-S1 pedicle screws posterior lateral arthrodesis with local autograft and allograft L3-S1, 8/13. Followed by close assessment that Dr. Denny LevySara Neal, apparently was discharged from their practice in 11/13 because of overuse of narcotics and failure to wean for surgery and reporting he had "lost" his rxs . Therefore she stopped prescribing him pain medications. The patient has not used as gabapentin since then. He has never been established with the pain clinic. He is trying to get back to neurosurgery, but needs a referral. Denies any weakness in his legs but has chronic numbness and tingling in his right leg. No bowel or bladder incontinence  He also history of hypertension and has not taken his Norvasc for about 2 months.  Family history reviewed and found to be negative .Marland Kitchen. Social history smokes socially denies using alcohol    .   Allergies  Allergen Reactions  . Hctz [Hydrochlorothiazide]     Erectile dysfunction   Past Medical History  Diagnosis Date  . Hypertension     Takes medications daily  . Arthritis     right hip   Current Outpatient Prescriptions on File Prior to Visit  Medication Sig Dispense Refill  . oxyCODONE-acetaminophen (PERCOCET/ROXICET) 5-325 MG per tablet Take 1-2 tablets by mouth every 6 (six) hours as needed for severe pain.  20 tablet  0  . oxyCODONE-acetaminophen (PERCOCET/ROXICET) 5-325 MG per tablet Take 1-2 tablets by mouth every 6 (six) hours as  needed for severe pain.  8 tablet  0  . [DISCONTINUED] hydrochlorothiazide (HYDRODIURIL) 25 MG tablet Take 1 tablet (25 mg total) by mouth daily.  90 tablet  3   No current facility-administered medications on file prior to visit.   Family History  Problem Relation Age of Onset  . Hypertension Mother    History   Social History  . Marital Status: Single    Spouse Name: N/A    Number of Children: N/A  . Years of Education: N/A   Occupational History  . Not on file.   Social History Main Topics  . Smoking status: Current Every Day Smoker -- 0.30 packs/day for 35 years    Types: Cigarettes  . Smokeless tobacco: Never Used  . Alcohol Use: Yes     Comment: occasionally  . Drug Use: Yes    Special: Marijuana  . Sexual Activity: Not Currently   Other Topics Concern  . Not on file   Social History Narrative  . No narrative on file    Review of Systems  Constitutional: Negative for fever, chills, diaphoresis, activity change, appetite change and fatigue.  HENT: Negative for ear pain, nosebleeds, congestion, facial swelling, rhinorrhea, neck pain, neck stiffness and ear discharge.   Eyes: Negative for pain, discharge, redness, itching and visual disturbance.  Respiratory: Negative for cough, choking, chest tightness, shortness of breath, wheezing and stridor.   Cardiovascular: Negative for chest pain, palpitations and leg swelling.  Gastrointestinal: Negative for abdominal distention.  Genitourinary: Negative for dysuria, urgency, frequency, hematuria, flank pain, decreased urine volume,  difficulty urinating and dyspareunia.  Musculoskeletal: As in history of present illness Neurological: Negative for dizziness, tremors, seizures, syncope, facial asymmetry, speech difficulty, weakness, light-headedness, positive for numbness and headaches.  Hematological: Negative for adenopathy. Does not bruise/bleed easily.  Psychiatric/Behavioral: Negative for hallucinations, behavioral  problems, confusion, dysphoric mood, decreased concentration and agitation.    Objective:   Filed Vitals:   07/02/13 0910  BP: 163/81  Pulse: 81  Temp: 98.9 F (37.2 C)  Resp: 14    Physical Exam  Constitutional: Appears well-developed and well-nourished. No distress.  HENT: Normocephalic. External right and left ear normal. Oropharynx is clear and moist.  Eyes: Conjunctivae and EOM are normal. PERRLA, no scleral icterus.  Neck: Normal ROM. Neck supple. No JVD. No tracheal deviation. No thyromegaly.  CVS: RRR, S1/S2 +, no murmurs, no gallops, no carotid bruit.  Pulmonary: Effort and breath sounds normal, no stridor, rhonchi, wheezes, rales.  Abdominal: Soft. BS +,  no distension, tenderness, rebound or guarding.  Musculoskeletal: Normal range of motion. No edema and no tenderness.  Lymphadenopathy: No lymphadenopathy noted, cervical, inguinal. Neuro: Alert. Normal reflexes, muscle tone coordination. No cranial nerve deficit. Skin: Skin is warm and dry. No rash noted. Not diaphoretic. No erythema. No pallor.  Psychiatric: Normal mood and affect. Behavior, judgment, thought content normal.   Lab Results  Component Value Date   WBC 7.7 12/29/2011   HGB 13.6 12/31/2011   HCT 40.0 12/31/2011   MCV 99.1 12/29/2011   PLT 173 12/29/2011   Lab Results  Component Value Date   CREATININE 1.16 12/29/2011   BUN 15 12/29/2011   NA 138 12/31/2011   K 4.3 12/31/2011   CL 103 12/29/2011   CO2 24 12/29/2011    No results found for this basename: HGBA1C   Lipid Panel     Component Value Date/Time   CHOL 108 09/12/2009 2012   TRIG 106 09/12/2009 2012   HDL 35* 09/12/2009 2012   CHOLHDL 3.1 Ratio 09/12/2009 2012   VLDL 21 09/12/2009 2012   LDLCALC 52 09/12/2009 2012       Assessment and plan:   Patient Active Problem List   Diagnosis Date Noted  . Hypertension 04/30/2011  . Spinal stenosis, lumbar region, with neurogenic claudication 02/01/2011  . Hepatitis c, chronic 02/01/2011  . Chronic  pain 02/01/2011  . HIP PAIN, RIGHT 02/02/2010   Chronic low back pain Because of above diagnoses Start the patient on gabapentin and Flexeril Advised the patient to establish with the pain clinic Neurosurgical referral because of no improvement in pain after surgery Urine drug screen today Had 20 tablets 1/21 and 8 tablets 1/29 from the ED Patient should be negative for opiates today    Hypertension Refill Norvasc   Establish care Patient okay with colonoscopy referral Refusing flu vaccination Had a tetanus booster dose in 2014  Followup in 3 months      The patient was given clear instructions to go to ER or return to medical center if symptoms don't improve, worsen or new problems develop. The patient verbalized understanding. The patient was told to call to get any lab results if not heard anything in the next week.

## 2013-07-02 NOTE — Progress Notes (Signed)
Patient is here to establish care. Patient suffers from hypertension and has been out of medication x1 month. Patient's BP today is 163/81. Complains of lower back pains x8 years. Had surgery on his back before and is taking oxycodone for pain; helps for pain. Patient wears a back brace. Pain scale of 8 today. Pain worsens with movement. Also complains of back down the back of Rt leg and slight headaches.

## 2013-07-03 LAB — VITAMIN D 25 HYDROXY (VIT D DEFICIENCY, FRACTURES): Vit D, 25-Hydroxy: 11 ng/mL — ABNORMAL LOW (ref 30–89)

## 2013-07-09 ENCOUNTER — Telehealth: Payer: Self-pay | Admitting: *Deleted

## 2013-07-09 MED ORDER — VITAMIN D (ERGOCALCIFEROL) 1.25 MG (50000 UNIT) PO CAPS
50000.0000 [IU] | ORAL_CAPSULE | ORAL | Status: DC
Start: 2013-07-09 — End: 2013-09-07

## 2013-07-09 NOTE — Telephone Encounter (Signed)
Left a voicemail for patient to return our call. 

## 2013-07-09 NOTE — Telephone Encounter (Signed)
Message copied by Jayr Lupercio, [place] R on Mon Jul 09, 2013  2:32 PM ------      Message from: Susie CassetteABROL MD, Germain OsgoodNAYANA      Created: Wed Jul 04, 2013  1:07 PM       Notify patient of the labs are normal, vitamin D level is 11, please call in a prescription for vitamin D 50,000 units weekly, 10 tablets with 2 refills ------

## 2013-07-11 ENCOUNTER — Telehealth: Payer: Self-pay | Admitting: *Deleted

## 2013-07-11 NOTE — Telephone Encounter (Signed)
Returned a telephone call to patient. Notified patient of his lab results and the prescription sent to our pharmacy. Also gave patient the instructions on how to take the medication and the duration of the medication. Patient understood. Call completed as followed.      [place] Jeriah Skufca, CMA at 07/09/2013 2:32 PM     Status: Signed        Message copied by Shyane Fossum, [place] on Mon Jul 09, 2013 2:32 PM  ------  Message from: Susie CassetteABROL MD, Germain OsgoodNAYANA  Created: Wed Jul 04, 2013 1:07 PM  Notify patient of the labs are normal, vitamin D level is 11, please call in a prescription for vitamin D 50,000 units weekly, 10 tablets with 2 refills  ------

## 2013-09-06 ENCOUNTER — Other Ambulatory Visit: Payer: Self-pay | Admitting: Gastroenterology

## 2013-09-07 ENCOUNTER — Encounter (HOSPITAL_COMMUNITY): Payer: Self-pay | Admitting: Pharmacy Technician

## 2013-09-12 ENCOUNTER — Encounter (HOSPITAL_COMMUNITY): Payer: Self-pay | Admitting: *Deleted

## 2013-09-18 ENCOUNTER — Encounter (HOSPITAL_COMMUNITY): Payer: Medicaid Other | Admitting: *Deleted

## 2013-09-18 ENCOUNTER — Encounter (HOSPITAL_COMMUNITY)
Admission: RE | Disposition: A | Discharge status: Home / Self Care | Payer: Self-pay | Source: Ambulatory Visit | Attending: Gastroenterology

## 2013-09-18 ENCOUNTER — Ambulatory Visit (HOSPITAL_COMMUNITY): Payer: Medicaid Other | Admitting: *Deleted

## 2013-09-18 ENCOUNTER — Encounter (HOSPITAL_COMMUNITY): Payer: Self-pay | Admitting: *Deleted

## 2013-09-18 ENCOUNTER — Ambulatory Visit (HOSPITAL_COMMUNITY)
Admission: RE | Admit: 2013-09-18 | Discharge: 2013-09-18 | Disposition: A | Discharge status: Home / Self Care | Payer: Medicaid Other | Source: Ambulatory Visit | Attending: Gastroenterology | Admitting: Gastroenterology

## 2013-09-18 DIAGNOSIS — I10 Essential (primary) hypertension: Secondary | ICD-10-CM | POA: Insufficient documentation

## 2013-09-18 DIAGNOSIS — Z1211 Encounter for screening for malignant neoplasm of colon: Secondary | ICD-10-CM | POA: Insufficient documentation

## 2013-09-18 DIAGNOSIS — M1610 Unilateral primary osteoarthritis, unspecified hip: Secondary | ICD-10-CM | POA: Insufficient documentation

## 2013-09-18 DIAGNOSIS — F17200 Nicotine dependence, unspecified, uncomplicated: Secondary | ICD-10-CM | POA: Insufficient documentation

## 2013-09-18 DIAGNOSIS — B182 Chronic viral hepatitis C: Secondary | ICD-10-CM | POA: Insufficient documentation

## 2013-09-18 HISTORY — PX: COLONOSCOPY WITH PROPOFOL: SHX5780

## 2013-09-18 HISTORY — DX: Other intervertebral disc displacement, lumbar region: M51.26

## 2013-09-18 SURGERY — COLONOSCOPY WITH PROPOFOL
Anesthesia: Monitor Anesthesia Care

## 2013-09-18 MED ORDER — FENTANYL CITRATE 0.05 MG/ML IJ SOLN
INTRAMUSCULAR | Status: AC
  Filled 2013-09-18: qty 2

## 2013-09-18 MED ORDER — LIDOCAINE HCL (CARDIAC) 20 MG/ML IV SOLN
INTRAVENOUS | Status: AC
  Filled 2013-09-18: qty 5

## 2013-09-18 MED ORDER — PROPOFOL INFUSION 10 MG/ML OPTIME
INTRAVENOUS | Status: DC | PRN
  Administered 2013-09-18: 140 ug/kg/min via INTRAVENOUS

## 2013-09-18 MED ORDER — PROPOFOL 10 MG/ML IV BOLUS
INTRAVENOUS | Status: AC
  Filled 2013-09-18: qty 20

## 2013-09-18 MED ORDER — KETAMINE HCL 10 MG/ML IJ SOLN
INTRAMUSCULAR | Status: AC
  Filled 2013-09-18: qty 1

## 2013-09-18 MED ORDER — FENTANYL CITRATE 0.05 MG/ML IJ SOLN
INTRAMUSCULAR | Status: DC | PRN
  Administered 2013-09-18: 100 ug via INTRAVENOUS

## 2013-09-18 MED ORDER — SODIUM CHLORIDE 0.9 % IV SOLN: INTRAVENOUS | Status: DC

## 2013-09-18 MED ORDER — LACTATED RINGERS IV SOLN
INTRAVENOUS | Status: DC | PRN
  Administered 2013-09-18: 08:00:00 via INTRAVENOUS

## 2013-09-18 MED ORDER — KETAMINE HCL 10 MG/ML IJ SOLN
INTRAMUSCULAR | Status: DC | PRN
  Administered 2013-09-18 (×2): 10 mg via INTRAVENOUS

## 2013-09-18 MED ORDER — MIDAZOLAM HCL 2 MG/2ML IJ SOLN
INTRAMUSCULAR | Status: AC
  Filled 2013-09-18: qty 2

## 2013-09-18 MED ORDER — MIDAZOLAM HCL 5 MG/5ML IJ SOLN
INTRAMUSCULAR | Status: DC | PRN
  Administered 2013-09-18 (×2): 1 mg via INTRAVENOUS

## 2013-09-18 SURGICAL SUPPLY — 22 items

## 2013-09-18 NOTE — Anesthesia Postprocedure Evaluation (Signed)
  Anesthesia Post-op Note  Patient: [name] [name]  Procedure(s) Performed: Procedure(s) (LRB): COLONOSCOPY WITH PROPOFOL (N/A)  Patient Location: PACU  Anesthesia Type: MAC  Level of Consciousness: awake and alert   Airway and Oxygen Therapy: Patient Spontanous Breathing  Post-op Pain: mild  Post-op Assessment: Post-op Vital signs reviewed, Patient's Cardiovascular Status Stable, Respiratory Function Stable, Patent Airway and No signs of Nausea or vomiting  Last Vitals:  Filed Vitals:   09/18/13 0828  BP: 90/60  Pulse: 56  Temp:   Resp: 19    Post-op Vital Signs: stable   Complications: No apparent anesthesia complications

## 2013-09-18 NOTE — Op Note (Signed)
Procedure: Baseline screening colonoscopy  Endoscopist: Danise EdgeMartin Johnson  Premedication: Propofol administered by anesthesia  Procedure: The patient was placed in the left lateral decubitus position. Anal inspection and digital rectal exam were normal. The Pentax pediatric colonoscope was introduced into the rectum and easily advanced to the cecum. A normal-appearing ileocecal valve and appendiceal orifice were identified. Colonic preparation for the exam today was good.  Rectum. Normal. Retroflexed view of the distal rectum normal  Sigmoid colon and descending colon. Normal  Splenic flexure. Normal  Transverse colon. Normal  Hepatic flexure. Normal  Ascending colon. Normal.  Cecum and ileocecal valve. Normal  Assessment: Normal screening proctocolonoscopy to the cecum  Recommendation: Schedule repeat screening colonoscopy in 10 years

## 2013-09-18 NOTE — Transfer of Care (Signed)
Immediate Anesthesia Transfer of Care Note  Patient: [name] DuboisWalter [name]  Procedure(s) Performed: Procedure(s): COLONOSCOPY WITH PROPOFOL (N/A)  Patient Location: PACU and Endoscopy Unit  Anesthesia Type:MAC  Level of Consciousness: awake, alert , oriented and patient cooperative  Airway & Oxygen Therapy: Patient Spontanous Breathing and Patient connected to face mask oxygen  Post-op Assessment: Report given to PACU RN, Post -op Vital signs reviewed and stable and Patient moving all extremities  Post vital signs: Reviewed and stable  Complications: No apparent anesthesia complications

## 2013-09-18 NOTE — Discharge Instructions (Signed)
Colonoscopy, Care After °Refer to this sheet in the next few weeks. These instructions provide you with information on caring for yourself after your procedure. Your health care provider may also give you more specific instructions. Your treatment has been planned according to current medical practices, but problems sometimes occur. Call your health care provider if you have any problems or questions after your procedure. °WHAT TO EXPECT AFTER THE PROCEDURE  °After your procedure, it is typical to have the following: °· A small amount of blood in your stool. °· Moderate amounts of gas and mild abdominal cramping or bloating. °HOME CARE INSTRUCTIONS °· Do not drive, operate machinery, or sign important documents for 24 hours. °· You may shower and resume your regular physical activities, but move at a slower pace for the first 24 hours. °· Take frequent rest periods for the first 24 hours. °· Walk around or put a warm pack on your abdomen to help reduce abdominal cramping and bloating. °· Drink enough fluids to keep your urine clear or pale yellow. °· You may resume your normal diet as instructed by your health care provider. Avoid heavy or fried foods that are hard to digest. °· Avoid drinking alcohol for 24 hours or as instructed by your health care provider. °· Only take over-the-counter or prescription medicines as directed by your health care provider. °· If a tissue sample (biopsy) was taken during your procedure: °· Do not take aspirin or blood thinners for 7 days, or as instructed by your health care provider. °· Do not drink alcohol for 7 days, or as instructed by your health care provider. °· Eat soft foods for the first 24 hours. °SEEK MEDICAL CARE IF: °You have persistent spotting of blood in your stool 2 3 days after the procedure. °SEEK IMMEDIATE MEDICAL CARE IF: °· You have more than a small spotting of blood in your stool. °· You pass large blood clots in your stool. °· Your abdomen is swollen  (distended). °· You have nausea or vomiting. °· You have a fever. °· You have increasing abdominal pain that is not relieved with medicine. °Document Released: 12/09/2003 Document Revised: 02/14/2013 Document Reviewed: 01/01/2013 °Exit[employer]® Patient Information ©2014 [employer]. ° °

## 2013-09-18 NOTE — H&P (Signed)
  Procedure: Baseline screening colonoscopy  History: The patient is a 58 year old male born 1956-02-03. He is scheduled to undergo his first screening colonoscopy with polypectomy to prevent colon cancer.  The patient has chronic viral hepatitis C (genotype 1a). [facility]2010 which showed chronic active hepatitis C, inflammatory grade 2, fibrosis stage 2 with portal fibrosis unassociated with architecture distortion. The patient has an appointment to discuss therapy at [facility].  Chronic medications: None  Medication allergies: None.  Past medical history: Chronic viral hepatitis C. Positive hepatitis B surface antibody. Negative hepatitis B surface antigen. Lumbar spinal stenosis requiring surgery. Hypertension. Right hip arthritis. Chronic cigarette smoking.  Exam: The patient is alert and lying comfortably on the endoscopy stretcher. Cardiac exam reveals a regular rhythm. Lungs are clear to auscultation. Abdomen is soft nontender to palpation.  Plan: Proceed with baseline screening colonoscopy

## 2013-09-18 NOTE — Anesthesia Preprocedure Evaluation (Signed)
Anesthesia Evaluation  Patient identified by MRN, date of birth, ID band Patient awake    Reviewed: Allergy & Precautions, H&P , NPO status , Patient's Chart, lab work & pertinent test results  Airway Mallampati: II TM Distance: >3 FB Neck ROM: Full    Dental no notable dental hx.    Pulmonary neg pulmonary ROS, Current Smoker,  breath sounds clear to auscultation  Pulmonary exam normal       Cardiovascular hypertension, Pt. on medications Rhythm:Regular Rate:Normal     Neuro/Psych negative neurological ROS  negative psych ROS   GI/Hepatic negative GI ROS, (+) Hepatitis -, C  Endo/Other  negative endocrine ROS  Renal/GU negative Renal ROS  negative genitourinary   Musculoskeletal negative musculoskeletal ROS (+)   Abdominal   Peds negative pediatric ROS (+)  Hematology negative hematology ROS (+)   Anesthesia Other Findings   Reproductive/Obstetrics negative OB ROS                           Anesthesia Physical Anesthesia Plan  ASA: II  Anesthesia Plan: MAC   Post-op Pain Management:    Induction:   Airway Management Planned: Simple Face Mask  Additional Equipment:   Intra-op Plan:   Post-operative Plan:   Informed Consent: I have reviewed the patients History and Physical, chart, labs and discussed the procedure including the risks, benefits and alternatives for the proposed anesthesia with the patient or authorized representative who has indicated his/her understanding and acceptance.   Dental advisory given  Plan Discussed with: CRNA  Anesthesia Plan Comments:         Anesthesia Quick Evaluation

## 2013-09-19 ENCOUNTER — Encounter (HOSPITAL_COMMUNITY): Payer: Self-pay | Admitting: Gastroenterology

## 2013-09-24 ENCOUNTER — Other Ambulatory Visit: Payer: Medicaid Other

## 2013-09-24 DIAGNOSIS — B182 Chronic viral hepatitis C: Secondary | ICD-10-CM

## 2013-09-25 LAB — HCV RNA QUANT RFLX ULTRA OR GENOTYP
HCV Quantitative Log: 6.43 {Log} — ABNORMAL HIGH (ref <1.18)
HCV Quantitative: 2696507 IU/mL — ABNORMAL HIGH (ref <15)

## 2013-09-26 ENCOUNTER — Telehealth: Payer: Self-pay | Admitting: Emergency Medicine

## 2013-09-26 NOTE — Telephone Encounter (Signed)
Left message for pt to call when receive message

## 2013-09-26 NOTE — Telephone Encounter (Signed)
Message copied by Darlis LoanSMITH, Brockton Mckesson D on Wed Sep 26, 2013 11:13 AM ------      Message from: Holland CommonsKECK, VALERIE A      Created: Tue Sep 25, 2013  8:19 PM       Will you call this patient and let him know his colonoscopy was normal and he does not need a repeat one for 10 years. Thanks ------

## 2013-09-27 LAB — HEPATITIS C GENOTYPE

## 2013-10-02 ENCOUNTER — Encounter: Payer: Self-pay | Admitting: Internal Medicine

## 2013-10-02 ENCOUNTER — Ambulatory Visit: Payer: Medicaid Other | Attending: Internal Medicine | Admitting: Internal Medicine

## 2013-10-02 VITALS — BP 125/88 | HR 83 | Temp 98.4°F | Resp 16 | Ht 73.0 in | Wt 208.0 lb

## 2013-10-02 DIAGNOSIS — Z79899 Other long term (current) drug therapy: Secondary | ICD-10-CM | POA: Insufficient documentation

## 2013-10-02 DIAGNOSIS — IMO0001 Reserved for inherently not codable concepts without codable children: Secondary | ICD-10-CM

## 2013-10-02 DIAGNOSIS — F528 Other sexual dysfunction not due to a substance or known physiological condition: Secondary | ICD-10-CM

## 2013-10-02 DIAGNOSIS — M1610 Unilateral primary osteoarthritis, unspecified hip: Secondary | ICD-10-CM | POA: Insufficient documentation

## 2013-10-02 DIAGNOSIS — I10 Essential (primary) hypertension: Secondary | ICD-10-CM

## 2013-10-02 DIAGNOSIS — N529 Male erectile dysfunction, unspecified: Secondary | ICD-10-CM

## 2013-10-02 DIAGNOSIS — E559 Vitamin D deficiency, unspecified: Secondary | ICD-10-CM

## 2013-10-02 DIAGNOSIS — M5126 Other intervertebral disc displacement, lumbar region: Secondary | ICD-10-CM | POA: Diagnosis not present

## 2013-10-02 DIAGNOSIS — F17200 Nicotine dependence, unspecified, uncomplicated: Secondary | ICD-10-CM | POA: Diagnosis not present

## 2013-10-02 MED ORDER — ERGOCALCIFEROL 1.25 MG (50000 UT) PO CAPS: 50000.0000 [IU] | ORAL_CAPSULE | ORAL | Status: DC

## 2013-10-02 NOTE — Patient Instructions (Signed)
DASH Diet The DASH diet stands for "Dietary Approaches to Stop Hypertension." It is a healthy eating plan that has been shown to reduce high blood pressure (hypertension) in as little as 14 days, while also possibly providing other significant health benefits. These other health benefits include reducing the risk of breast cancer after menopause and reducing the risk of type 2 diabetes, heart disease, colon cancer, and stroke. Health benefits also include weight loss and slowing kidney failure in patients with chronic kidney disease.  DIET GUIDELINES  Limit salt (sodium). Your diet should contain less than 1500 mg of sodium daily.  Limit refined or processed carbohydrates. Your diet should include mostly whole grains. Desserts and added sugars should be used sparingly.  Include small amounts of heart-healthy fats. These types of fats include nuts, oils, and tub margarine. Limit saturated and trans fats. These fats have been shown to be harmful in the body. CHOOSING FOODS  The following food groups are based on a 2000 calorie diet. See your Registered Dietitian for individual calorie needs. Grains and Grain Products (6 to 8 servings daily)  Eat More Often: Whole-wheat bread, brown rice, whole-grain or wheat pasta, quinoa, popcorn without added fat or salt (air popped).  Eat Less Often: White bread, white pasta, white rice, cornbread. Vegetables (4 to 5 servings daily)  Eat More Often: Fresh, frozen, and canned vegetables. Vegetables may be raw, steamed, roasted, or grilled with a minimal amount of fat.  Eat Less Often/Avoid: Creamed or fried vegetables. Vegetables in a cheese sauce. Fruit (4 to 5 servings daily)  Eat More Often: All fresh, canned (in natural juice), or frozen fruits. Dried fruits without added sugar. One hundred percent fruit juice ( cup [237 mL] daily).  Eat Less Often: Dried fruits with added sugar. Canned fruit in light or heavy syrup. Lean Meats, Fish, and Poultry (2  servings or less daily. One serving is 3 to 4 oz [85-114 g]).  Eat More Often: Ninety percent or leaner ground beef, tenderloin, sirloin. Round cuts of beef, chicken breast, turkey breast. All fish. Grill, bake, or broil your meat. Nothing should be fried.  Eat Less Often/Avoid: Fatty cuts of meat, turkey, or chicken leg, thigh, or wing. Fried cuts of meat or fish. Dairy (2 to 3 servings)  Eat More Often: Low-fat or fat-free milk, low-fat plain or light yogurt, reduced-fat or part-skim cheese.  Eat Less Often/Avoid: Milk (whole, 2%).Whole milk yogurt. Full-fat cheeses. Nuts, Seeds, and Legumes (4 to 5 servings per week)  Eat More Often: All without added salt.  Eat Less Often/Avoid: Salted nuts and seeds, canned beans with added salt. Fats and Sweets (limited)  Eat More Often: Vegetable oils, tub margarines without trans fats, sugar-free gelatin. Mayonnaise and salad dressings.  Eat Less Often/Avoid: Coconut oils, palm oils, butter, stick margarine, cream, half and half, cookies, candy, pie. FOR MORE INFORMATION The Dash Diet Eating Plan: www.dashdiet.org Document Released: 04/15/2011 Document Revised: 07/19/2011 Document Reviewed: 04/15/2011 ExitCare Patient Information 2014 [employer]. Smoking Cessation Quitting smoking is important to your health and has many advantages. However, it is not always easy to quit since nicotine is a very addictive drug. Often times, people try 3 times or more before being able to quit. This document explains the best ways for you to prepare to quit smoking. Quitting takes hard work and a lot of effort, but you can do it. ADVANTAGES OF QUITTING SMOKING  You will live longer, feel better, and live better.  Your body will feel the   impact of quitting smoking almost immediately.  Within 20 minutes, blood pressure decreases. Your pulse returns to its normal level.  After 8 hours, carbon monoxide levels in the blood return to normal. Your oxygen level  increases.  After 24 hours, the chance of having a heart attack starts to decrease. Your breath, hair, and body stop smelling like smoke.  After 48 hours, damaged nerve endings begin to recover. Your sense of taste and smell improve.  After 72 hours, the body is virtually free of nicotine. Your bronchial tubes relax and breathing becomes easier.  After 2 to 12 weeks, lungs can hold more air. Exercise becomes easier and circulation improves.  The risk of having a heart attack, stroke, cancer, or lung disease is greatly reduced.  After 1 year, the risk of coronary heart disease is cut in half.  After 5 years, the risk of stroke falls to the same as a nonsmoker.  After 10 years, the risk of lung cancer is cut in half and the risk of other cancers decreases significantly.  After 15 years, the risk of coronary heart disease drops, usually to the level of a nonsmoker.  If you are pregnant, quitting smoking will improve your chances of having a healthy baby.  The people you live with, especially any children, will be healthier.  You will have extra money to spend on things other than cigarettes. QUESTIONS TO THINK ABOUT BEFORE ATTEMPTING TO QUIT You may want to talk about your answers with your caregiver.  Why do you want to quit?  If you tried to quit in the past, what helped and what did not?  What will be the most difficult situations for you after you quit? How will you plan to handle them?  Who can help you through the tough times? Your family? Friends? A caregiver?  What pleasures do you get from smoking? What ways can you still get pleasure if you quit? Here are some questions to ask your caregiver:  How can you help me to be successful at quitting?  What medicine do you think would be best for me and how should I take it?  What should I do if I need more help?  What is smoking withdrawal like? How can I get information on withdrawal? GET READY  Set a quit  date.  Change your environment by getting rid of all cigarettes, ashtrays, matches, and lighters in your home, car, or work. Do not let people smoke in your home.  Review your past attempts to quit. Think about what worked and what did not. GET SUPPORT AND ENCOURAGEMENT You have a better chance of being successful if you have help. You can get support in many ways.  Tell your family, friends, and co-workers that you are going to quit and need their support. Ask them not to smoke around you.  Get individual, group, or telephone counseling and support. Programs are available at local hospitals and health centers. Call your local health department for information about programs in your area.  Spiritual beliefs and practices may help some smokers quit.  Download a "quit meter" on your computer to keep track of quit statistics, such as how long you have gone without smoking, cigarettes not smoked, and money saved.  Get a self-help book about quitting smoking and staying off of tobacco. LEARN NEW SKILLS AND BEHAVIORS  Distract yourself from urges to smoke. Talk to someone, go for a walk, or occupy your time with a task.  Change your   normal routine. Take a different route to work. Drink tea instead of coffee. Eat breakfast in a different place.  Reduce your stress. Take a hot bath, exercise, or read a book.  Plan something enjoyable to do every day. Reward yourself for not smoking.  Explore interactive web-based programs that specialize in helping you quit. GET MEDICINE AND USE IT CORRECTLY Medicines can help you stop smoking and decrease the urge to smoke. Combining medicine with the above behavioral methods and support can greatly increase your chances of successfully quitting smoking.  Nicotine replacement therapy helps deliver nicotine to your body without the negative effects and risks of smoking. Nicotine replacement therapy includes nicotine gum, lozenges, inhalers, nasal sprays, and  skin patches. Some may be available over-the-counter and others require a prescription.  Antidepressant medicine helps people abstain from smoking, but how this works is unknown. This medicine is available by prescription.  Nicotinic receptor partial agonist medicine simulates the effect of nicotine in your brain. This medicine is available by prescription. Ask your caregiver for advice about which medicines to use and how to use them based on your health history. Your caregiver will tell you what side effects to look out for if you choose to be on a medicine or therapy. Carefully read the information on the package. Do not use any other product containing nicotine while using a nicotine replacement product.  RELAPSE OR DIFFICULT SITUATIONS Most relapses occur within the first 3 months after quitting. Do not be discouraged if you start smoking again. Remember, most people try several times before finally quitting. You may have symptoms of withdrawal because your body is used to nicotine. You may crave cigarettes, be irritable, feel very hungry, cough often, get headaches, or have difficulty concentrating. The withdrawal symptoms are only temporary. They are strongest when you first quit, but they will go away within 10 14 days. To reduce the chances of relapse, try to:  Avoid drinking alcohol. Drinking lowers your chances of successfully quitting.  Reduce the amount of caffeine you consume. Once you quit smoking, the amount of caffeine in your body increases and can give you symptoms, such as a rapid heartbeat, sweating, and anxiety.  Avoid smokers because they can make you want to smoke.  Do not let weight gain distract you. Many smokers will gain weight when they quit, usually less than 10 pounds. Eat a healthy diet and stay active. You can always lose the weight gained after you quit.  Find ways to improve your mood other than smoking. FOR MORE INFORMATION  www.smokefree.gov  Document  Released: 04/20/2001 Document Revised: 10/26/2011 Document Reviewed: 08/05/2011 ExitCare Patient Information 2014 [employer].  

## 2013-10-02 NOTE — Progress Notes (Signed)
Pt is here following up on his HTN. Pt states that this BP medication is causing him to have ED.

## 2013-10-02 NOTE — Progress Notes (Signed)
Patient ID: [name] [name], male   DOB: 1955-10-09, 58 y.o.   MRN: [id number]  CC: f/u htn, difficulty maintaining erection  HPI:  Patient has c/o of problems maintaining an erection for past 6 months.  Patient states that it is every time he attempts to have sex.  He cannot describes how long his erections are lasting.  He is attempting to have sex 3-4 times per week.  Patient reports that he is a current daily smoker.  Complains of pain in his lower back, reports that he has had three surgeries in the past.  Reports that he has only been given gabapentin for pain which does not help.     Allergies  Allergen Reactions  . Hctz [Hydrochlorothiazide]     Erectile dysfunction   Past Medical History  Diagnosis Date  . Hypertension     Takes medications daily  . Arthritis     right hip  . Disc displacement, lumbar    Current Outpatient Prescriptions on File Prior to Visit  Medication Sig Dispense Refill  . amLODipine (NORVASC) 10 MG tablet Take 10 mg by mouth every morning.      . gabapentin (NEURONTIN) 300 MG capsule Take 1 capsule (300 mg total) by mouth 3 (three) times daily.  90 capsule  5  . [DISCONTINUED] hydrochlorothiazide (HYDRODIURIL) 25 MG tablet Take 1 tablet (25 mg total) by mouth daily.  90 tablet  3   No current facility-administered medications on file prior to visit.   Family History  Problem Relation Age of Onset  . Hypertension Mother    History   Social History  . Marital Status: Single    Spouse Name: N/A    Number of Children: N/A  . Years of Education: N/A   Occupational History  . Not on file.   Social History Main Topics  . Smoking status: Current Every Day Smoker -- 0.30 packs/day for 35 years    Types: Cigarettes  . Smokeless tobacco: Never Used  . Alcohol Use: Yes     Comment: occasionally  . Drug Use: Yes    Special: Marijuana  . Sexual Activity: Not Currently   Other Topics Concern  . Not on file   Social History Narrative  . No narrative  on file    Review of Systems  Eyes: Negative.   Respiratory: Negative.   Cardiovascular: Negative for chest pain, palpitations, claudication and leg swelling.  Musculoskeletal: Positive for back pain.  Neurological: Negative for dizziness, tingling and headaches.     Objective:   Filed Vitals:   10/02/13 1228  BP: 125/88  Pulse: 83  Temp: 98.4 F (36.9 C)  Resp: 16   Physical Exam  Vitals reviewed. Constitutional: He is oriented to person, place, and time. He appears well-developed.  Eyes: No scleral icterus.  Neck: Normal range of motion. Neck supple. No JVD present.  Cardiovascular: Normal rate, regular rhythm and normal heart sounds.   Pulmonary/Chest: Effort normal and breath sounds normal.  Abdominal: Soft. Bowel sounds are normal. He exhibits no distension. There is no tenderness.  Musculoskeletal: Normal range of motion.  Neurological: He is alert and oriented to person, place, and time.  Psychiatric: He has a normal mood and affect.     Lab Results  Component Value Date   WBC 4.7 07/02/2013   HGB 15.4 07/02/2013   HCT 43.8 07/02/2013   MCV 95.0 07/02/2013   PLT 183 07/02/2013   Lab Results  Component Value Date   CREATININE 1.20  07/02/2013   BUN 13 07/02/2013   NA 139 07/02/2013   K 3.9 07/02/2013   CL 108 07/02/2013   CO2 24 07/02/2013    No results found for this basename: HGBA1C   Lipid Panel     Component Value Date/Time   CHOL 113 07/02/2013 0929   TRIG 154* 07/02/2013 0929   HDL 33* 07/02/2013 0929   CHOLHDL 3.4 07/02/2013 0929   VLDL 31 07/02/2013 0929   LDLCALC 49 07/02/2013 0929       Assessment and plan:  Zollie BeckersWalter was seen today for follow-up.  Diagnoses and associated orders for this visit:  HTN (hypertension) Continue with current aregimen Inability to maintain erection Explained in detail the patho of smoking and HTN on sexual health.  Smoking Smoking cessation discussed in detail Unspecified vitamin D deficiency - ergocalciferol  (VITAMIN D2) 50000 UNITS capsule; Take 1 capsule (50,000 Units total) by mouth once a week.  Return in about 3 months (around 01/02/2014) for Hypertension.      Ambrose FinlandValerie A Keck, NP-C [facility] [phone] 10/02/2013, 12:47 PM

## 2013-10-29 ENCOUNTER — Encounter: Payer: Self-pay | Admitting: Internal Medicine

## 2013-10-29 ENCOUNTER — Ambulatory Visit (INDEPENDENT_AMBULATORY_CARE_PROVIDER_SITE_OTHER): Payer: Medicaid Other | Admitting: Internal Medicine

## 2013-10-29 VITALS — BP 135/85 | HR 69 | Temp 98.3°F | Ht 73.0 in | Wt 204.0 lb

## 2013-10-29 DIAGNOSIS — B182 Chronic viral hepatitis C: Secondary | ICD-10-CM

## 2013-10-29 DIAGNOSIS — Z23 Encounter for immunization: Secondary | ICD-10-CM | POA: Diagnosis not present

## 2013-10-29 LAB — CBC WITH DIFFERENTIAL/PLATELET
BASOS ABS: 0 10*3/uL (ref 0.0–0.1)
BASOS PCT: 0 % (ref 0–1)
EOS ABS: 0.2 10*3/uL (ref 0.0–0.7)
Eosinophils Relative: 4 % (ref 0–5)
HCT: 44 % (ref 39.0–52.0)
HEMOGLOBIN: 15.5 g/dL (ref 13.0–17.0)
Lymphocytes Relative: 40 % (ref 12–46)
Lymphs Abs: 2 10*3/uL (ref 0.7–4.0)
MCH: 33.3 pg (ref 26.0–34.0)
MCHC: 35.2 g/dL (ref 30.0–36.0)
MCV: 94.4 fL (ref 78.0–100.0)
MONOS PCT: 9 % (ref 3–12)
Monocytes Absolute: 0.5 10*3/uL (ref 0.1–1.0)
NEUTROS ABS: 2.4 10*3/uL (ref 1.7–7.7)
Neutrophils Relative %: 47 % (ref 43–77)
Platelets: 177 10*3/uL (ref 150–400)
RBC: 4.66 MIL/uL (ref 4.22–5.81)
RDW: 12.4 % (ref 11.5–15.5)
WBC: 5.1 10*3/uL (ref 4.0–10.5)

## 2013-10-29 LAB — COMPLETE METABOLIC PANEL WITH GFR
ALBUMIN: 4.4 g/dL (ref 3.5–5.2)
ALT: 53 U/L (ref 0–53)
AST: 43 U/L — ABNORMAL HIGH (ref 0–37)
Alkaline Phosphatase: 77 U/L (ref 39–117)
BILIRUBIN TOTAL: 0.7 mg/dL (ref 0.2–1.2)
BUN: 18 mg/dL (ref 6–23)
CO2: 23 meq/L (ref 19–32)
Calcium: 9.7 mg/dL (ref 8.4–10.5)
Chloride: 105 mEq/L (ref 96–112)
Creat: 1.35 mg/dL (ref 0.50–1.35)
GFR, EST AFRICAN AMERICAN: 68 mL/min
GFR, EST NON AFRICAN AMERICAN: 59 mL/min — AB
Glucose, Bld: 107 mg/dL — ABNORMAL HIGH (ref 70–99)
Potassium: 4.4 mEq/L (ref 3.5–5.3)
SODIUM: 139 meq/L (ref 135–145)
TOTAL PROTEIN: 7.7 g/dL (ref 6.0–8.3)

## 2013-10-29 LAB — PROTIME-INR
INR: 0.94 (ref <1.50)
PROTHROMBIN TIME: 12.5 s (ref 11.6–15.2)

## 2013-10-29 LAB — IRON: Iron: 75 ug/dL (ref 42–165)

## 2013-10-29 LAB — HIV ANTIBODY (ROUTINE TESTING W REFLEX): HIV 1&2 Ab, 4th Generation: NONREACTIVE

## 2013-10-29 NOTE — Progress Notes (Signed)
+  Barton DuboisWalter Baruch is a 58 y.o. male who presents for initial evaluation and management of a positive Hepatitis C antibody test.  Patient tested positive several years ago. Test was performed as part of an evaluation of routine evaluation. Hepatitis C risk factors present are: none. Patient denies accidental needle stick, history of blood transfusion, history of clotting factor transfusion, intranasal drug use, IV drug abuse, multiple sexual partners, renal dialysis, sexual contact with person with liver disease, tattoos. Patient has had other studies performed. Results: hepatitis C RNA by PCR, result: positive. Patient has not had prior treatment for Hepatitis C. Patient does not have a past history of liver disease. Patient does not have a family history of liver disease.   HPI: [facility] and had a biopsy that was F2.  He was not offfered treatment at that time.  He is interested in treatment now.   Patient does not have documented immunity to Hepatitis A. Patient does have documented immunity to Hepatitis B.     Review of Systems A comprehensive review of systems was negative.   Past Medical History  Diagnosis Date  . Hypertension     Takes medications daily  . Arthritis     right hip  . Disc displacement, lumbar     History  Substance Use Topics  . Smoking status: Current Every Day Smoker -- 0.30 packs/day for 35 years    Types: Cigarettes  . Smokeless tobacco: Never Used  . Alcohol Use: Yes     Comment: occasionally    Family History  Problem Relation Age of Onset  . Hypertension Mother       Objective:   Filed Vitals:   10/29/13 1056  BP: 135/85  Pulse: 69  Temp: 98.3 F (36.8 C)   in no apparent distress and well developed and well nourished HEENT: anicteric Cor RRR and No murmurs clear Bowel sounds are normal, liver is not enlarged, spleen is not enlarged peripheral pulses normal, no pedal edema, no clubbing or cyanosis negative for -  jaundice, spider hemangioma, telangiectasia, palmar erythema, ecchymosis and atrophy  Laboratory Genotype:  Lab Results  Component Value Date   HCVGENOTYPE 1a 09/24/2013   HCV viral load:  Lab Results  Component Value Date   HCVQUANT [id number]* 09/24/2013   Lab Results  Component Value Date   WBC 4.7 07/02/2013   HGB 15.4 07/02/2013   HCT 43.8 07/02/2013   MCV 95.0 07/02/2013   PLT 183 07/02/2013    Lab Results  Component Value Date   CREATININE 1.20 07/02/2013   BUN 13 07/02/2013   NA 139 07/02/2013   K 3.9 07/02/2013   CL 108 07/02/2013   CO2 24 07/02/2013    Lab Results  Component Value Date   ALT 69* 07/02/2013   AST 58* 07/02/2013   ALKPHOS 67 07/02/2013   BILITOT 0.8 07/02/2013   INR 0.95 07/27/2010      Assessment: Hepatitis C genotype 1a  Plan: 1) Patient counseled extensively on limiting acetaminophen to no more than 2 grams daily, avoidance of alcohol. 2) Transmission discussed with patient including sexual transmission, sharing razors and toothbrush.   3) Will need referral to gastroenterology: no 4) Will need referral for substance abuse counseling: no 5) Will prescribe Harvoni for 12 weeks once work up complete and follow up after he gets it.   6) Hepatitis A vaccine yes 7) Hepatitis B vaccine no 8) Pneumovax vaccine no

## 2013-10-29 NOTE — Addendum Note (Signed)
Addended by: Andree CossHOWELL, MICHELLE M on: 10/29/2013 01:51 PM   Modules accepted: Orders

## 2013-10-29 NOTE — Patient Instructions (Addendum)
Date 10/29/2013  Dear [name], As discussed in the ID Clinic, your hepatitis C therapy, if obtained, will include the following medications:          Harvoni 90mg /400mg  tablet:           Take 1 tablet by mouth once daily   Please note that ALL MEDICATIONS WILL START ON THE SAME DATE for a total of _ weeks. ---------------------------------------------------------------- Your HCV Treatment Start Date: TBA   Your HCV genotype:  1a    Liver Fibrosis:    F2 from biopsy 2012 ---------------------------------------------------------------- YOUR PHARMAC[facility] CONTACT:   [facility] [address] Phone: [phone] Hours: Monday to Friday 7:30 am to 6:00 pm   Please always contact your pharmacy at least 3-4 business days before you run out of medications to ensure your next month's medication is ready or 1 week prior to running out if you receive it by mail.  Remember, each prescription is for 28 days. ----------------------------------------------------------------  SOFOSBUVIR/LEDIPASVIR (HARVONI): - Harvoni tablet is taken daily with OR without food. - The tablets are orange. - The tablets should be stored at room temperature.  - Acid reducing agents such as H2 blockers (ie. Pepcid (famotidine), Zantac (ranitidine), Tagamet (cimetidine), Axid (nizatidine) and proton pump inhibitors (ie. Prilosec (omeprazole), Protonix (pantoprazole), Nexium (esomeprazole), or Aciphex (rabeprazole)) can decrease effectiveness of Harvoni. Do not take until you have discussed with a health care provider.    -Antacids that contain magnesium and/or aluminum hydroxide (ie. Milk of Magensia, Rolaids, Gaviscon, Maalox, Mylanta, an dArthritis Pain Formula)can reduce absorption of Harvoni, so take them at least 4 hours before or after Harvoni.  -Calcium carbonate (calcium supplements or antacids such as Tums, Caltrate, Os-Cal)needs to be taken at  least 4 hours hours before or after Harvoni.  -St. John's wort or any products that contain St. John's wort like some herbal supplements  Please inform the office prior to starting any of these medications.  - The common side effects with Harvoni:      1. Fatigue      2. Headache      3. Nausea      4. Diarrhea      5. Insomnia   Support Path is a suite of resources designed to help patients start with HARVONI and move toward treatment completion GETTING STARTED Support Path helps patients access therapy and get off to an efficient start  Benefits investigation and prior authorization support Co-pay and other financial assistance A specialty pharmacy finder CO-PAY COUPON The HARVONI co-pay coupon may help eligible patients lower their out-of-pocket costs. With a co-pay coupon, most eligible patients may pay no more than $5 per co-pay (restrictions apply) www.harvoni.com call [phone] Not valid for patients enrolled in government healthcare prescription drug programs, such as Medicare Part D and Medicaid. Patients in the coverage gap known as the "donut hole" also are not eligible The HARVONI co-pay coupon program will cover the out-of-pocket costs for HARVONI prescriptions up to a maximum of 25% of the catalog price of a 12-week regimen of HARVONI  Please note that this only lists the most common side effects and is NOT a comprehensive list of the potential side effects of these medications. For more information, please review the drug information sheets that come with your medication package from the pharmacy.  ---------------------------------------------------------------- GENERAL HELPFUL HINTS ON HCV THERAPY: 1. No alcohol. 2. Protect against sun-sensitivity/sunburns (wear sunglasses, hat, long sleeves, pants and sunscreen).  3. Stay well-hydrated/well-moisturized. 4. Notify the ID Clinic of any changes in your other over-the-counter/herbal or prescription medications. 5.  If you miss a dose of your medication, take the missed dose as soon as you remember. Return to your regular time/dose schedule the next day.  6.  Do not stop taking your medications without first talking with your healthcare provider. 7.  You may take Tylenol (acetaminophen), as long as the dose is less than 2000 mg (OR no more than 4 tablets of the Tylenol Extra Strengths 500mg  tablet) in 24 hours. 8.  You will need to obtain routine labs and/or office visits at [facility] at weeks 2, 4, 8,  and 12 as well as 12 and 24 weeks after completion of treatment.  If you are not compliant with labs and office visits, we may discontinue HCV therapy.  Staci RighterOMER, Liset Mcmonigle, MD  [facility] [address] [place], [place]  [id number] [phone]

## 2013-10-30 LAB — ANA: Anti Nuclear Antibody(ANA): NEGATIVE

## 2013-11-06 ENCOUNTER — Ambulatory Visit (HOSPITAL_COMMUNITY)
Admission: RE | Admit: 2013-11-06 | Discharge: 2013-11-06 | Disposition: A | Discharge status: Home / Self Care | Payer: Medicaid Other | Source: Ambulatory Visit | Attending: Internal Medicine | Admitting: Internal Medicine

## 2013-11-06 DIAGNOSIS — Q619 Cystic kidney disease, unspecified: Secondary | ICD-10-CM | POA: Diagnosis not present

## 2013-11-06 DIAGNOSIS — B182 Chronic viral hepatitis C: Secondary | ICD-10-CM | POA: Insufficient documentation

## 2013-11-07 ENCOUNTER — Other Ambulatory Visit: Payer: Self-pay | Admitting: Internal Medicine

## 2013-11-07 MED ORDER — LEDIPASVIR-SOFOSBUVIR 90-400 MG PO TABS: 1.0000 | ORAL_TABLET | Freq: Every day | ORAL | Status: DC

## 2013-11-07 NOTE — Addendum Note (Signed)
Addended by: Gardiner BarefootOMER, ROBERT W on: 11/07/2013 01:02 PM   Modules accepted: Orders

## 2013-12-12 ENCOUNTER — Telehealth: Payer: Self-pay | Admitting: *Deleted

## 2013-12-12 NOTE — Telephone Encounter (Signed)
Spoke with patient. [facility], will start it today. Scheduled for follow up 9/3 at 10:00 (4 weeks). Andree CossHowell, Michelle M, RN

## 2013-12-12 NOTE — Telephone Encounter (Signed)
Message copied by Andree CossHOWELL, Vylet Maffia M on Wed Dec 12, 2013 11:47 AM ------      Message from: Gardiner BarefootOMER, ROBERT W      Created: Wed Dec 12, 2013 11:31 AM       [facility] pharmacy has already left him a message to pick it up.  He will need follow up with me about 4 weeks after he starts. Pharmacy will counsel him on how to take it.  thanks ------

## 2014-01-10 ENCOUNTER — Encounter: Payer: Self-pay | Admitting: Internal Medicine

## 2014-01-10 ENCOUNTER — Ambulatory Visit (INDEPENDENT_AMBULATORY_CARE_PROVIDER_SITE_OTHER): Payer: Medicaid Other | Admitting: Internal Medicine

## 2014-01-10 VITALS — BP 132/94 | HR 84 | Temp 98.5°F | Wt 200.0 lb

## 2014-01-10 DIAGNOSIS — B182 Chronic viral hepatitis C: Secondary | ICD-10-CM

## 2014-01-10 NOTE — Assessment & Plan Note (Signed)
Doing great.  Labs next week at 4 weeks.  Reminded to pick up meds that day as well.  rtc 5 weeks.

## 2014-01-10 NOTE — Progress Notes (Signed)
   Subjective:    Patient ID: [name] [name], male    DOB: 11/03/1955, 58 y.o.   MRN: [id number]  HPI Here for his follow up for Hep C genotype 1a, fibrosis F1/2 on Harvoni for about 3 weeks.  No issues at all, no fatigue, no headache.  Pleased with regimen.     Review of Systems  Constitutional: Negative for fatigue and unexpected weight change.  Gastrointestinal: Negative for diarrhea.  Skin: Negative for rash.  Neurological: Negative for dizziness and headaches.       Objective:   Physical Exam  Constitutional: He appears well-developed and well-nourished.  Eyes: No scleral icterus.  Cardiovascular: Normal rate, regular rhythm and normal heart sounds.   No murmur heard. Pulmonary/Chest: Effort normal and breath sounds normal. No respiratory distress. He has no wheezes.  Skin: No rash noted.          Assessment & Plan:

## 2014-02-07 ENCOUNTER — Other Ambulatory Visit: Payer: Medicaid Other

## 2014-02-12 ENCOUNTER — Other Ambulatory Visit: Payer: Medicaid Other

## 2014-02-12 DIAGNOSIS — B182 Chronic viral hepatitis C: Secondary | ICD-10-CM

## 2014-02-12 LAB — CBC WITH DIFFERENTIAL/PLATELET
Basophils Absolute: 0 10*3/uL (ref 0.0–0.1)
Basophils Relative: 0 % (ref 0–1)
EOS ABS: 0.2 10*3/uL (ref 0.0–0.7)
Eosinophils Relative: 3 % (ref 0–5)
HEMATOCRIT: 42.7 % (ref 39.0–52.0)
Hemoglobin: 14.4 g/dL (ref 13.0–17.0)
Lymphocytes Relative: 28 % (ref 12–46)
Lymphs Abs: 2 10*3/uL (ref 0.7–4.0)
MCH: 31.8 pg (ref 26.0–34.0)
MCHC: 33.7 g/dL (ref 30.0–36.0)
MCV: 94.3 fL (ref 78.0–100.0)
MONO ABS: 0.6 10*3/uL (ref 0.1–1.0)
Monocytes Relative: 9 % (ref 3–12)
Neutro Abs: 4.2 10*3/uL (ref 1.7–7.7)
Neutrophils Relative %: 60 % (ref 43–77)
PLATELETS: 163 10*3/uL (ref 150–400)
RBC: 4.53 MIL/uL (ref 4.22–5.81)
RDW: 12.8 % (ref 11.5–15.5)
WBC: 7 10*3/uL (ref 4.0–10.5)

## 2014-02-12 LAB — COMPLETE METABOLIC PANEL WITH GFR
ALT: 29 U/L (ref 0–53)
AST: 25 U/L (ref 0–37)
Albumin: 4.2 g/dL (ref 3.5–5.2)
Alkaline Phosphatase: 63 U/L (ref 39–117)
BILIRUBIN TOTAL: 0.9 mg/dL (ref 0.2–1.2)
BUN: 17 mg/dL (ref 6–23)
CALCIUM: 9.2 mg/dL (ref 8.4–10.5)
CO2: 27 mEq/L (ref 19–32)
CREATININE: 1.48 mg/dL — AB (ref 0.50–1.35)
Chloride: 103 mEq/L (ref 96–112)
GFR, Est African American: 61 mL/min
GFR, Est Non African American: 53 mL/min — ABNORMAL LOW
Glucose, Bld: 99 mg/dL (ref 70–99)
Potassium: 3.5 mEq/L (ref 3.5–5.3)
Sodium: 139 mEq/L (ref 135–145)
Total Protein: 7.5 g/dL (ref 6.0–8.3)

## 2014-02-13 ENCOUNTER — Other Ambulatory Visit: Payer: Medicaid Other

## 2014-02-13 LAB — HEPATITIS C RNA QUANTITATIVE: HCV Quantitative: NOT DETECTED IU/mL (ref <15)

## 2014-02-21 ENCOUNTER — Ambulatory Visit: Payer: Medicaid Other | Admitting: Internal Medicine

## 2014-02-25 ENCOUNTER — Ambulatory Visit: Payer: Medicaid Other | Admitting: Internal Medicine

## 2014-03-21 ENCOUNTER — Ambulatory Visit (INDEPENDENT_AMBULATORY_CARE_PROVIDER_SITE_OTHER): Payer: Medicaid Other | Admitting: Internal Medicine

## 2014-03-21 ENCOUNTER — Encounter: Payer: Self-pay | Admitting: Internal Medicine

## 2014-03-21 ENCOUNTER — Ambulatory Visit (INDEPENDENT_AMBULATORY_CARE_PROVIDER_SITE_OTHER): Payer: Medicaid Other | Admitting: *Deleted

## 2014-03-21 VITALS — BP 147/94 | HR 71 | Temp 98.1°F | Ht 73.0 in | Wt 210.0 lb

## 2014-03-21 DIAGNOSIS — Z23 Encounter for immunization: Secondary | ICD-10-CM

## 2014-03-21 DIAGNOSIS — B182 Chronic viral hepatitis C: Secondary | ICD-10-CM

## 2014-03-21 NOTE — Progress Notes (Signed)
   Subjective:    Patient ID: [name] [name], male    DOB: August 18, 1955, 58 y.o.   MRN: [id number]  HPI Here for his follow up for Hep C genotype 1a, fibrosis F1/2 on Harvoni and has now completed 12 weeks on 11/7.  No issues at all, no fatigue, no headache.  Pleased to have finished.  RVR is undetectable.     Review of Systems  Constitutional: Negative for fatigue and unexpected weight change.  Gastrointestinal: Negative for diarrhea.  Skin: Negative for rash.  Neurological: Negative for dizziness and headaches.       Objective:   Physical Exam  Constitutional: He appears well-developed and well-nourished. No distress.  HENT:  Mouth/Throat: No oropharyngeal exudate.  Eyes: No scleral icterus.  Cardiovascular: Normal rate, regular rhythm and normal heart sounds.   No murmur heard. Pulmonary/Chest: Effort normal and breath sounds normal. No respiratory distress.  Skin: No rash noted.          Assessment & Plan:

## 2014-03-21 NOTE — Assessment & Plan Note (Signed)
Doing great and undetectable so far.  Discussed that small percent rebound after 3 months.  HCV vl today and rtc 3 months for repeat vl.   Does not need screening ultrasound.

## 2014-03-22 LAB — HEPATITIS C RNA QUANTITATIVE: HCV QUANT: NOT DETECTED [IU]/mL (ref <15)

## 2014-04-22 ENCOUNTER — Ambulatory Visit: Payer: Medicaid Other

## 2014-05-20 ENCOUNTER — Observation Stay (HOSPITAL_COMMUNITY)
Admission: AD | Admit: 2014-05-20 | Discharge: 2014-05-21 | Disposition: A | Discharge status: Home / Self Care | Payer: Medicaid Other | Source: Intra-hospital | Attending: Psychiatry | Admitting: Psychiatry

## 2014-05-20 ENCOUNTER — Encounter (HOSPITAL_COMMUNITY): Payer: Self-pay | Admitting: *Deleted

## 2014-05-20 ENCOUNTER — Emergency Department (HOSPITAL_COMMUNITY)
Admission: EM | Admit: 2014-05-20 | Discharge: 2014-05-20 | Disposition: A | Discharge status: Home / Self Care | Payer: Medicaid Other | Attending: Emergency Medicine | Admitting: Emergency Medicine

## 2014-05-20 ENCOUNTER — Encounter (HOSPITAL_COMMUNITY): Payer: Self-pay | Admitting: Emergency Medicine

## 2014-05-20 DIAGNOSIS — Z888 Allergy status to other drugs, medicaments and biological substances status: Secondary | ICD-10-CM | POA: Diagnosis not present

## 2014-05-20 DIAGNOSIS — F1210 Cannabis abuse, uncomplicated: Secondary | ICD-10-CM | POA: Insufficient documentation

## 2014-05-20 DIAGNOSIS — I10 Essential (primary) hypertension: Secondary | ICD-10-CM | POA: Insufficient documentation

## 2014-05-20 DIAGNOSIS — F1010 Alcohol abuse, uncomplicated: Secondary | ICD-10-CM | POA: Diagnosis not present

## 2014-05-20 DIAGNOSIS — Z8739 Personal history of other diseases of the musculoskeletal system and connective tissue: Secondary | ICD-10-CM | POA: Diagnosis not present

## 2014-05-20 DIAGNOSIS — M13851 Other specified arthritis, right hip: Secondary | ICD-10-CM | POA: Insufficient documentation

## 2014-05-20 DIAGNOSIS — F10220 Alcohol dependence with intoxication, uncomplicated: Secondary | ICD-10-CM | POA: Diagnosis not present

## 2014-05-20 DIAGNOSIS — F1020 Alcohol dependence, uncomplicated: Secondary | ICD-10-CM | POA: Diagnosis present

## 2014-05-20 DIAGNOSIS — F1410 Cocaine abuse, uncomplicated: Secondary | ICD-10-CM | POA: Diagnosis not present

## 2014-05-20 DIAGNOSIS — Z720 Tobacco use: Secondary | ICD-10-CM | POA: Diagnosis not present

## 2014-05-20 DIAGNOSIS — M5126 Other intervertebral disc displacement, lumbar region: Secondary | ICD-10-CM | POA: Diagnosis not present

## 2014-05-20 DIAGNOSIS — F17210 Nicotine dependence, cigarettes, uncomplicated: Secondary | ICD-10-CM | POA: Insufficient documentation

## 2014-05-20 DIAGNOSIS — F1910 Other psychoactive substance abuse, uncomplicated: Secondary | ICD-10-CM

## 2014-05-20 DIAGNOSIS — Z8619 Personal history of other infectious and parasitic diseases: Secondary | ICD-10-CM | POA: Diagnosis not present

## 2014-05-20 LAB — BASIC METABOLIC PANEL
Anion gap: 7 (ref 5–15)
BUN: 20 mg/dL (ref 6–23)
CHLORIDE: 106 meq/L (ref 96–112)
CO2: 26 mmol/L (ref 19–32)
Calcium: 9.8 mg/dL (ref 8.4–10.5)
Creatinine, Ser: 1.46 mg/dL — ABNORMAL HIGH (ref 0.50–1.35)
GFR calc non Af Amer: 52 mL/min — ABNORMAL LOW (ref >90)
GFR, EST AFRICAN AMERICAN: 60 mL/min — AB (ref >90)
Glucose, Bld: 107 mg/dL — ABNORMAL HIGH (ref 70–99)
Potassium: 4.6 mmol/L (ref 3.5–5.1)
Sodium: 139 mmol/L (ref 135–145)

## 2014-05-20 LAB — URINE MICROSCOPIC-ADD ON

## 2014-05-20 LAB — CBC WITH DIFFERENTIAL/PLATELET
Basophils Absolute: 0 10*3/uL (ref 0.0–0.1)
Basophils Relative: 0 % (ref 0–1)
Eosinophils Absolute: 0.2 10*3/uL (ref 0.0–0.7)
Eosinophils Relative: 2 % (ref 0–5)
HCT: 46.9 % (ref 39.0–52.0)
Hemoglobin: 15.5 g/dL (ref 13.0–17.0)
LYMPHS ABS: 2.8 10*3/uL (ref 0.7–4.0)
LYMPHS PCT: 35 % (ref 12–46)
MCH: 33 pg (ref 26.0–34.0)
MCHC: 33 g/dL (ref 30.0–36.0)
MCV: 100 fL (ref 78.0–100.0)
MONOS PCT: 10 % (ref 3–12)
Monocytes Absolute: 0.8 10*3/uL (ref 0.1–1.0)
NEUTROS ABS: 4.3 10*3/uL (ref 1.7–7.7)
Neutrophils Relative %: 53 % (ref 43–77)
PLATELETS: 165 10*3/uL (ref 150–400)
RBC: 4.69 MIL/uL (ref 4.22–5.81)
RDW: 12.9 % (ref 11.5–15.5)
WBC: 8.1 10*3/uL (ref 4.0–10.5)

## 2014-05-20 LAB — URINALYSIS, ROUTINE W REFLEX MICROSCOPIC
BILIRUBIN URINE: NEGATIVE
Glucose, UA: NEGATIVE mg/dL
KETONES UR: NEGATIVE mg/dL
Leukocytes, UA: NEGATIVE
Nitrite: NEGATIVE
Protein, ur: NEGATIVE mg/dL
Specific Gravity, Urine: 1.016 (ref 1.005–1.030)
Urobilinogen, UA: 1 mg/dL (ref 0.0–1.0)
pH: 5 (ref 5.0–8.0)

## 2014-05-20 LAB — RAPID URINE DRUG SCREEN, HOSP PERFORMED
Amphetamines: NOT DETECTED
BARBITURATES: NOT DETECTED
BENZODIAZEPINES: NOT DETECTED
Cocaine: POSITIVE — AB
Opiates: NOT DETECTED
TETRAHYDROCANNABINOL: POSITIVE — AB

## 2014-05-20 LAB — ETHANOL

## 2014-05-20 MED ORDER — ACETAMINOPHEN 325 MG PO TABS: 650.0000 mg | ORAL_TABLET | Freq: Four times a day (QID) | ORAL | Status: DC | PRN

## 2014-05-20 MED ORDER — LORAZEPAM 1 MG PO TABS
1.0000 mg | ORAL_TABLET | ORAL | Status: DC | PRN
  Administered 2014-05-20: 1 mg via ORAL
  Filled 2014-05-20: qty 1

## 2014-05-20 MED ORDER — NICOTINE 21 MG/24HR TD PT24
21.0000 mg | MEDICATED_PATCH | Freq: Every day | TRANSDERMAL | Status: DC
  Administered 2014-05-20 – 2014-05-21 (×2): 21 mg via TRANSDERMAL
  Filled 2014-05-20 (×5): qty 1

## 2014-05-20 MED ORDER — TRAZODONE HCL 50 MG PO TABS
50.0000 mg | ORAL_TABLET | Freq: Every evening | ORAL | Status: DC | PRN
  Administered 2014-05-20: 50 mg via ORAL
  Filled 2014-05-20 (×5): qty 1

## 2014-05-20 MED ORDER — ALUM & MAG HYDROXIDE-SIMETH 200-200-20 MG/5ML PO SUSP: 30.0000 mL | ORAL | Status: DC | PRN

## 2014-05-20 MED ORDER — MAGNESIUM HYDROXIDE 400 MG/5ML PO SUSP: 30.0000 mL | Freq: Every day | ORAL | Status: DC | PRN

## 2014-05-20 NOTE — ED Provider Notes (Signed)
CSN: [id number]     Arrival date & time 05/20/14  4098 History   First MD Initiated Contact with Patient 05/20/14 1009     Chief Complaint  Patient presents with  . detox      (Consider location/radiation/quality/duration/timing/severity/associated sxs/prior Treatment) HPI  [name] [name] is a 59 y.o. male states that he needs help to stay away from alcohol, narcotics and other drugs, and is seeking a 28 day rehabilitation admission.  He drinks about 2 sixpacks of beer daily.  He denies history of delirium tremens, or severe alcohol withdrawal symptoms.  He sees his doctor routinely for his care of hepatitis C, and is doing well, with that.  He denies recent fever, chills, nausea, vomiting, weakness or dizziness.  There are no other known modifying factors.  Past Medical History  Diagnosis Date  . Hypertension     Takes medications daily  . Arthritis     right hip  . Disc displacement, lumbar    Past Surgical History  Procedure Laterality Date  . Back surgery  1980s    x2:2013one back surgery  . Colonoscopy with propofol N/A 09/18/2013    Procedure: COLONOSCOPY WITH PROPOFOL;  Surgeon: Charolett Bumpers, MD;  Location: [facility];  Service: Endoscopy;  Laterality: N/A;   Family History  Problem Relation Age of Onset  . Hypertension Mother    History  Substance Use Topics  . Smoking status: Current Every Day Smoker -- 0.30 packs/day for 35 years    Types: Cigarettes  . Smokeless tobacco: Never Used  . Alcohol Use: 0.0 oz/week    0 Not specified per week     Comment: occasionally    Review of Systems  All other systems reviewed and are negative.     Allergies  Hctz  Home Medications   Prior to Admission medications   Medication Sig Start Date End Date Taking? Authorizing Provider  amLODipine (NORVASC) 10 MG tablet Take 10 mg by mouth every morning.   Yes Historical Provider, MD   BP 140/78 mmHg  Pulse 64  Temp(Src) 98.4 F (36.9 C) (Oral)  Resp 16  SpO2  100% Physical Exam  Constitutional: He is oriented to person, place, and time. He appears well-developed and well-nourished. No distress.  HENT:  Head: Normocephalic and atraumatic.  Right Ear: External ear normal.  Left Ear: External ear normal.  Eyes: Conjunctivae and EOM are normal. Pupils are equal, round, and reactive to light.  Neck: Normal range of motion and phonation normal. Neck supple.  Cardiovascular: Normal rate, regular rhythm and normal heart sounds.   Pulmonary/Chest: Effort normal and breath sounds normal. He exhibits no bony tenderness.  Abdominal: Soft. There is no tenderness.  Musculoskeletal: Normal range of motion.  Neurological: He is alert and oriented to person, place, and time. No cranial nerve deficit or sensory deficit. He exhibits normal muscle tone. Coordination normal.  No dysarthria and aphasia or nystagmus.  There is no tremor.  Skin: Skin is warm, dry and intact.  Psychiatric: He has a normal mood and affect. His behavior is normal. Judgment and thought content normal.  He is lucid, calm, and cooperative.  Nursing note and vitals reviewed.   ED Course  Procedures (including critical care time)  Medications - No data to display  Patient Vitals for the past 24 hrs:  BP Temp Temp src Pulse Resp SpO2  05/20/14 1351 140/78 mmHg 98.4 F (36.9 C) - 64 16 100 %  05/20/14 1250 146/81 mmHg 98.5 F (36.9  C) Oral 65 16 98 %  05/20/14 1239 92/70 mmHg - - - - -  05/20/14 0920 149/79 mmHg 97.9 F (36.6 C) Oral 77 16 98 %   Consult TTS, they arranged [facility]   Labs Review Labs Reviewed  BASIC METABOLIC PANEL - Abnormal; Notable for the following:    Glucose, Bld 107 (*)    Creatinine, Ser 1.46 (*)    GFR calc non Af Amer 52 (*)    GFR calc Af Amer 60 (*)    All other components within normal limits  URINALYSIS, ROUTINE W REFLEX MICROSCOPIC - Abnormal; Notable for the following:    Hgb urine dipstick TRACE (*)     All other components within normal limits  URINE RAPID DRUG SCREEN (HOSP PERFORMED) - Abnormal; Notable for the following:    Cocaine POSITIVE (*)    Tetrahydrocannabinol POSITIVE (*)    All other components within normal limits  ETHANOL  CBC WITH DIFFERENTIAL  URINE MICROSCOPIC-ADD ON    Imaging Review No results found.   EKG Interpretation None      MDM   Final diagnoses:  Alcohol dependence with uncomplicated intoxication  Cocaine abuse    Nursing Notes Reviewed/ Care Coordinated, and agree without changes. Applicable Imaging Reviewed.  Interpretation of Laboratory Data incorporated into ED treatment  Plan: Admit to psychiatric hospital    Flint MelterElliott L Brownie Nehme, MD 05/20/14 1725

## 2014-05-20 NOTE — ED Notes (Signed)
[facility]

## 2014-05-20 NOTE — Progress Notes (Signed)
Patient ID: [name] [name], male   DOB: 1956/01/31, 59 y.o.   MRN: [id number] Nursing admission note:  [facility].  Patient denies any SI/HI/AVH.  He denies any physical pain.  Patient receives outpatient services from [facility] twice a week.  Patient states that they are not helping him.  He reports drinking approx. 1 40 oz per day.  He also smokes $100.00 worth of crack twice a week.  Patient states that he has not had any periods of sobriety and has been using since he was 59 years old.  Patient is pleasant, cooperative and talkative.  He is requesting long term treatment.  Patient states, "you might as well let me go now if you are going to d/c me within 24 hours."  "I'm not getting help from my outpatient services."  Medical hx includes HTN, back surgery and arthritis.  He is steady on his feet with no hx of falls.  Patient's belongings procured and patient was oriented to OBS unitl.

## 2014-05-20 NOTE — ED Notes (Signed)
TTS consulting with patient. 

## 2014-05-20 NOTE — Progress Notes (Signed)
Pt alert and cooperative. -SI/HI, -A/vhall, verbally contracts for safety. Denies pain or physical discomfort. Emotional support and encouragement given. Will continue to monitor closely and evaluate for stabilization.

## 2014-05-20 NOTE — ED Notes (Signed)
Pt requesting detox from ETOH, oxycodone, THC and "whatever else I can get m hands on".  Pt states that he uses at least 2-6packs of ETOH/day.  Pt states that he has been addicted to oxycodone since his back surgery and then couldn't get anymore so he turned to ETOH and other drugs to help with the pain.

## 2014-05-20 NOTE — BH Assessment (Signed)
Assessment Note  [name] [name] is an 59 y.o. male. Pt arrived at [facility] ED voluntarily. Pt requests alcohol and crack cocaine detox. Pt denies SI/HI. Pt denies delusions and hallucinations. Pt states that he experiences severe pain from a back surgery he had last year. Pt reports that he was prescribed Oxycodone after his surgery but they will no longer give it him. According to the Pt, he currently self-medicates with alcohol, marijuana, and crack cocaine. The pt reports that he drinks 2/3 40 oz beers a day. Pt admits to smoking a $100 of crack cocaine 2x a week. Pt states that he is currently receiving outpatient treatment at [facility]. Pt states that he receives outpatient treatment 3xs a week. Pt states that outpatient therapy is not helping him at this time. Pt reports no prior inpatient treatment. Pt states he experiences sweating when he does not drink. Pt reports financial problems also cause him to drink and use alcohol.   Writer consulted with Julieanne Cotton NP. Per Josephine Pt meets Observation criteria. Pt accepted to Obs bed 6.   Axis I: Substance Abuse Axis II: Deferred Axis III:  Past Medical History  Diagnosis Date  . Hypertension     Takes medications daily  . Arthritis     right hip  . Disc displacement, lumbar    Axis IV: economic problems, problems with access to health care services and problems with primary support group Axis V: 31-40 impairment in reality testing  Past Medical History:  Past Medical History  Diagnosis Date  . Hypertension     Takes medications daily  . Arthritis     right hip  . Disc displacement, lumbar     Past Surgical History  Procedure Laterality Date  . Back surgery  1980s    x2:2013one back surgery  . Colonoscopy with propofol N/A 09/18/2013    Procedure: COLONOSCOPY WITH PROPOFOL;  Surgeon: Charolett Bumpers, MD;  Location: [facility];  Service: Endoscopy;  Laterality: N/A;    Family History:  Family History  Problem Relation  Age of Onset  . Hypertension Mother     Social History:  reports that he has been smoking Cigarettes.  He has a 10.5 pack-year smoking history. He has never used smokeless tobacco. He reports that he drinks alcohol. He reports that he uses illicit drugs (Marijuana).  Additional Social History:  Alcohol / Drug Use Pain Medications: Pt denies Prescriptions: Pt denies Over the Counter: Pt denies History of alcohol / drug use?: Yes Longest period of sobriety (when/how long): Unknown Negative Consequences of Use: Financial, Legal, Personal relationships, Work / School Withdrawal Symptoms: Sweats Substance #1 Name of Substance 1: Alcohol 1 - Age of First Use: 15 1 - Amount (size/oz): 2-3 40oz beers 1 - Frequency: Daily 1 - Duration: Ongoing 1 - Last Use / Amount: 05/20/14 Substance #2 Name of Substance 2: Crack Cocaine 2 - Age of First Use: 56 2 - Amount (size/oz): $100 2 - Frequency: 2x a week 2 - Duration: Ongoing 2 - Last Use / Amount: 05/19/14  CIWA: CIWA-Ar BP: 149/79 mmHg Pulse Rate: 77 COWS:    Allergies:  Allergies  Allergen Reactions  . Hctz [Hydrochlorothiazide]     Erectile dysfunction    Home Medications:  (Not in a hospital admission)  OB/GYN Status:  No LMP for male patient.  General Assessment Data Location of Assessment: [facility] ED ACT Assessment: Yes Is this a Tele or Face-to-Face Assessment?: Face-to-Face Is this an Initial Assessment or a Re-assessment  for this encounter?: Initial Assessment Living Arrangements: Alone Can pt return to current living arrangement?: Yes Admission Status: Voluntary Is patient capable of signing voluntary admission?: Yes Transfer from: Home Referral Source: Self/Family/Friend     [facility] Living Arrangements: Alone Name of Psychiatrist: None Name of Therapist: None  Education Status Is patient currently in school?: No Current Grade: NA Highest grade of school patient has completed: NA Name of school:  None Contact person: NA  Risk to self with the past 6 months Suicidal Ideation: No Suicidal Intent: No Is patient at risk for suicide?: No Suicidal Plan?: No Access to Means: No What has been your use of drugs/alcohol within the last 12 months?: 05/20/14 Previous Attempts/Gestures: No How many times?: 0 Other Self Harm Risks: None Triggers for Past Attempts: None known Intentional Self Injurious Behavior: None Family Suicide History: Unknown Recent stressful life event(s): Financial Problems Persecutory voices/beliefs?: No Depression: Yes Depression Symptoms: Feeling angry/irritable, Feeling worthless/self pity, Loss of interest in usual pleasures Substance abuse history and/or treatment for substance abuse?: Yes Suicide prevention information given to non-admitted patients: Not applicable  Risk to Others within the past 6 months Homicidal Ideation: No Thoughts of Harm to Others: No Current Homicidal Intent: No Current Homicidal Plan: No Access to Homicidal Means: No Identified Victim: None History of harm to others?: No Assessment of Violence: None Noted Violent Behavior Description: None Does patient have access to weapons?: No Criminal Charges Pending?: No Does patient have a court date: No  Psychosis Hallucinations: None noted Delusions: None noted  Mental Status Report Appear/Hygiene: Unremarkable Eye Contact: Good Motor Activity: Freedom of movement Speech: Logical/coherent Level of Consciousness: Alert Mood: Depressed, Euthymic Affect: Appropriate to circumstance Anxiety Level: Minimal Thought Processes: Coherent, Relevant Judgement: Unimpaired Orientation: Person, Place, Time, Situation Obsessive Compulsive Thoughts/Behaviors: None  Cognitive Functioning Concentration: Normal Memory: Recent Intact, Remote Intact IQ: Average Insight: Good Impulse Control: Good Appetite: Fair Weight Loss: 0 Weight Gain: 0 Sleep: No Change Total Hours of Sleep:  7 Vegetative Symptoms: None  ADLScreening [facility] Patient's cognitive ability adequate to safely complete daily activities?: Yes Patient able to express need for assistance with ADLs?: Yes Independently performs ADLs?: Yes (appropriate for developmental age)  Prior Inpatient Therapy Prior Inpatient Therapy: No Prior Therapy Dates: NA Prior Therapy Facilty/Provider(s): NA Reason for Treatment: NA  Prior Outpatient Therapy Prior Outpatient Therapy: Yes Prior Therapy Dates: 2015 [facility] Reason for Treatment: Alcohol Treatment  ADL Screening (condition at time of admission) Patient's cognitive ability adequate to safely complete daily activities?: Yes Is the patient deaf or have difficulty hearing?: No Does the patient have difficulty seeing, even when wearing glasses/contacts?: No Does the patient have difficulty concentrating, remembering, or making decisions?: No Patient able to express need for assistance with ADLs?: Yes Does the patient have difficulty dressing or bathing?: No Independently performs ADLs?: Yes (appropriate for developmental age) Does the patient have difficulty walking or climbing stairs?: No Weakness of Legs: None Weakness of Arms/Hands: None       Abuse/Neglect Assessment (Assessment to be complete while patient is alone) Physical Abuse: Denies Verbal Abuse: Denies Sexual Abuse: Denies Exploitation of patient/patient's resources: Denies Self-Neglect: Denies Values / Beliefs Cultural Requests During Hospitalization: None Spiritual Requests During Hospitalization: None   Advance Directives (For Healthcare) Does patient have an advance directive?: No Would patient like information on creating an advanced directive?: No - patient declined information    Additional Information 1:1 In Past 12 Months?: No  CIRT Risk: No Elopement Risk: No Does patient have medical clearance?: Yes      Disposition:  Disposition Initial Assessment Completed for this Encounter: Yes Disposition of Patient: Other dispositions (Observation) Other disposition(s):  (Observation)  On Site Evaluation by:   Reviewed with Physician:    Emmit Pomfret 05/20/2014 12:25 PM

## 2014-05-21 DIAGNOSIS — F1010 Alcohol abuse, uncomplicated: Secondary | ICD-10-CM | POA: Diagnosis not present

## 2014-05-21 MED ORDER — AMLODIPINE BESYLATE 10 MG PO TABS: 10.0000 mg | ORAL_TABLET | Freq: Every morning | ORAL | Status: DC

## 2014-05-21 NOTE — Progress Notes (Signed)
CSW met with Pt and informed him that a referral had been made to [facility], but no decision has been made. CSW advised Pt that he could follow-up with [facility] on his own after discharge. Provided with resources. Pt declined further outpatient referral.  Peri Maris, [facility] 05/21/2014 12:49 PM

## 2014-05-21 NOTE — Discharge Summary (Signed)
[facility] OBS UNIT DISCHARGE SUMMARY  [name] [name] is an 59 y.o. male. Total Time spent with patient: 25 minutes  Assessment: AXIS I:  Alcohol Abuse and Substance Abuse AXIS II:  Deferred AXIS III:   Past Medical History  Diagnosis Date  . Hypertension     Takes medications daily  . Arthritis     right hip  . Disc displacement, lumbar    AXIS IV:  other psychosocial or environmental problems and problems related to social environment AXIS V:  51-60 moderate symptoms  Plan:   SEE BELOW  Subjective:   [name] [name] is a 59 y.o. male patient admitted with request for detox. Pt denies SI, HI, and AVH, contracts for safety upon arrival. Pt continues to state the same. Pt spent the night in the [facility] OBS Unit without incident and his CIWA scores have been consistently below 5. His VS are stable as well. Pt would like outpatient referrals and resources. He appears to be asymptomatic from a withdrawal standpoint at this time.   HPI:   [name] [name] is an 59 y.o. male. Pt arrived at [facility] ED voluntarily. Pt requests alcohol and crack cocaine detox. Pt denies SI/HI. Pt denies delusions and hallucinations. Pt states that he experiences severe pain from a back surgery he had last year. Pt reports that he was prescribed Oxycodone after his surgery but they will no longer give it him. According to the Pt, he currently self-medicates with alcohol, marijuana, and crack cocaine. The pt reports that he drinks 2/3 40 oz beers a day. Pt admits to smoking a $100 of crack cocaine 2x a week. Pt states that he is currently receiving outpatient treatment at [facility]. Pt states that he receives outpatient treatment 3xs a week. Pt states that outpatient therapy is not helping him at this time. Pt reports no prior inpatient treatment. Pt states he experiences sweating when he does not drink. Pt reports financial problems also cause him to drink and use alcohol.   Writer consulted with Reginold Agent NP. Per Josephine  Pt meets Observation criteria. Pt accepted to Obs bed 6.    Past Psychiatric History: Past Medical History  Diagnosis Date  . Hypertension     Takes medications daily  . Arthritis     right hip  . Disc displacement, lumbar     reports that he has been smoking Cigarettes.  He has a 10.5 pack-year smoking history. He has never used smokeless tobacco. He reports that he drinks alcohol. He reports that he uses illicit drugs (Marijuana and Cocaine). Family History  Problem Relation Age of Onset  . Hypertension Mother          Abuse/Neglect [facility]) Physical Abuse: Denies Verbal Abuse: Denies Sexual Abuse: Denies Allergies:   Allergies  Allergen Reactions  . Hctz [Hydrochlorothiazide]     Erectile dysfunction    ACT Assessment Complete:  Yes:    Educational Status    Risk to Self:    Risk to Others:    Abuse: Abuse/Neglect Assessment (Assessment to be complete while patient is alone) Physical Abuse: Denies Verbal Abuse: Denies Sexual Abuse: Denies Exploitation of patient/patient's resources: Denies Self-Neglect: Denies  Prior Inpatient Therapy:    Prior Outpatient Therapy:    Additional Information:      Objective: Blood pressure 155/83, pulse 103, temperature 98 F (36.7 C), temperature source Oral, resp. rate 18, height _0  (1.854 m), weight 93.441 kg (206 lb), SpO2 99 %.Body mass index is 27.18 kg/(m^2). Results for  orders placed or performed during the hospital encounter of 05/20/14 (from the past 72 hour(s))  Ethanol     Status: None   Collection Time: 05/20/14 11:09 AM  Result Value Ref Range   Alcohol, Ethyl (B) <5 0 - 9 mg/dL    Comment:        LOWEST DETECTABLE LIMIT FOR SERUM ALCOHOL IS 11 mg/dL FOR MEDICAL PURPOSES ONLY   Basic metabolic panel     Status: Abnormal   Collection Time: 05/20/14 11:14 AM  Result Value Ref Range   Sodium 139 135 - 145 mmol/L    Comment: Please note change in reference range.   Potassium 4.6 3.5 - 5.1 mmol/L    Comment:  Please note change in reference range.   Chloride 106 96 - 112 mEq/L   CO2 26 19 - 32 mmol/L   Glucose, Bld 107 (H) 70 - 99 mg/dL   BUN 20 6 - 23 mg/dL   Creatinine, Ser 1.46 (H) 0.50 - 1.35 mg/dL   Calcium 9.8 8.4 - 10.5 mg/dL   GFR calc non Af Amer 52 (L) >90 mL/min   GFR calc Af Amer 60 (L) >90 mL/min    Comment: (NOTE) The eGFR has been calculated using the CKD EPI equation. This calculation has not been validated in all clinical situations. eGFR's persistently <90 mL/min signify possible Chronic Kidney Disease.    Anion gap 7 5 - 15  CBC with Differential     Status: None   Collection Time: 05/20/14 11:14 AM  Result Value Ref Range   WBC 8.1 4.0 - 10.5 K/uL   RBC 4.69 4.22 - 5.81 MIL/uL   Hemoglobin 15.5 13.0 - 17.0 g/dL   HCT 46.9 39.0 - 52.0 %   MCV 100.0 78.0 - 100.0 fL   MCH 33.0 26.0 - 34.0 pg   MCHC 33.0 30.0 - 36.0 g/dL   RDW 12.9 11.5 - 15.5 %   Platelets 165 150 - 400 K/uL   Neutrophils Relative % 53 43 - 77 %   Neutro Abs 4.3 1.7 - 7.7 K/uL   Lymphocytes Relative 35 12 - 46 %   Lymphs Abs 2.8 0.7 - 4.0 K/uL   Monocytes Relative 10 3 - 12 %   Monocytes Absolute 0.8 0.1 - 1.0 K/uL   Eosinophils Relative 2 0 - 5 %   Eosinophils Absolute 0.2 0.0 - 0.7 K/uL   Basophils Relative 0 0 - 1 %   Basophils Absolute 0.0 0.0 - 0.1 K/uL  Urinalysis, Routine w reflex microscopic     Status: Abnormal   Collection Time: 05/20/14 12:19 PM  Result Value Ref Range   Color, Urine YELLOW YELLOW   APPearance CLEAR CLEAR   Specific Gravity, Urine 1.016 1.005 - 1.030   pH 5.0 5.0 - 8.0   Glucose, UA NEGATIVE NEGATIVE mg/dL   Hgb urine dipstick TRACE (A) NEGATIVE   Bilirubin Urine NEGATIVE NEGATIVE   Ketones, ur NEGATIVE NEGATIVE mg/dL   Protein, ur NEGATIVE NEGATIVE mg/dL   Urobilinogen, UA 1.0 0.0 - 1.0 mg/dL   Nitrite NEGATIVE NEGATIVE   Leukocytes, UA NEGATIVE NEGATIVE  Urine rapid drug screen (hosp performed)     Status: Abnormal   Collection Time: 05/20/14 12:19 PM   Result Value Ref Range   Opiates NONE DETECTED NONE DETECTED   Cocaine POSITIVE (A) NONE DETECTED   Benzodiazepines NONE DETECTED NONE DETECTED   Amphetamines NONE DETECTED NONE DETECTED   Tetrahydrocannabinol POSITIVE (A) NONE DETECTED   Barbiturates  NONE DETECTED NONE DETECTED    Comment:        DRUG SCREEN FOR MEDICAL PURPOSES ONLY.  IF CONFIRMATION IS NEEDED FOR ANY PURPOSE, NOTIFY LAB WITHIN 5 DAYS.        LOWEST DETECTABLE LIMITS FOR URINE DRUG SCREEN Drug Class       Cutoff (ng/mL) Amphetamine      1000 Barbiturate      200 Benzodiazepine   128 Tricyclics       208 Opiates          300 Cocaine          300 THC              50   Urine microscopic-add on     Status: None   Collection Time: 05/20/14 12:19 PM  Result Value Ref Range   RBC / HPF 0-2 <3 RBC/hpf   Labs are reviewed and are pertinent for Cocaine, THC, BAL negative.  No current facility-administered medications for this encounter.    Psychiatric Specialty Exam:     BP 136/84 mmHg  Pulse 65  Temp(Src) 98 F (36.7 C) (Oral)  Resp 18  Ht _0  (1.854 m)  Wt 93.441 kg (206 lb)  BMI 27.18 kg/m2  SpO2 99%   General Appearance: Casual and Fairly Groomed  Engineer, water::  Good  Speech:  Clear and Coherent and Normal Rate  Volume:  Normal  Mood:  Euthymic  Affect:  Appropriate and Congruent  Thought Process:  Coherent and Goal Directed  Orientation:  Full (Time, Place, and Person)  Thought Content:  WDL  Suicidal Thoughts:  No  Homicidal Thoughts:  No  Memory:  Immediate;   Fair Recent;   Fair Remote;   Fair  Judgement:  Good  Insight:  Good  Psychomotor Activity:  Normal  Concentration:  Good  Recall:  Good  Fund of Knowledge:Good  Language: Good  Akathisia:  No  Handed:    AIMS (if indicated):     Assets:  Communication Skills Desire for Improvement Resilience Social Support  Sleep:      Musculoskeletal: Strength & Muscle Tone: within normal limits  Gait & Station: normal Patient  leans: N/A  Treatment Plan Summary: -Discharge home with outpatient referrals to psychiatry/counseling. [facility] to assist.  Benjamine Mola, FNP-BC 05/21/2014 9:32 AM

## 2014-05-21 NOTE — Progress Notes (Signed)
Patient ID: [name] DuboisWalter [name], male   DOB: [birth year], 59 y.o.   MRN: [id number] Nursing discharge note:  Patient was given outside resources from case management.  [facility].  Patient will also follow up with his outpatient provider[facility].  Patient denies any SI/HI/AVH.  He received all his personal belongings and left ambulatory with a friend.

## 2014-05-21 NOTE — H&P (Signed)
[facility] OBS UNIT H&P  [name] [name] is an 59 y.o. male. Total Time spent with patient: 45 minutes  Assessment: AXIS I:  Alcohol Abuse and Substance Abuse AXIS II:  Deferred AXIS III:   Past Medical History  Diagnosis Date  . Hypertension     Takes medications daily  . Arthritis     right hip  . Disc displacement, lumbar    AXIS IV:  other psychosocial or environmental problems and problems related to social environment AXIS V:  51-60 moderate symptoms  Plan:  No evidence of imminent risk to self or others at present.   Patient does not meet criteria for psychiatric inpatient admission. Supportive therapy provided about ongoing stressors. Refer to IOP. Discussed crisis plan, support from social network, calling 911, coming to the Emergency Department, and calling Suicide Hotline.  Subjective:   [name] [name] is a 59 y.o. male patient admitted with request for detox. Pt denies SI, HI, and AVH, contracts for safety. Pt is open to inpatient or outpatient treatment, whichever is available first. [facility] to send referals for long-term rehab. Pt to spend the night for CIWA and stabilization. If no beds available tomorrow, pt to discharge mid-day.   HPI:   [name] [name] is an 59 y.o. male. Pt arrived at [facility] ED voluntarily. Pt requests alcohol and crack cocaine detox. Pt denies SI/HI. Pt denies delusions and hallucinations. Pt states that he experiences severe pain from a back surgery he had last year. Pt reports that he was prescribed Oxycodone after his surgery but they will no longer give it him. According to the Pt, he currently self-medicates with alcohol, marijuana, and crack cocaine. The pt reports that he drinks 2/3 40 oz beers a day. Pt admits to smoking a $100 of crack cocaine 2x a week. Pt states that he is currently receiving outpatient treatment at [facility]. Pt states that he receives outpatient treatment 3xs a week. Pt states that outpatient therapy is not helping him at this  time. Pt reports no prior inpatient treatment. Pt states he experiences sweating when he does not drink. Pt reports financial problems also cause him to drink and use alcohol.   Writer consulted with Reginold Agent NP. Per Josephine Pt meets Observation criteria. Pt accepted to Obs bed 6.    Past Psychiatric History: Past Medical History  Diagnosis Date  . Hypertension     Takes medications daily  . Arthritis     right hip  . Disc displacement, lumbar     reports that he has been smoking Cigarettes.  He has a 10.5 pack-year smoking history. He has never used smokeless tobacco. He reports that he drinks alcohol. He reports that he uses illicit drugs (Marijuana and Cocaine). Family History  Problem Relation Age of Onset  . Hypertension Mother          Abuse/Neglect [facility]) Physical Abuse: Denies Verbal Abuse: Denies Sexual Abuse: Denies Allergies:   Allergies  Allergen Reactions  . Hctz [Hydrochlorothiazide]     Erectile dysfunction    ACT Assessment Complete:  Yes:    Educational Status    Risk to Self:    Risk to Others:    Abuse: Abuse/Neglect Assessment (Assessment to be complete while patient is alone) Physical Abuse: Denies Verbal Abuse: Denies Sexual Abuse: Denies Exploitation of patient/patient's resources: Denies Self-Neglect: Denies  Prior Inpatient Therapy:    Prior Outpatient Therapy:    Additional Information:      Objective: Blood pressure 155/83, pulse 103, temperature  17 F (36.7 C), temperature source Oral, resp. rate 18, height _0  (1.854 m), weight 93.441 kg (206 lb), SpO2 99 %.Body mass index is 27.18 kg/(m^2). Results for orders placed or performed during the hospital encounter of 05/20/14 (from the past 72 hour(s))  Ethanol     Status: None   Collection Time: 05/20/14 11:09 AM  Result Value Ref Range   Alcohol, Ethyl (B) <5 0 - 9 mg/dL    Comment:        LOWEST DETECTABLE LIMIT FOR SERUM ALCOHOL IS 11 mg/dL FOR MEDICAL PURPOSES ONLY   Basic  metabolic panel     Status: Abnormal   Collection Time: 05/20/14 11:14 AM  Result Value Ref Range   Sodium 139 135 - 145 mmol/L    Comment: Please note change in reference range.   Potassium 4.6 3.5 - 5.1 mmol/L    Comment: Please note change in reference range.   Chloride 106 96 - 112 mEq/L   CO2 26 19 - 32 mmol/L   Glucose, Bld 107 (H) 70 - 99 mg/dL   BUN 20 6 - 23 mg/dL   Creatinine, Ser 1.46 (H) 0.50 - 1.35 mg/dL   Calcium 9.8 8.4 - 10.5 mg/dL   GFR calc non Af Amer 52 (L) >90 mL/min   GFR calc Af Amer 60 (L) >90 mL/min    Comment: (NOTE) The eGFR has been calculated using the CKD EPI equation. This calculation has not been validated in all clinical situations. eGFR's persistently <90 mL/min signify possible Chronic Kidney Disease.    Anion gap 7 5 - 15  CBC with Differential     Status: None   Collection Time: 05/20/14 11:14 AM  Result Value Ref Range   WBC 8.1 4.0 - 10.5 K/uL   RBC 4.69 4.22 - 5.81 MIL/uL   Hemoglobin 15.5 13.0 - 17.0 g/dL   HCT 46.9 39.0 - 52.0 %   MCV 100.0 78.0 - 100.0 fL   MCH 33.0 26.0 - 34.0 pg   MCHC 33.0 30.0 - 36.0 g/dL   RDW 12.9 11.5 - 15.5 %   Platelets 165 150 - 400 K/uL   Neutrophils Relative % 53 43 - 77 %   Neutro Abs 4.3 1.7 - 7.7 K/uL   Lymphocytes Relative 35 12 - 46 %   Lymphs Abs 2.8 0.7 - 4.0 K/uL   Monocytes Relative 10 3 - 12 %   Monocytes Absolute 0.8 0.1 - 1.0 K/uL   Eosinophils Relative 2 0 - 5 %   Eosinophils Absolute 0.2 0.0 - 0.7 K/uL   Basophils Relative 0 0 - 1 %   Basophils Absolute 0.0 0.0 - 0.1 K/uL  Urinalysis, Routine w reflex microscopic     Status: Abnormal   Collection Time: 05/20/14 12:19 PM  Result Value Ref Range   Color, Urine YELLOW YELLOW   APPearance CLEAR CLEAR   Specific Gravity, Urine 1.016 1.005 - 1.030   pH 5.0 5.0 - 8.0   Glucose, UA NEGATIVE NEGATIVE mg/dL   Hgb urine dipstick TRACE (A) NEGATIVE   Bilirubin Urine NEGATIVE NEGATIVE   Ketones, ur NEGATIVE NEGATIVE mg/dL   Protein, ur  NEGATIVE NEGATIVE mg/dL   Urobilinogen, UA 1.0 0.0 - 1.0 mg/dL   Nitrite NEGATIVE NEGATIVE   Leukocytes, UA NEGATIVE NEGATIVE  Urine rapid drug screen (hosp performed)     Status: Abnormal   Collection Time: 05/20/14 12:19 PM  Result Value Ref Range   Opiates NONE DETECTED NONE DETECTED  Cocaine POSITIVE (A) NONE DETECTED   Benzodiazepines NONE DETECTED NONE DETECTED   Amphetamines NONE DETECTED NONE DETECTED   Tetrahydrocannabinol POSITIVE (A) NONE DETECTED   Barbiturates NONE DETECTED NONE DETECTED    Comment:        DRUG SCREEN FOR MEDICAL PURPOSES ONLY.  IF CONFIRMATION IS NEEDED FOR ANY PURPOSE, NOTIFY LAB WITHIN 5 DAYS.        LOWEST DETECTABLE LIMITS FOR URINE DRUG SCREEN Drug Class       Cutoff (ng/mL) Amphetamine      1000 Barbiturate      200 Benzodiazepine   521 Tricyclics       747 Opiates          300 Cocaine          300 THC              50   Urine microscopic-add on     Status: None   Collection Time: 05/20/14 12:19 PM  Result Value Ref Range   RBC / HPF 0-2 <3 RBC/hpf   Labs are reviewed and are pertinent for Cocaine, THC, BAL negative.  No current facility-administered medications for this encounter.    Psychiatric Specialty Exam:     Blood pressure 155/83, pulse 103, temperature 98 F (36.7 C), temperature source Oral, resp. rate 18, height _0  (1.854 m), weight 93.441 kg (206 lb), SpO2 99 %.Body mass index is 27.18 kg/(m^2).  General Appearance: Casual and Fairly Groomed  Engineer, water::  Good  Speech:  Clear and Coherent and Normal Rate  Volume:  Normal  Mood:  Euthymic  Affect:  Appropriate and Congruent  Thought Process:  Coherent and Goal Directed  Orientation:  Full (Time, Place, and Person)  Thought Content:  WDL  Suicidal Thoughts:  No  Homicidal Thoughts:  No  Memory:  Immediate;   Fair Recent;   Fair Remote;   Fair  Judgement:  Good  Insight:  Good  Psychomotor Activity:  Normal  Concentration:  Good  Recall:  Good  Fund of  Knowledge:Good  Language: Good  Akathisia:  No  Handed:    AIMS (if indicated):     Assets:  Communication Skills Desire for Improvement Resilience Social Support  Sleep:      Musculoskeletal: Strength & Muscle Tone: within normal limits  Gait & Station: normal Patient leans: N/A  Treatment Plan Summary: Spend the night in the [facility] for safety and stabilization.  Benjamine Mola, FNP-BC 05/20/2014 2:50 PM

## 2014-06-25 ENCOUNTER — Ambulatory Visit (INDEPENDENT_AMBULATORY_CARE_PROVIDER_SITE_OTHER): Payer: Medicaid Other | Admitting: Internal Medicine

## 2014-06-25 ENCOUNTER — Encounter: Payer: Self-pay | Admitting: Internal Medicine

## 2014-06-25 VITALS — BP 160/105 | HR 90 | Temp 98.0°F | Ht 72.0 in | Wt 203.0 lb

## 2014-06-25 DIAGNOSIS — Z23 Encounter for immunization: Secondary | ICD-10-CM | POA: Diagnosis not present

## 2014-06-25 DIAGNOSIS — B182 Chronic viral hepatitis C: Secondary | ICD-10-CM

## 2014-06-25 NOTE — Addendum Note (Signed)
Addended by: Andree CossHOWELL, Lenni Reckner M on: 06/25/2014 11:53 AM   Modules accepted: Orders

## 2014-06-25 NOTE — Progress Notes (Signed)
   Subjective:    Patient ID: [name] [name], male    DOB: 25-Sep-1955, 59 y.o.   MRN: [id number]  HPI Here for his follow up for Hep C genotype 1a, fibrosis F1/2 on [place] and has now completed 12 weeks on 11/7.  No issues at all, no fatigue, no headache.  Pleased to have finished.  RVR and end of treatment vl is undetectable.     Review of Systems  Constitutional: Negative for fatigue and unexpected weight change.  Gastrointestinal: Negative for diarrhea.  Skin: Negative for rash.  Neurological: Negative for dizziness and headaches.       Objective:   Physical Exam  Constitutional: He appears well-developed and well-nourished. No distress.  HENT:  Mouth/Throat: No oropharyngeal exudate.  Eyes: No scleral icterus.  Cardiovascular: Normal rate, regular rhythm and normal heart sounds.   No murmur heard. Pulmonary/Chest: Effort normal and breath sounds normal. No respiratory distress.  Skin: No rash noted.          Assessment & Plan:

## 2014-06-25 NOTE — Assessment & Plan Note (Signed)
We'll check last viral load today and if remains undetectable will be considered cured. No indication for HCC screening therefore no follow-up will be arranged. He can return on a when necessary basis. He finishes hep A #2 today. Again advised in not drinking alcohol. Patient pleased with the care and results. He will be called with final viral load.

## 2014-06-26 LAB — HEPATITIS C RNA QUANTITATIVE: HCV Quantitative: NOT DETECTED IU/mL (ref <15)

## 2014-06-27 ENCOUNTER — Telehealth: Payer: Self-pay | Admitting: *Deleted

## 2014-06-27 NOTE — Telephone Encounter (Signed)
-----   Message from Gardiner Barefootobert W Comer, MD sent at 06/27/2014  8:38 AM EST ----- Please let him know his final HCV viral load is negative and he is considered cured.  No follow up needed.  thanks

## 2014-06-27 NOTE — Telephone Encounter (Signed)
Per Dr Luciana Axeomer note called the patient and had to leave a message for him to call the office back for his results.

## 2014-09-16 ENCOUNTER — Other Ambulatory Visit: Payer: Self-pay | Admitting: Internal Medicine

## 2015-08-21 ENCOUNTER — Emergency Department (HOSPITAL_COMMUNITY)
Admission: EM | Admit: 2015-08-21 | Discharge: 2015-08-21 | Disposition: A | Discharge status: Home / Self Care | Payer: Medicaid Other | Attending: Emergency Medicine | Admitting: Emergency Medicine

## 2015-08-21 ENCOUNTER — Encounter (HOSPITAL_COMMUNITY): Payer: Self-pay | Admitting: Emergency Medicine

## 2015-08-21 DIAGNOSIS — M545 Low back pain: Secondary | ICD-10-CM | POA: Insufficient documentation

## 2015-08-21 DIAGNOSIS — M25552 Pain in left hip: Secondary | ICD-10-CM | POA: Diagnosis present

## 2015-08-21 DIAGNOSIS — I10 Essential (primary) hypertension: Secondary | ICD-10-CM | POA: Diagnosis not present

## 2015-08-21 DIAGNOSIS — M1990 Unspecified osteoarthritis, unspecified site: Secondary | ICD-10-CM | POA: Diagnosis not present

## 2015-08-21 DIAGNOSIS — F17210 Nicotine dependence, cigarettes, uncomplicated: Secondary | ICD-10-CM | POA: Diagnosis not present

## 2015-08-21 DIAGNOSIS — Z79899 Other long term (current) drug therapy: Secondary | ICD-10-CM | POA: Diagnosis not present

## 2015-08-21 MED ORDER — KETOROLAC TROMETHAMINE 60 MG/2ML IM SOLN
30.0000 mg | Freq: Once | INTRAMUSCULAR | Status: AC
  Administered 2015-08-21: 30 mg via INTRAMUSCULAR
  Filled 2015-08-21: qty 2

## 2015-08-21 MED ORDER — CYCLOBENZAPRINE HCL 10 MG PO TABS: 10.0000 mg | ORAL_TABLET | Freq: Two times a day (BID) | ORAL | Status: DC | PRN

## 2015-08-21 MED ORDER — NAPROXEN 500 MG PO TABS: 500.0000 mg | ORAL_TABLET | Freq: Two times a day (BID) | ORAL | Status: DC

## 2015-08-21 NOTE — ED Provider Notes (Signed)
CSN: [id number]     Arrival date & time 08/21/15  [id number] History  By signing my name below, I, [name] [name], attest that this documentation has been prepared under the direction and in the presence of Melburn HakeNicole Nadeau, [place]. Electronically Signed: Ronney LionSuzanne [name], ED Scribe. 08/21/2015. 11:40 AM.    Chief Complaint  Patient presents with  . Hip Pain   The history is provided by the patient. No language interpreter was used.    HPI Comments: Barton DuboisWalter [name] is a 60 y.o. male with a history of arthritis and lumbar disc displacement, who presents to the Emergency Department complaining of constant, 10/10, tight left hip/lower back pain after lifting a tire from a trunk 1 week ago. He reports the pain immediately onset after pulling the tire. Standing, movement, ambulation, and palpation all exacerbate his pain. He also notes tingling beginning in his left lower back radiating down his entire LLE that was present before his injury but which has been worse since. Patient reports a history of similar pain on his right hip due to a pinched nerve and notes he had back surgery 1 year ago. Patient states he has tried Tylenol, ibuprofen, topical analgesic patches, ice and heat; all with no relief. He denies any recent falls or injuries. He also denies a history of cancer, IVDA, or recent visits to a chiropractor or acupuncturist. He denies fever, abdominal pain, nausea, vomiting, diarrhea, bowel or bladder incontinence, saddle anesthesia, difficulty urinating, or extremity weakness.   Past Medical History  Diagnosis Date  . Hypertension     Takes medications daily  . Arthritis     right hip  . Disc displacement, lumbar    Past Surgical History  Procedure Laterality Date  . Back surgery  1980s    x2:2013one back surgery  . Colonoscopy with propofol N/A 09/18/2013    Procedure: COLONOSCOPY WITH PROPOFOL;  Surgeon: Charolett BumpersMartin K Johnson, MD;  Location: [facility];  Service: Endoscopy;  Laterality: N/A;   Family History   Problem Relation Age of Onset  . Hypertension Mother    Social History  Substance Use Topics  . Smoking status: Current Every Day Smoker -- 0.30 packs/day for 35 years    Types: Cigarettes  . Smokeless tobacco: Never Used  . Alcohol Use: 0.0 oz/week    0 Standard drinks or equivalent per week     Comment: occasionally    Review of Systems  Constitutional: Negative for fever.  Gastrointestinal: Negative for nausea, vomiting and abdominal pain.       Negative for bowel incontinence.    Genitourinary: Negative for difficulty urinating.       Negative for bladder incontinence.    Musculoskeletal: Positive for arthralgias.  Neurological: Negative for weakness.       Positive for tingling.   Allergies  Hctz  Home Medications   Prior to Admission medications   Medication Sig Start Date End Date Taking? Authorizing Provider  amLODipine (NORVASC) 10 MG tablet TAKE 1 TABLET BY MOUTH DAILY. 09/28/14   Ambrose FinlandValerie A Keck, NP  cyclobenzaprine (FLEXERIL) 10 MG tablet Take 1 tablet (10 mg total) by mouth 2 (two) times daily as needed for muscle spasms. 08/21/15   Barrett HenleNicole Elizabeth Nadeau, PA-C  naproxen (NAPROSYN) 500 MG tablet Take 1 tablet (500 mg total) by mouth 2 (two) times daily. 08/21/15   Satira SarkNicole Elizabeth Nadeau, PA-C   BP 159/98 mmHg  Pulse 67  Temp(Src) 97.6 F (36.4 C) (Oral)  Resp 16  Ht 6\' 1"  (1.854 m)  Wt 88.451 kg  BMI 25.73 kg/m2  SpO2 100% Physical Exam  Constitutional: He is oriented to person, place, and time. He appears well-developed and well-nourished.  HENT:  Head: Normocephalic and atraumatic.  Eyes: Conjunctivae and EOM are normal. Right eye exhibits no discharge. Left eye exhibits no discharge. No scleral icterus.  Neck: Normal range of motion. Neck supple.  Cardiovascular: Normal rate and regular rhythm.   Pulmonary/Chest: Effort normal and breath sounds normal.  Abdominal: Soft. He exhibits no distension. There is no tenderness.  Musculoskeletal: Normal  range of motion. He exhibits tenderness.  No midline C, T, or L tenderness. TTP over left lumbar paraspinal muscles, extending to left lateral hip. Full range of motion of neck and back. Full range of motion of bilateral upper and lower extremities, with 5/5 strength. Sensation intact. 2+ radial and PT pulses. Cap refill <2 seconds. Patient able to stand and ambulate without assistance.    Neurological: He is alert and oriented to person, place, and time. He has normal strength and normal reflexes. No sensory deficit. Gait normal.  Nursing note and vitals reviewed.   ED Course  Procedures (including critical care time)  DIAGNOSTIC STUDIES: Oxygen Saturation is 97% on RA, normal by my interpretation.    COORDINATION OF CARE: 11:19 AM - Discussed treatment plan with pt at bedside which includes continuing anti-inflammatory medications and muscle relaxants. Will administer single dosage of Toradol here in the ED given severity of pain. Advised heat 15-20 minutes 3-4 times a day. Pt verbalized understanding and agreed to plan.   MDM   Final diagnoses:  Left low back pain, with sciatica presence unspecified   Patient with left hip/lower back pain s/p lifting a spare tire out of his car 1 week ago.  No neurological deficits and normal neuro exam.  Patient can walk but states is painful.  No midline C/T/L tenderness. No loss of bowel or bladder control.  No concern for cauda equina.  No fever, night sweats, weight loss, h/o cancer, IVDU.  I suspect patient's pain is likely due to muscle strain associated with recent injury and do not feel that any further workup or imaging is warranted at this time. Pain medicine indicated and conservative treatments, including heat, discussed with patient.  Strict return precautions discussed with pt.  Pt verbalized understanding and agreed to plan.    I personally performed the services described in this documentation, which was scribed in my presence. The recorded  information has been reviewed and is accurate.     Satira Sark [place], [place] 08/21/15 1142  Derwood Kaplan, MD 08/22/15 [phone]

## 2015-08-21 NOTE — Discharge Instructions (Signed)
Take your medications as prescribed. I recommend continuing to apply ice to affected area for 15-20 minutes 2-4 times daily for the next 2 days, he may then begin using heat as needed for pain relief. I recommend refraining from doing any heavy lifting, repetitive movements or exacerbating symptoms for the next few days. Follow-up with your primary care provider in 3-5 days as needed if her symptoms have not improved. Please return to the Emergency Department if symptoms worsen or new onset of fever, numbness, tingling, groin numbness, loss of bowel or bladder, weakness.

## 2015-08-21 NOTE — ED Notes (Signed)
Onset of pain left hip while lifting a tire from trunk  X 1 week ago

## 2015-08-21 NOTE — ED Notes (Signed)
Declined W/C at D/C and was escorted to lobby by RN. 

## 2015-09-26 ENCOUNTER — Ambulatory Visit (INDEPENDENT_AMBULATORY_CARE_PROVIDER_SITE_OTHER): Payer: Medicaid Other | Admitting: Family Medicine

## 2015-09-26 VITALS — BP 155/97 | Ht 73.0 in | Wt 195.0 lb

## 2015-09-26 DIAGNOSIS — M5440 Lumbago with sciatica, unspecified side: Secondary | ICD-10-CM | POA: Diagnosis present

## 2015-09-26 NOTE — Patient Instructions (Signed)
[facility] [address], [place], [place] [address] Phone: [phone]  Dr Danielle DessElsner [address] [place] [address] [phone]

## 2015-09-26 NOTE — Progress Notes (Signed)
   Subjective:    Patient ID: [name] [name], male    DOB: Feb 28, 1956, 60 y.o.   MRN: [id number]  HPI  CC: left back/hip pain  [name] [name] is a 59-y.o. male who presents with complaints of progressively worsening left back/hip pain.  He has a history of spinal stenosis s/p extensive spine history and was previously experiencing pain and numbness/tingling in his right low back and hip; his last spine surgery was in 2013 with Dr. Danielle DessElsner.  Today, he reports left-sided back and hip pain that has been worsening over the past month.  He describes the pain as "burning" and rates it as a constant "8 to 10" out of 10.  Associated symptoms include numbness/tingling from the hip to his lower thigh and perceived weakness of his left leg.  He is currently ambulating with a cane and is only able to walk for 1/2 a block prior to needing to stop and rest.  His symptoms improve with bending forward and resting.  Pain is not improved with use of OTC pain medications.  He is currently wearing a low back brace to try to help his symptoms.  Past medical history significant for HTN, alcohol dependence, Hepatitis C, and tobacco dependence. Past surgical history significant for multiple spinal surgeries. Medications reviewed. Allergies reviewed.   Review of Systems As noted in HPI.    Objective:   Physical Exam  Well-nourished, well-developed.  No acute distress.  Alert and oriented x3.  Vital signs stable.  Left hip: No tenderness to palpation.  Normal seated internal and external rotation with mild discomfort.  Quadriceps and hamstring strength 5/5 bilaterally.  Patellar tendon and Achilles tendon reflexes 2+ bilaterally.  Light touch sensation intact bilaterally.  Left lower extremity otherwise neurovascularly intact. Lumbar spine: Limited lumbar spine flexion and extension.  Mild tenderness to palpation over left lumbar paraspinal muscles.  Patient unable to tolerate seated straight leg testing due to pain. Gait:  Antalgic gait with cane.     Assessment & Plan:   Left-sided lumbar spine pain with radiculopathy Spinal stenosis s/p L3-S1 decompression and fusion  Given the similarity of patient's symptoms to his previous symptoms, as well as his significant surgical history, we will refer him back to Dr. Danielle DessElsner for follow-up and further evaluation of his current symptoms.  He is requesting medication of pain, but he is advised to [facility] for [facility] medications.  Jenna E. Anola GurneyWalls, M.D. [facility] Attending Note: I have seen and examined this patient. I have discussed this patient with the resident and reviewed the assessment and plan as documented above. I agree with the resident's findings and plan.

## 2015-10-03 NOTE — Addendum Note (Signed)
Addended by: Annita BrodMOORE, Akaash Vandewater C on: 10/03/2015 02:12 PM   Modules accepted: Orders

## 2015-10-27 ENCOUNTER — Emergency Department (HOSPITAL_COMMUNITY): Payer: Medicaid Other

## 2015-10-27 ENCOUNTER — Emergency Department (HOSPITAL_COMMUNITY)
Admission: EM | Admit: 2015-10-27 | Discharge: 2015-10-27 | Disposition: A | Discharge status: Home / Self Care | Payer: Medicaid Other | Attending: Emergency Medicine | Admitting: Emergency Medicine

## 2015-10-27 ENCOUNTER — Ambulatory Visit (HOSPITAL_BASED_OUTPATIENT_CLINIC_OR_DEPARTMENT_OTHER)
Admit: 2015-10-27 | Discharge: 2015-10-27 | Disposition: A | Discharge status: Home / Self Care | Payer: Medicaid Other | Attending: Emergency Medicine | Admitting: Emergency Medicine

## 2015-10-27 ENCOUNTER — Encounter (HOSPITAL_COMMUNITY): Payer: Self-pay | Admitting: Emergency Medicine

## 2015-10-27 ENCOUNTER — Other Ambulatory Visit: Payer: Self-pay

## 2015-10-27 DIAGNOSIS — F17210 Nicotine dependence, cigarettes, uncomplicated: Secondary | ICD-10-CM | POA: Insufficient documentation

## 2015-10-27 DIAGNOSIS — R0789 Other chest pain: Secondary | ICD-10-CM | POA: Diagnosis not present

## 2015-10-27 DIAGNOSIS — R609 Edema, unspecified: Secondary | ICD-10-CM

## 2015-10-27 DIAGNOSIS — I10 Essential (primary) hypertension: Secondary | ICD-10-CM | POA: Insufficient documentation

## 2015-10-27 DIAGNOSIS — R0602 Shortness of breath: Secondary | ICD-10-CM

## 2015-10-27 DIAGNOSIS — M10031 Idiopathic gout, right wrist: Secondary | ICD-10-CM | POA: Diagnosis not present

## 2015-10-27 DIAGNOSIS — M7989 Other specified soft tissue disorders: Secondary | ICD-10-CM | POA: Insufficient documentation

## 2015-10-27 DIAGNOSIS — M109 Gout, unspecified: Secondary | ICD-10-CM

## 2015-10-27 LAB — D-DIMER, QUANTITATIVE (NOT AT ARMC): D DIMER QUANT: 0.74 ug{FEU}/mL — AB (ref 0.00–0.50)

## 2015-10-27 LAB — CBC
HCT: 43.9 % (ref 39.0–52.0)
Hemoglobin: 14.3 g/dL (ref 13.0–17.0)
MCH: 31.8 pg (ref 26.0–34.0)
MCHC: 32.6 g/dL (ref 30.0–36.0)
MCV: 97.8 fL (ref 78.0–100.0)
PLATELETS: 141 10*3/uL — AB (ref 150–400)
RBC: 4.49 MIL/uL (ref 4.22–5.81)
RDW: 12.2 % (ref 11.5–15.5)
WBC: 9.9 10*3/uL (ref 4.0–10.5)

## 2015-10-27 LAB — BASIC METABOLIC PANEL
Anion gap: 8 (ref 5–15)
BUN: 10 mg/dL (ref 6–20)
CHLORIDE: 101 mmol/L (ref 101–111)
CO2: 25 mmol/L (ref 22–32)
CREATININE: 1.07 mg/dL (ref 0.61–1.24)
Calcium: 9.6 mg/dL (ref 8.9–10.3)
GFR calc Af Amer: >60 mL/min (ref >60)
GFR calc non Af Amer: >60 mL/min (ref >60)
Glucose, Bld: 124 mg/dL — ABNORMAL HIGH (ref 65–99)
Potassium: 3.5 mmol/L (ref 3.5–5.1)
Sodium: 134 mmol/L — ABNORMAL LOW (ref 135–145)

## 2015-10-27 LAB — TROPONIN I: Troponin I: <0.03 ng/mL (ref <0.031)

## 2015-10-27 LAB — I-STAT TROPONIN, ED: TROPONIN I, POC: 0 ng/mL (ref 0.00–0.08)

## 2015-10-27 MED ORDER — IOPAMIDOL (ISOVUE-370) INJECTION 76%
INTRAVENOUS | Status: AC
  Administered 2015-10-27: 80 mL
  Filled 2015-10-27: qty 100

## 2015-10-27 MED ORDER — OXYCODONE-ACETAMINOPHEN 5-325 MG PO TABS
1.0000 | ORAL_TABLET | Freq: Once | ORAL | Status: AC
  Administered 2015-10-27: 1 via ORAL
  Filled 2015-10-27: qty 1

## 2015-10-27 MED ORDER — OXYCODONE-ACETAMINOPHEN 5-325 MG PO TABS: 1.0000 | ORAL_TABLET | Freq: Four times a day (QID) | ORAL | Status: DC | PRN

## 2015-10-27 MED ORDER — INDOMETHACIN 25 MG PO CAPS
50.0000 mg | ORAL_CAPSULE | Freq: Once | ORAL | Status: AC
  Administered 2015-10-27: 50 mg via ORAL
  Filled 2015-10-27: qty 2

## 2015-10-27 NOTE — ED Provider Notes (Signed)
CSN: [id number]     Arrival date & time 10/27/15  1308 History   First MD Initiated Contact with Patient 10/27/15 [phone]     Chief Complaint  Patient presents with  . Chest Pain  . Arm Pain     (Consider location/radiation/quality/duration/timing/severity/associated sxs/prior Treatment) HPI [name] [name] is a 60 year old male with a past medical history of alcohol abuse, cocaine and THC abuse, hypertension, left back and hip pain status post extensive spinal surgeries presenting with chest pain and right arm swelling.  The patient notes that on Friday while taking a 4 hour car ride to [place], [place] he started having a sharp right-sided chest pain that gradually intensified. The chest pain was worsened by deep inspiration and movement. There was no radiation of chest pain. He developed SOB on Saturday. On Sunday morning, he noted that his right arm appeared to be more swollen than his left. He also noted it to be erythematous around the wrist. The swelling continued to worsen throughout Sunday.  His symptoms have continued to worsen from their onset. He endorses continued shortness of breath.  Denies diaphoresis, cough, dizziness, abdominal pain, leg pain, fever, chills, hemoptysis. He denies any significant cardiac history. No recent surgeries, h/o cancer, or h/o DVT/PE. No history of trauma to that arm. No abrasions noted recently.  He has not taken anything for the pain.   Currently his chest pain is a 7/10, however worsens to a 10/10 with leaning forward or deep inspiration. The patient admits to drinking heavily over the weekend. He smokes THC regularly. He notes it has been months since he's done cocaine.    Past Medical History  Diagnosis Date  . Hypertension     Takes medications daily  . Arthritis     right hip  . Disc displacement, lumbar    Past Surgical History  Procedure Laterality Date  . Back surgery  1980s    x2:2013one back surgery  . Colonoscopy with propofol  N/A 09/18/2013    Procedure: COLONOSCOPY WITH PROPOFOL;  Surgeon: Charolett Bumpers, MD;  Location: [facility];  Service: Endoscopy;  Laterality: N/A;   Family History  Problem Relation Age of Onset  . Hypertension Mother    Social History  Substance Use Topics  . Smoking status: Current Every Day Smoker -- 0.30 packs/day for 35 years    Types: Cigarettes  . Smokeless tobacco: Never Used  . Alcohol Use: 0.0 oz/week    0 Standard drinks or equivalent per week     Comment: occasionally    Review of Systems  Constitutional: Negative for fever, chills, diaphoresis, activity change, appetite change and fatigue.  HENT: Negative.   Eyes: Negative.   Respiratory: Positive for chest tightness and shortness of breath. Negative for apnea, cough, choking, wheezing and stridor.   Cardiovascular: Positive for chest pain. Negative for palpitations and leg swelling.  Gastrointestinal: Negative for nausea, vomiting, abdominal pain, diarrhea and constipation.  Endocrine: Negative.   Genitourinary: Negative.   Musculoskeletal: Positive for myalgias, joint swelling and arthralgias. Negative for back pain, gait problem, neck pain and neck stiffness.  Skin: Positive for color change.  Allergic/Immunologic: Negative.   Neurological: Negative.   Hematological: Negative.   Psychiatric/Behavioral: The patient is not nervous/anxious.       Allergies  Hctz  Home Medications   Prior to Admission medications   Medication Sig Start Date End Date Taking? Authorizing Provider  ibuprofen (ADVIL,MOTRIN) 200 MG tablet Take 400 mg by mouth every 6 (six) hours  as needed (pain).   Yes Historical Provider, MD  amLODipine (NORVASC) 10 MG tablet TAKE 1 TABLET BY MOUTH DAILY. Patient not taking: Reported on 10/27/2015 09/28/14   Ambrose Finland, NP  oxyCODONE-acetaminophen (ROXICET) 5-325 MG tablet Take 1 tablet by mouth every 6 (six) hours as needed for severe pain. 10/27/15   Joanna Puff, MD   BP 181/101  mmHg  Pulse 66  Temp(Src) 98.5 F (36.9 C) (Oral)  Resp 16  Ht 6\' 1"  (1.854 m)  Wt 83.915 kg  BMI 24.41 kg/m2  SpO2 94% Physical Exam  Constitutional: He is oriented to person, place, and time. He appears well-developed and well-nourished. No distress.  HENT:  Head: Normocephalic.  Nose: Nose normal.  Mouth/Throat: Oropharynx is clear and moist. No oropharyngeal exudate.  Eyes: Conjunctivae are normal. Right eye exhibits no discharge. Left eye exhibits no discharge. No scleral icterus.  Neck: Normal range of motion. Neck supple. No JVD present.  Cardiovascular: Normal rate, regular rhythm and intact distal pulses.  Exam reveals no gallop and no friction rub.   No murmur heard. Pulmonary/Chest: Effort normal and breath sounds normal. No respiratory distress. He has no wheezes. He has no rales. He exhibits no tenderness.  Abdominal: Soft. Bowel sounds are normal. He exhibits no distension. There is no tenderness. There is no rebound and no guarding.  Musculoskeletal:  Right wrist visually looks larger than the left. Erythema about the dorsal aspect of his wrist with mild warmth. 1cm discrepancy in size in the upper forearm, right greater than left. 2cm discrepancy in size at the wrist, right greater than left. Pain with active and passive movement of the right wrist.   Lymphadenopathy:    He has no cervical adenopathy.  Neurological: He is alert and oriented to person, place, and time. He exhibits normal muscle tone. Coordination normal.  Skin: Skin is warm. He is not diaphoretic. No erythema.  Psychiatric: He has a normal mood and affect.    ED Course  Procedures (including critical care time)  835: Given symptoms and physical exam, concerned for DVT of the R UE. D-dimer ordered. Patient has taken a long trip recently. No h/o caner, previous clots, recent surgery, hemoptysis. Patient is not hypoxic or tachycardic. RUE ultrasound ordered.   921: D-dimer elevated to 0.74   Labs  Review Labs Reviewed  BASIC METABOLIC PANEL - Abnormal; Notable for the following:    Sodium 134 (*)    Glucose, Bld 124 (*)    All other components within normal limits  CBC - Abnormal; Notable for the following:    Platelets 141 (*)    All other components within normal limits  D-DIMER, QUANTITATIVE (NOT AT [name]) - Abnormal; Notable for the following:    D-Dimer, Quant 0.74 (*)    All other components within normal limits  TROPONIN I  Rosezena Sensor, ED    Imaging Review Dg Chest 2 View  10/27/2015  CLINICAL DATA:  Right upper chest pain 4 days EXAM: CHEST  2 VIEW COMPARISON:  None. FINDINGS: The heart size and mediastinal contours are within normal limits. Both lungs are clear. The visualized skeletal structures are unremarkable. IMPRESSION: No active cardiopulmonary disease. Electronically Signed   By: Elige Ko   On: 10/27/2015 09:29   Ct Angio Chest Pe W/cm &/or Wo Cm  10/27/2015  CLINICAL DATA:  60 year old male with recent trip to [facility], new right upper extremity swelling, right side chest pain and shortness of breath. Initial encounter.  EXAM: CT ANGIOGRAPHY CHEST WITH CONTRAST TECHNIQUE: Multidetector CT imaging of the chest was performed using the standard protocol during bolus administration of intravenous contrast. Multiplanar CT image reconstructions and MIPs were obtained to evaluate the vascular anatomy. CONTRAST:  80 mL Isovue 370 COMPARISON:  Chest radiographs 0855 hours today and earlier. Lumbar CT myelogram 11/19/2011. FINDINGS: Good contrast bolus timing in the pulmonary arterial tree. Mild to moderate lung base respiratory motion. No convincing bilateral pulmonary artery filling defect. Major airways are patent. Mild dependent opacity mean most resembling atelectasis in both lungs. No pleural effusion. No other abnormal pulmonary opacity. No pericardial effusion. Evidence of mild calcified atherosclerosis of the aorta and coronary arteries. No  thoracic lymphadenopathy. Negative thoracic inlet. The left upper extremity was injected. No noncontrast abnormality of the visualized right subclavian and axillary vasculature. Negative visualized liver, gallbladder, spleen, pancreas, adrenal glands, kidneys, and bowel in the upper abdomen. No acute osseous abnormality identified. Review of the MIP images confirms the above findings. IMPRESSION: 1. Mildly degraded by motion with no definite acute pulmonary embolus. 2. Mild pulmonary atelectasis. Otherwise no acute findings in the chest. 3. Mild calcified aortic and coronary artery atherosclerosis. Electronically Signed   By: Odessa FlemingH  Hall M.D.   On: 10/27/2015 12:41   I have personally reviewed and evaluated these images and lab results as part of my medical decision-making.   EKG Interpretation   Date/Time:  Monday October 27 2015 08:26:29 EDT Ventricular Rate:  69 PR Interval:    QRS Duration: 78 QT Interval:  392 QTC Calculation: 420 R Axis:   33 Text Interpretation:  Sinus rhythm Probable left atrial enlargement  Abnormal R-wave progression, early transition Probable left ventricular  hypertrophy No significant change was found Confirmed by CAMPOS  MD, Caryn BeeKEVIN  ([id number]) on 10/27/2015 8:50:29 AM      MDM   Final diagnoses:  Swelling  SOB (shortness of breath)  Acute gout of right wrist, unspecified cause    [name] [name] is a 60 year old male with past medical history of alcohol use, cocaine abuse, hypertension presented with gradual onset right-sided chest pain and right upper extremity swelling. Initially, given his consolation of symptoms, was concerned for a DVT in his right upper extremity leading to PE. Dimer mildly elevated at 0.74. Upper extremity ultrasound negative for DVT in the right. CTA of the chest was negative. His EKG was unchanged, and plan acute troponin was negative.  There was no leukocytosis, he is afebrile, and his physical exam finding is not consistent with septic arthritis.  The patient received a dose of indomethacin and Percocet while in the ED. He was placed in a thumb spica immobilization.  He was advised to take Ibuprofen 600mg  q6hrs as needed for pain. He was given a prescription for Percocet every 6 hours as needed for severe pain, quantity 12.  Patient stable for discharge. He is in agreement with the plan. Strict return precautions discussed.     Joanna Puffrystal S Boss Danielsen, MD 10/27/15 1502  Azalia BilisKevin Campos, MD 10/28/15 [phone]

## 2015-10-27 NOTE — Progress Notes (Addendum)
*  Preliminary Results* Right upper extremity venous duplex completed. Right upper extremity is negative for deep and superficial vein thrombosis.  10/27/2015 11:47 AM  Gertie FeyMichelle Donette Mainwaring, RVT, RDCS, RDMS

## 2015-10-27 NOTE — ED Notes (Signed)
Pt c/o right sided chest pain that started Friday-- right arm started swelling with severe pain yesterday-- hurts to take a deep breath.

## 2015-10-27 NOTE — Discharge Instructions (Signed)
Take Ibuprofen 600mg  every 6 hours as needed for pain, this is going to help with the inflammation. I have prescribed Percocet for you to take as needed for severe pain if the Ibuprofen does not help. Take this ever 6 hours as needed. Try to decrease both red meat and alcohol. Continue to wear the thumb/wrist brace, this will help keep it still so that it doesn't hurt as much. We didn't see any issues with your lungs or heart.  Follow up with your PCP.  Gout Gout is an inflammatory arthritis caused by a buildup of uric acid crystals in the joints. Uric acid is a chemical that is normally present in the blood. When the level of uric acid in the blood is too high it can form crystals that deposit in your joints and tissues. This causes joint redness, soreness, and swelling (inflammation). Repeat attacks are common. Over time, uric acid crystals can form into masses (tophi) near a joint, destroying bone and causing disfigurement. Gout is treatable and often preventable. CAUSES  The disease begins with elevated levels of uric acid in the blood. Uric acid is produced by your body when it breaks down a naturally found substance called purines. Certain foods you eat, such as meats and fish, contain high amounts of purines. Causes of an elevated uric acid level include:  Being passed down from parent to child (heredity).  Diseases that cause increased uric acid production (such as obesity, psoriasis, and certain cancers).  Excessive alcohol use.  Diet, especially diets rich in meat and seafood.  Medicines, including certain cancer-fighting medicines (chemotherapy), water pills (diuretics), and aspirin.  Chronic kidney disease. The kidneys are no longer able to remove uric acid well.  Problems with metabolism. Conditions strongly associated with gout include:  Obesity.  High blood pressure.  High cholesterol.  Diabetes. Not everyone with elevated uric acid levels gets gout. It is not  understood why some people get gout and others do not. Surgery, joint injury, and eating too much of certain foods are some of the factors that can lead to gout attacks. SYMPTOMS   An attack of gout comes on quickly. It causes intense pain with redness, swelling, and warmth in a joint.  Fever can occur.  Often, only one joint is involved. Certain joints are more commonly involved:  Base of the big toe.  Knee.  Ankle.  Wrist.  Finger. Without treatment, an attack usually goes away in a few days to weeks. Between attacks, you usually will not have symptoms, which is different from many other forms of arthritis. DIAGNOSIS  Your caregiver will suspect gout based on your symptoms and exam. In some cases, tests may be recommended. The tests may include:  Blood tests.  Urine tests.  X-rays.  Joint fluid exam. This exam requires a needle to remove fluid from the joint (arthrocentesis). Using a microscope, gout is confirmed when uric acid crystals are seen in the joint fluid. TREATMENT  There are two phases to gout treatment: treating the sudden onset (acute) attack and preventing attacks (prophylaxis).  Treatment of an Acute Attack.  Medicines are used. These include anti-inflammatory medicines or steroid medicines.  An injection of steroid medicine into the affected joint is sometimes necessary.  The painful joint is rested. Movement can worsen the arthritis.  You may use warm or cold treatments on painful joints, depending which works best for you.  Treatment to Prevent Attacks.  If you suffer from frequent gout attacks, your caregiver may advise preventive  medicine. These medicines are started after the acute attack subsides. These medicines either help your kidneys eliminate uric acid from your body or decrease your uric acid production. You may need to stay on these medicines for a very long time.  The early phase of treatment with preventive medicine can be associated with  an increase in acute gout attacks. For this reason, during the first few months of treatment, your caregiver may also advise you to take medicines usually used for acute gout treatment. Be sure you understand your caregiver's directions. Your caregiver may make several adjustments to your medicine dose before these medicines are effective.  Discuss dietary treatment with your caregiver or dietitian. Alcohol and drinks high in sugar and fructose and foods such as meat, poultry, and seafood can increase uric acid levels. Your caregiver or dietitian can advise you on drinks and foods that should be limited. HOME CARE INSTRUCTIONS   Do not take aspirin to relieve pain. This raises uric acid levels.  Only take over-the-counter or prescription medicines for pain, discomfort, or fever as directed by your caregiver.  Rest the joint as much as possible. When in bed, keep sheets and blankets off painful areas.  Keep the affected joint raised (elevated).  Apply warm or cold treatments to painful joints. Use of warm or cold treatments depends on which works best for you.  Use crutches if the painful joint is in your leg.  Drink enough fluids to keep your urine clear or pale yellow. This helps your body get rid of uric acid. Limit alcohol, sugary drinks, and fructose drinks.  Follow your dietary instructions. Pay careful attention to the amount of protein you eat. Your daily diet should emphasize fruits, vegetables, whole grains, and fat-free or low-fat milk products. Discuss the use of coffee, vitamin C, and cherries with your caregiver or dietitian. These may be helpful in lowering uric acid levels.  Maintain a healthy body weight. SEEK MEDICAL CARE IF:   You develop diarrhea, vomiting, or any side effects from medicines.  You do not feel better in 24 hours, or you are getting worse. SEEK IMMEDIATE MEDICAL CARE IF:   Your joint becomes suddenly more tender, and you have chills or a fever. MAKE  SURE YOU:   Understand these instructions.  Will watch your condition.  Will get help right away if you are not doing well or get worse.   This information is not intended to replace advice given to you by your health care provider. Make sure you discuss any questions you have with your health care provider.   Document Released: 04/23/2000 Document Revised: 05/17/2014 Document Reviewed: 12/08/2011 Elsevier Interactive Patient Education [employer]

## 2015-10-28 MED FILL — OXYCODONE/APAP 5-325: 5-325 | 3 days supply | Qty: 12 | Fill #0

## 2016-12-09 ENCOUNTER — Emergency Department (HOSPITAL_COMMUNITY)
Admission: EM | Admit: 2016-12-09 | Discharge: 2016-12-09 | Disposition: A | Discharge status: Home / Self Care | Payer: Medicaid Other | Attending: Emergency Medicine | Admitting: Emergency Medicine

## 2016-12-09 ENCOUNTER — Encounter (HOSPITAL_COMMUNITY): Payer: Self-pay

## 2016-12-09 DIAGNOSIS — M5442 Lumbago with sciatica, left side: Secondary | ICD-10-CM | POA: Diagnosis not present

## 2016-12-09 DIAGNOSIS — F17210 Nicotine dependence, cigarettes, uncomplicated: Secondary | ICD-10-CM | POA: Diagnosis not present

## 2016-12-09 DIAGNOSIS — M5441 Lumbago with sciatica, right side: Secondary | ICD-10-CM | POA: Diagnosis not present

## 2016-12-09 DIAGNOSIS — Z79899 Other long term (current) drug therapy: Secondary | ICD-10-CM | POA: Diagnosis not present

## 2016-12-09 DIAGNOSIS — M545 Low back pain: Secondary | ICD-10-CM | POA: Diagnosis present

## 2016-12-09 DIAGNOSIS — I10 Essential (primary) hypertension: Secondary | ICD-10-CM | POA: Insufficient documentation

## 2016-12-09 DIAGNOSIS — G8929 Other chronic pain: Secondary | ICD-10-CM | POA: Diagnosis not present

## 2016-12-09 MED ORDER — KETOROLAC TROMETHAMINE 15 MG/ML IJ SOLN
30.0000 mg | Freq: Once | INTRAMUSCULAR | Status: AC
  Administered 2016-12-09: 30 mg via INTRAMUSCULAR
  Filled 2016-12-09: qty 2

## 2016-12-09 MED ORDER — METHOCARBAMOL 500 MG PO TABS: 500.0000 mg | ORAL_TABLET | Freq: Two times a day (BID) | ORAL | 0 refills | Status: DC

## 2016-12-09 NOTE — Discharge Instructions (Signed)
Please read attached information regarding your condition. Take anti-inflammatories and muscle relaxer as directed. Apply heat to area as tolerated and stretch as tolerated. Return to ED for worsening pain, numbness, weakness, trouble walking, injury or falls, loss of bladder function.

## 2016-12-09 NOTE — ED Provider Notes (Signed)
[facility]-EMERGENCY DEPT Provider Note   CSN: [id number] Arrival date & time: 12/09/16  0919     History   Chief Complaint No chief complaint on file.   HPI [name] [name] is a 61 y.o. male.  HPI Patient, with a past medical history of arthritis and lumbar disc displacement, presents to ED for evaluation of bilateral paraspinal musculature low back pain for the past 2 days. He states that he twisted a certain way and has exacerbation of his chronic back pain. He says pain worse with ambulation though he is still able to walk normally. He reports sharp shooting pain going down both of his legs up to the knee. States his most recent back surgery was 2 years ago. He has tried ibuprofen with mild relief in his symptoms. He denies any falls, injuries, history of cancer, history of IV drug use, fever, bowel incontinence, bladder incontinence, numbness, weakness, urinary symptoms, loss of consciousness.  Past Medical History:  Diagnosis Date  . Arthritis    right hip  . Disc displacement, lumbar   . Hypertension    Takes medications daily    Patient Active Problem List   Diagnosis Date Noted  . Alcohol dependence (HCC) 05/20/2014  . Hypertension 04/30/2011  . Spinal stenosis, lumbar region, with neurogenic claudication 02/01/2011  . Hepatitis C virus infection without hepatic coma 02/01/2011  . Chronic pain 02/01/2011  . HIP PAIN, RIGHT 02/02/2010    Past Surgical History:  Procedure Laterality Date  . BACK SURGERY  1980s   x2:2013one back surgery  . COLONOSCOPY WITH PROPOFOL N/A 09/18/2013   Procedure: COLONOSCOPY WITH PROPOFOL;  Surgeon: Charolett BumpersMartin K Johnson, MD;  Location: [facility];  Service: Endoscopy;  Laterality: N/A;       Home Medications    Prior to Admission medications   Medication Sig Start Date End Date Taking? Authorizing Provider  amLODipine (NORVASC) 10 MG tablet TAKE 1 TABLET BY MOUTH DAILY. Patient not taking: Reported on 10/27/2015 09/28/14   Ambrose FinlandKeck, Valerie A, NP   ibuprofen (ADVIL,MOTRIN) 200 MG tablet Take 400 mg by mouth every 6 (six) hours as needed (pain).    [provider]  methocarbamol (ROBAXIN) 500 MG tablet Take 1 tablet (500 mg total) by mouth 2 (two) times daily. 12/09/16   Sahaj Bona, PA-C  oxyCODONE-acetaminophen (ROXICET) 5-325 MG tablet Take 1 tablet by mouth every 6 (six) hours as needed for severe pain. 10/27/15   Joanna Pufforsey, Crystal S, MD    Family History Family History  Problem Relation Age of Onset  . Hypertension Mother     Social History Social History  Substance Use Topics  . Smoking status: Current Every Day Smoker    Packs/day: 0.30    Years: 35.00    Types: Cigarettes  . Smokeless tobacco: Never Used  . Alcohol use 0.0 oz/week     Comment: occasionally     Allergies   Hctz [hydrochlorothiazide]   Review of Systems Review of Systems  Constitutional: Negative for chills and fever.  Gastrointestinal: Negative for nausea and vomiting.  Genitourinary: Negative for dysuria and urgency.  Musculoskeletal: Positive for back pain. Negative for neck pain and neck stiffness.  Skin: Negative for color change and rash.  Neurological: Negative for dizziness, seizures, weakness, light-headedness and numbness.     Physical Exam Updated Vital Signs BP (!) 186/94 (BP Location: Right Arm)   Pulse 83   Temp 98.1 F (36.7 C) (Oral)   Resp 16   Ht 6\' 1"  (1.854 m)  Wt 102.1 kg (225 lb)   SpO2 95%   BMI 29.69 kg/m   Physical Exam  Constitutional: He appears well-developed and well-nourished. No distress.  HENT:  Head: Normocephalic and atraumatic.  Eyes: Conjunctivae and EOM are normal. No scleral icterus.  Neck: Normal range of motion.  Pulmonary/Chest: Effort normal. No respiratory distress.  Musculoskeletal: Normal range of motion. He exhibits tenderness. He exhibits no edema or deformity.       Arms: Right and left sided paraspinal musculature tenderness in the lumbar region as indicated. No midline  spinal tenderness present in lumbar, thoracic or cervical spine. No step-off palpated. No visible bruising, edema or temperature change noted. No objective signs of numbness present. No saddle anesthesia. 2+ DP pulses bilaterally. Sensation intact to light touch. Strength 5/5 in bilateral lower extremities.   Neurological: He is alert.  Skin: No rash noted. He is not diaphoretic.  Psychiatric: He has a normal mood and affect.  Nursing note and vitals reviewed.    ED Treatments / Results  Labs (all labs ordered are listed, but only abnormal results are displayed) Labs Reviewed - No data to display  EKG  EKG Interpretation None       Radiology No results found.  Procedures Procedures (including critical care time)  Medications Ordered in ED Medications  ketorolac (TORADOL) 15 MG/ML injection 30 mg (not administered)     Initial Impression / Assessment and Plan / ED Course  I have reviewed the triage vital signs and the nursing notes.  Pertinent labs & imaging results that were available during my care of the patient were reviewed by me and considered in my medical decision making (see chart for details).     Patient presents to ED for evaluation of right and left-sided paraspinal musculature back pain on the lumbar region. He denies any recent injury, trauma, falls, urinary incontinence, bowel incontinence, fevers, chills, nausea, vomiting, numbness, weakness or trouble walking. He states that he has chronic back pain that is controlled with no medications but because he twisted a certain way 2 days ago he has had exacerbation of his pain. He is tried Advil with mild relief in his symptoms. On physical exam there is right and left-sided paraspinal musculature as indicated above. There is no midline tenderness, no objective signs of numbness. Strength 5/5 in bilateral lower extremities. He has no other focal findings on musculoskeletal exam. He is able to ambulate normally here  in the ED. He is afebrile with no history of  No saddle anesthesia noted. No history of cancer, IV drug use Low suspicion for cauda equina or other acute spinal cord pain could be due to muscle strain due to the movement that he is describing. No need for further workup or imaging at this time based on symptoms and lack of other findings. We'll give single dose of Toradol here in the ED and discharged with muscle relaxer and instructions on applying heat and stretching area as tolerated. Patient appears stable for discharge at this time. Strict return precautions given for severe or worsening symptoms.   Final Clinical Impressions(s) / ED Diagnoses   Final diagnoses:  Chronic bilateral low back pain with bilateral sciatica    New Prescriptions New Prescriptions   METHOCARBAMOL (ROBAXIN) 500 MG TABLET    Take 1 tablet (500 mg total) by mouth 2 (two) times daily.     Dietrich PatesKhatri, Delando Satter, PA-C 12/09/16 1016    Cathren LaineSteinl, Kevin, MD 12/09/16 [phone]

## 2016-12-09 NOTE — ED Triage Notes (Signed)
Patient complains of chronic back pain that has worsened the past 2 days, denies any new trauma. NAD

## 2017-11-22 ENCOUNTER — Encounter (HOSPITAL_COMMUNITY): Payer: Self-pay

## 2017-11-22 ENCOUNTER — Emergency Department (HOSPITAL_COMMUNITY)
Admission: EM | Admit: 2017-11-22 | Discharge: 2017-11-22 | Disposition: A | Discharge status: Home / Self Care | Payer: Medicaid Other | Attending: Emergency Medicine | Admitting: Emergency Medicine

## 2017-11-22 ENCOUNTER — Emergency Department (HOSPITAL_COMMUNITY): Payer: Medicaid Other

## 2017-11-22 ENCOUNTER — Other Ambulatory Visit: Payer: Self-pay

## 2017-11-22 DIAGNOSIS — G8929 Other chronic pain: Secondary | ICD-10-CM

## 2017-11-22 DIAGNOSIS — M25551 Pain in right hip: Secondary | ICD-10-CM | POA: Insufficient documentation

## 2017-11-22 DIAGNOSIS — R200 Anesthesia of skin: Secondary | ICD-10-CM | POA: Insufficient documentation

## 2017-11-22 DIAGNOSIS — I10 Essential (primary) hypertension: Secondary | ICD-10-CM | POA: Insufficient documentation

## 2017-11-22 DIAGNOSIS — Z79899 Other long term (current) drug therapy: Secondary | ICD-10-CM | POA: Diagnosis not present

## 2017-11-22 DIAGNOSIS — M545 Low back pain: Secondary | ICD-10-CM | POA: Diagnosis not present

## 2017-11-22 DIAGNOSIS — F17210 Nicotine dependence, cigarettes, uncomplicated: Secondary | ICD-10-CM | POA: Insufficient documentation

## 2017-11-22 MED ORDER — KETOROLAC TROMETHAMINE 30 MG/ML IJ SOLN
30.0000 mg | Freq: Once | INTRAMUSCULAR | Status: AC
  Administered 2017-11-22: 30 mg via INTRAMUSCULAR
  Filled 2017-11-22: qty 1

## 2017-11-22 MED ORDER — CYCLOBENZAPRINE HCL 10 MG PO TABS
10.0000 mg | ORAL_TABLET | Freq: Once | ORAL | Status: AC
Start: 2017-11-22 — End: 2017-11-22
  Administered 2017-11-22: 10 mg via ORAL
  Filled 2017-11-22: qty 1

## 2017-11-22 MED ORDER — CYCLOBENZAPRINE HCL 10 MG PO TABS: 10.0000 mg | ORAL_TABLET | Freq: Three times a day (TID) | ORAL | 0 refills | Status: AC | PRN

## 2017-11-22 MED ORDER — IBUPROFEN 800 MG PO TABS: 800.0000 mg | ORAL_TABLET | Freq: Three times a day (TID) | ORAL | 0 refills | Status: AC

## 2017-11-22 NOTE — Discharge Instructions (Signed)
Based on your medical chart, your neurosurgeon was Dr. Danielle DessElsner. I have included his contact info below so you can schedule an appointment with him or another provider in the practice to see about your back.  I have prescribed you a muscle relaxer and anti-inflammatory to help with your pain. Be sure to use your cane if you feel unsteady when you are walking.

## 2017-11-22 NOTE — ED Notes (Signed)
Pt verbalized understanding of discharge instructions and denies any further questions at this time.   

## 2017-11-22 NOTE — ED Triage Notes (Signed)
Pt states he has been having right hip pain for an extended period of time but pain has gotten significantly worse. Pt now states he is unable to walk or bear weight. Pt states burning down his right leg.

## 2017-11-22 NOTE — ED Notes (Signed)
ED Provider at bedside. 

## 2017-11-22 NOTE — ED Provider Notes (Signed)
[facility] EMERGENCY DEPARTMENT Provider Note  CSN: [id number] Arrival date & time: 11/22/17  0840    History   Chief Complaint Chief Complaint  Patient presents with  . Hip Pain    HPI [name] [name] is a 62 y.o. male with a medical history of osteoarthritis, DDD, HTN and spinal stenosis with neurogenic claudication who presented to the ED for right back and hip pain x1 week. Patient describes throbbing lateral hip pain which is worse with movements. Also endorses pain in his low back and down his right leg. Denies recent injuries, traumas or falls prior to this. Denies fever, neck pain, skin lesions or rashes. He states it is painful to walk and this morning that he could barely put weight on his right leg. Denies alcohol or chronic steroid use.  Past Medical History:  Diagnosis Date  . Arthritis    right hip  . Disc displacement, lumbar   . Hypertension    Takes medications daily    Patient Active Problem List   Diagnosis Date Noted  . Alcohol dependence (HCC) 05/20/2014  . Hypertension 04/30/2011  . Spinal stenosis, lumbar region, with neurogenic claudication 02/01/2011  . Hepatitis C virus infection without hepatic coma 02/01/2011  . Chronic pain 02/01/2011  . HIP PAIN, RIGHT 02/02/2010    Past Surgical History:  Procedure Laterality Date  . BACK SURGERY  1980s   x2:2013one back surgery  . COLONOSCOPY WITH PROPOFOL N/A 09/18/2013   Procedure: COLONOSCOPY WITH PROPOFOL;  Surgeon: Charolett Bumpers, MD;  Location: [facility];  Service: Endoscopy;  Laterality: N/A;        Home Medications    Prior to Admission medications   Medication Sig Start Date End Date Taking? Authorizing Provider  amLODipine (NORVASC) 10 MG tablet TAKE 1 TABLET BY MOUTH DAILY. Patient not taking: Reported on 10/27/2015 09/28/14   Ambrose Finland, NP  cyclobenzaprine (FLEXERIL) 10 MG tablet Take 1 tablet (10 mg total) by mouth 3 (three) times daily as needed for up to 10  days for muscle spasms. 11/22/17 12/02/17  Josph Norfleet, Jerrel Ivory I, PA-C  ibuprofen (ADVIL,MOTRIN) 800 MG tablet Take 1 tablet (800 mg total) by mouth 3 (three) times daily for 10 days. 11/22/17 12/02/17  Jalie Eiland, Jerrel Ivory I, PA-C  oxyCODONE-acetaminophen (ROXICET) 5-325 MG tablet Take 1 tablet by mouth every 6 (six) hours as needed for severe pain. 10/27/15   Joanna Puff, MD  hydrochlorothiazide (HYDRODIURIL) 25 MG tablet Take 1 tablet (25 mg total) by mouth daily. 05/17/11 07/26/11  Nestor Ramp, MD    Family History Family History  Problem Relation Age of Onset  . Hypertension Mother     Social History Social History   Tobacco Use  . Smoking status: Current Every Day Smoker    Packs/day: 0.30    Years: 35.00    Pack years: 10.50    Types: Cigarettes  . Smokeless tobacco: Never Used  Substance Use Topics  . Alcohol use: Yes    Alcohol/week: 0.0 oz    Comment: occasionally  . Drug use: Yes    Types: Marijuana, Cocaine    Comment: last used 3-4 days ago     Allergies   Hctz [hydrochlorothiazide]   Review of Systems Review of Systems  Constitutional: Negative for chills and fever.  Genitourinary: Negative.   Musculoskeletal: Positive for arthralgias, back pain and gait problem. Negative for joint swelling, myalgias, neck pain and neck stiffness.  Skin: Negative.   Neurological: Positive for numbness. Negative  for weakness.     Physical Exam Updated Vital Signs BP (!) 178/117 (BP Location: Left Arm)   Pulse 61   Temp 98 F (36.7 C) (Oral)   Resp 18   Ht 6\' 1"  (1.854 m)   Wt 96.2 kg (212 lb)   SpO2 100%   BMI 27.97 kg/m   Physical Exam  Constitutional: He appears well-developed and well-nourished. No distress.  Cardiovascular:  Pulses:      Dorsalis pedis pulses are 2+ on the right side, and 2+ on the left side.       Posterior tibial pulses are 2+ on the right side, and 2+ on the left side.  Musculoskeletal:       Right hip: He exhibits decreased range of  motion and tenderness. He exhibits normal strength, no bony tenderness and no swelling.       Right knee: Normal.       Right ankle: Normal.       Cervical back: Normal.       Thoracic back: Normal.       Lumbar back: He exhibits decreased range of motion, tenderness and spasm. He exhibits no bony tenderness and no swelling.  5/5 strength in lower extremities bilaterally. Right hip is tender to palpation along the lateral and posterior aspects. ROM limited due to pain. Patient refuses to stand up so gait and ambulation could be tested.   Neurological: He has normal strength. No sensory deficit. He exhibits normal muscle tone.  Skin: Skin is warm and intact. Capillary refill takes less than 2 seconds. No bruising and no ecchymosis noted.  Nursing note and vitals reviewed.    ED Treatments / Results  Labs (all labs ordered are listed, but only abnormal results are displayed) Labs Reviewed - No data to display  EKG None  Radiology Dg Pelvis 1-2 Views  Result Date: 11/22/2017 CLINICAL DATA:  Pain right lateral hip. EXAM: PELVIS - 1-2 VIEW COMPARISON:  12/02/2011. FINDINGS: Prior lumbosacral spine fusion. Hardware intact. No acute bony abnormality identified. No evidence of fracture or dislocation. IMPRESSION: 1.  Prior lumbosacral spine fusion.  Hardware intact. 2.  No acute bony abnormality. Electronically Signed   By: Maisie Fus  Register   On: 11/22/2017 09:32    Procedures Procedures (including critical care time)  Medications Ordered in ED Medications  cyclobenzaprine (FLEXERIL) tablet 10 mg (10 mg Oral Given 11/22/17 1020)  ketorolac (TORADOL) 30 MG/ML injection 30 mg (30 mg Intramuscular Given 11/22/17 1020)     Initial Impression / Assessment and Plan / ED Course  Triage vital signs and the nursing notes have been reviewed.  Pertinent labs & imaging results that were available during care of the patient were reviewed and considered in medical decision making (see chart for  details).  Patient presents in no acute distress and is well appearing. Patient denies any trauma, injuries or falls prior to the worsening of pain. While he told the x-ray tech that he could not stand, there is decreased concern for a pelvic/hip fracture because of his history that does not have any risk factors that would lead to that, such as trauma, chronic steroid or alcohol use. However, acute hip pathology needs to be evaluated.   Clinical Course as of Nov 22 1133  Tue Nov 22, 2017  1610 No fractures or dislocations seen on x-ray. Evidence of prior back surgery with hardware.    [GM]  1025 Administered Flexeril and Toradol to provide patient with pain relief. Will re-evaluate patient  after admin.   [GM]  1105 Patient reports significant improvement after Toradol and Flexeril. He is able to ambulate and bear weight on his own. Able to perform active ROM with hip which is still limited, but less painful. Patient able to better localize the pain which starts at ~L9/L10 and goes to his hip.   [GM]    Clinical Course User Index [GM] Chayson Charters, Sharyon MedicusGabrielle I, PA-C   X-ray and physical exam after pain is relieved is reassuring. Given findings, hip pain is likely referred pain from the back. Denies systemic s/s that would suggest an underlying infectious or rheumatologic etiology.  Final Clinical Impressions(s) / ED Diagnoses  1. Right Hip Pain. Likely referred pain from the back. Relief achieved in the ED with muscle relaxer and NSAID. Education provided on OTC and supportive relief for pain as well. Encouraged the patient to follow-up with his neurosurgeon regarding further evaluation and management.  Dispo: Home. After thorough clinical evaluation, this patient is determined to be medically stable and can be safely discharged with the previously mentioned treatment and/or outpatient follow-up/referral(s). At this time, there are no other apparent medical conditions that require further screening,  evaluation or treatment.  Final diagnoses:  Right hip pain  Chronic right-sided low back pain without sciatica    ED Discharge Orders        Ordered    cyclobenzaprine (FLEXERIL) 10 MG tablet  3 times daily PRN     11/22/17 1105    ibuprofen (ADVIL,MOTRIN) 800 MG tablet  3 times daily     11/22/17 [address]1105        Attikus Bartoszek, Green CampGabrielle I, PA-C 11/22/17 1135    Derwood KaplanNanavati, Ankit, MD 11/24/17 [phone]

## 2017-11-22 NOTE — ED Notes (Signed)
Pt states he has not taken his pressure meds today.

## 2018-01-04 ENCOUNTER — Ambulatory Visit (HOSPITAL_COMMUNITY)
Admission: EM | Admit: 2018-01-04 | Discharge: 2018-01-04 | Disposition: A | Discharge status: Home / Self Care | Payer: Medicaid Other | Attending: Family Medicine | Admitting: Family Medicine

## 2018-01-04 ENCOUNTER — Encounter (HOSPITAL_COMMUNITY): Payer: Self-pay | Admitting: Emergency Medicine

## 2018-01-04 DIAGNOSIS — M79641 Pain in right hand: Secondary | ICD-10-CM | POA: Diagnosis not present

## 2018-01-04 DIAGNOSIS — M109 Gout, unspecified: Secondary | ICD-10-CM

## 2018-01-04 DIAGNOSIS — M10041 Idiopathic gout, right hand: Secondary | ICD-10-CM

## 2018-01-04 HISTORY — DX: Gout, unspecified: M10.9

## 2018-01-04 MED ORDER — KETOROLAC TROMETHAMINE 60 MG/2ML IM SOLN
INTRAMUSCULAR | Status: AC
  Filled 2018-01-04: qty 2

## 2018-01-04 MED ORDER — KETOROLAC TROMETHAMINE 60 MG/2ML IM SOLN
60.0000 mg | Freq: Once | INTRAMUSCULAR | Status: AC
  Administered 2018-01-04: 60 mg via INTRAMUSCULAR

## 2018-01-04 MED ORDER — DICLOFENAC SODIUM 75 MG PO TBEC: 75.0000 mg | DELAYED_RELEASE_TABLET | Freq: Two times a day (BID) | ORAL | 0 refills | Status: DC

## 2018-01-04 NOTE — ED Triage Notes (Signed)
Pt sts right hand pain with hx of gout and feels same

## 2018-01-04 NOTE — ED Provider Notes (Signed)
[facility]   [id number] 01/04/18 Arrival Time: 1252  ASSESSMENT & PLAN:  1. Right hand pain   2. Acute gout of right hand, unspecified cause     Meds ordered this encounter  Medications  . ketorolac (TORADOL) injection 60 mg  . diclofenac (VOLTAREN) 75 MG EC tablet    Sig: Take 1 tablet (75 mg total) by mouth 2 (two) times daily.    Dispense:  14 tablet    Refill:  0    Follow-up Information    [facility].   Specialty:  Urgent Care Why:  As needed. Contact information: [address] [place] [address] [phone]        Or will f/u if not improving. No s/s of infection/cellulitis.  Reviewed expectations re: course of current medical issues. Questions answered. Outlined signs and symptoms indicating need for more acute intervention. Patient verbalized understanding. After Visit Summary given.  SUBJECTIVE: History from: patient. [name] [name] is a 62 y.o. male who reports persistent mild to moderate pain of his right hand and wrist; described as aching without radiation. Onset: gradual, three days ago. Injury/trama: no. Relieved by: rest and holding still. Worsened by: movement. Associated symptoms: none reported. Extremity sensation changes or weakness: none. Self treatment: has not tried OTCs for relief of pain. History of similar: yes, same pain about 2 years ago; dx with gout; responded to anti-inflammatory. No new medications. Does report frequent alcohol use.  ROS: As per HPI.   OBJECTIVE:  Vitals:   01/04/18 1335  BP: (!) 190/109  Pulse: 75  Resp: 18  Temp: (!) 97.5 F (36.4 C)  TempSrc: Oral  SpO2: 95%    General appearance: alert; no distress Extremities: warm and well perfused; symmetrical with no gross deformities; diffuse tenderness over his right hand extending toward right wrist with mild swelling and no bruising; ROM: limited by pain CV: brisk extremity capillary  refill Skin: warm and dry Neurologic: normal gait; normal symmetric reflexes in all extremities; normal sensation in all extremities Psychological: alert and cooperative; normal mood and affect  Allergies  Allergen Reactions  . Hctz [Hydrochlorothiazide]     Erectile dysfunction    Past Medical History:  Diagnosis Date  . Arthritis    right hip  . Disc displacement, lumbar   . Gout   . Hypertension    Takes medications daily   Social History   Socioeconomic History  . Marital status: Single    Spouse name: Not on file  . Number of children: Not on file  . Years of education: Not on file  . Highest education level: Not on file  Occupational History  . Not on file  Social Needs  . Financial resource strain: Not on file  . Food insecurity:    Worry: Not on file    Inability: Not on file  . Transportation needs:    Medical: Not on file    Non-medical: Not on file  Tobacco Use  . Smoking status: Current Every Day Smoker    Packs/day: 0.30    Years: 35.00    Pack years: 10.50    Types: Cigarettes  . Smokeless tobacco: Never Used  Substance and Sexual Activity  . Alcohol use: Yes    Alcohol/week: 0.0 standard drinks    Comment: occasionally  . Drug use: Yes    Types: Marijuana, Cocaine    Comment: last used 3-4 days ago  . Sexual activity: Not Currently  Lifestyle  .  Physical activity:    Days per week: Not on file    Minutes per session: Not on file  . Stress: Not on file  Relationships  . Social connections:    Talks on phone: Not on file    Gets together: Not on file    Attends religious service: Not on file    Active member of club or organization: Not on file    Attends meetings of clubs or organizations: Not on file    Relationship status: Not on file  Other Topics Concern  . Not on file  Social History Narrative  . Not on file   Family History  Problem Relation Age of Onset  . Hypertension Mother    Past Surgical History:  Procedure  Laterality Date  . BACK SURGERY  1980s   x2:2013one back surgery  . COLONOSCOPY WITH PROPOFOL N/A 09/18/2013   Procedure: COLONOSCOPY WITH PROPOFOL;  Surgeon: Charolett BumpersMartin K Johnson, MD;  Location: [facility];  Service: Endoscopy;  Laterality: N/AMardella Layman;      Vannessa Godown, MD 01/04/18 1401

## 2019-03-25 ENCOUNTER — Other Ambulatory Visit: Payer: Self-pay | Admitting: Cardiology

## 2019-03-25 DIAGNOSIS — Z20822 Contact with and (suspected) exposure to covid-19: Secondary | ICD-10-CM

## 2019-03-27 LAB — NOVEL CORONAVIRUS, NAA: SARS-CoV-2, NAA: NOT DETECTED

## 2019-07-27 ENCOUNTER — Other Ambulatory Visit: Payer: Self-pay

## 2019-07-27 ENCOUNTER — Ambulatory Visit (HOSPITAL_COMMUNITY)
Admission: EM | Admit: 2019-07-27 | Discharge: 2019-07-27 | Disposition: A | Discharge status: Home / Self Care | Payer: Medicaid Other | Attending: Internal Medicine | Admitting: Internal Medicine

## 2019-07-27 ENCOUNTER — Encounter (HOSPITAL_COMMUNITY): Payer: Self-pay

## 2019-07-27 DIAGNOSIS — Z20822 Contact with and (suspected) exposure to covid-19: Secondary | ICD-10-CM

## 2019-07-27 DIAGNOSIS — R197 Diarrhea, unspecified: Secondary | ICD-10-CM

## 2019-07-27 DIAGNOSIS — R52 Pain, unspecified: Secondary | ICD-10-CM | POA: Diagnosis not present

## 2019-07-27 DIAGNOSIS — R5383 Other fatigue: Secondary | ICD-10-CM | POA: Diagnosis not present

## 2019-07-27 DIAGNOSIS — B349 Viral infection, unspecified: Secondary | ICD-10-CM | POA: Diagnosis present

## 2019-07-27 DIAGNOSIS — I10 Essential (primary) hypertension: Secondary | ICD-10-CM

## 2019-07-27 DIAGNOSIS — R0981 Nasal congestion: Secondary | ICD-10-CM

## 2019-07-27 NOTE — Discharge Instructions (Signed)
Covid swab pending, monitor my chart for results, we will only call if positive If result positive you will need to stay out for full 10 days from symptom onset, symptoms improving and fever free for 24 hours without medicine  Alternate Tylenol and ibuprofen to help with body aches, headache Rest and drink plenty of fluids May use Imodium or Pepto-Bismol if having extremely frequent diarrhea, otherwise focus on fluids  Please follow-up if any symptoms not improving or worsening, developing difficulty breathing, shortness of breath, abdominal pain, dizziness or lightheadedness

## 2019-07-27 NOTE — ED Triage Notes (Signed)
Pt states he has diarrhea and body aches. Pt states he has loss his sense of taste. X 3 days. Pt states his room has Covid.

## 2019-07-27 NOTE — ED Provider Notes (Signed)
[facility]    CSN: [id number] Arrival date & time: 07/27/19  1048      History   Chief Complaint Chief Complaint  Patient presents with  . Covid Exposure    HPI [name] [name] is a 64 y.o. male history of hypertension, gout, presenting today for evaluation of fatigue, body aches and diarrhea.  Patient states that symptoms began approximately 3 days ago.  He notes that his roommate tested positive for Covid recently, last exposure approximately 1.5 weeks ago.  He has had some mild nasal congestion, denies cough or sore throat.  Denies any known fevers.  Denies nausea vomiting or abdominal pain.  Denies blood in stool.  Denies chest pain or shortness of breath.  HPI  Past Medical History:  Diagnosis Date  . Arthritis    right hip  . Disc displacement, lumbar   . Gout   . Hypertension    Takes medications daily    Patient Active Problem List   Diagnosis Date Noted  . Alcohol dependence (HCC) 05/20/2014  . Hypertension 04/30/2011  . Spinal stenosis, lumbar region, with neurogenic claudication 02/01/2011  . Hepatitis C virus infection without hepatic coma 02/01/2011  . Chronic pain 02/01/2011  . HIP PAIN, RIGHT 02/02/2010    Past Surgical History:  Procedure Laterality Date  . BACK SURGERY  1980s   x2:2013one back surgery  . COLONOSCOPY WITH PROPOFOL N/A 09/18/2013   Procedure: COLONOSCOPY WITH PROPOFOL;  Surgeon: Charolett Bumpers, MD;  Location: [facility];  Service: Endoscopy;  Laterality: N/A;       Home Medications    Prior to Admission medications   Medication Sig Start Date End Date Taking? Authorizing Provider  amLODipine (NORVASC) 10 MG tablet TAKE 1 TABLET BY MOUTH DAILY. Patient not taking: Reported on 10/27/2015 09/28/14   Ambrose Finland, NP  diclofenac (VOLTAREN) 75 MG EC tablet Take 1 tablet (75 mg total) by mouth 2 (two) times daily. 01/04/18   Mardella Layman, MD  oxyCODONE-acetaminophen (ROXICET) 5-325 MG tablet Take 1 tablet by mouth every  6 (six) hours as needed for severe pain. Patient not taking: Reported on 01/04/2018 10/27/15   Joanna Puff, MD  hydrochlorothiazide (HYDRODIURIL) 25 MG tablet Take 1 tablet (25 mg total) by mouth daily. 05/17/11 07/26/11  Nestor Ramp, MD    Family History Family History  Problem Relation Age of Onset  . Hypertension Mother     Social History Social History   Tobacco Use  . Smoking status: Current Every Day Smoker    Packs/day: 0.30    Years: 35.00    Pack years: 10.50    Types: Cigarettes  . Smokeless tobacco: Never Used  Substance Use Topics  . Alcohol use: Yes    Alcohol/week: 0.0 standard drinks    Comment: occasionally  . Drug use: Yes    Types: Marijuana, Cocaine    Comment: last used 3-4 days ago     Allergies   Hctz [hydrochlorothiazide]   Review of Systems Review of Systems  Constitutional: Positive for fatigue. Negative for activity change, appetite change, chills and fever.  HENT: Positive for congestion and rhinorrhea. Negative for ear pain, sinus pressure, sore throat and trouble swallowing.   Eyes: Negative for discharge and redness.  Respiratory: Negative for cough, chest tightness and shortness of breath.   Cardiovascular: Negative for chest pain.  Gastrointestinal: Positive for diarrhea. Negative for abdominal pain, nausea and vomiting.  Musculoskeletal: Positive for myalgias.  Skin: Negative for rash.  Neurological:  Negative for dizziness, light-headedness and headaches.     Physical Exam Triage Vital Signs ED Triage Vitals  Enc Vitals Group     BP 07/27/19 1128 (!) 158/96     Pulse Rate 07/27/19 1128 72     Resp 07/27/19 1128 18     Temp 07/27/19 1128 97.8 F (36.6 C)     Temp Source 07/27/19 1128 Oral     SpO2 07/27/19 1128 99 %     Weight 07/27/19 1127 197 lb 6.4 oz (89.5 kg)     Height --      Head Circumference --      Peak Flow --      Pain Score 07/27/19 1127 7     Pain Loc --      Pain Edu? --      Excl. in [place]? --    No  data found.  Updated Vital Signs BP (!) 158/96 (BP Location: Right Arm)   Pulse 72   Temp 97.8 F (36.6 C) (Oral)   Resp 18   Wt 197 lb 6.4 oz (89.5 kg)   SpO2 99%   BMI 26.04 kg/m   Visual Acuity Right Eye Distance:   Left Eye Distance:   Bilateral Distance:    Right Eye Near:   Left Eye Near:    Bilateral Near:     Physical Exam Vitals and nursing note reviewed.  Constitutional:      Appearance: He is well-developed.     Comments: No acute distress  HENT:     Head: Normocephalic and atraumatic.     Ears:     Comments: Bilateral ears without tenderness to palpation of external auricle, tragus and mastoid, EAC's without erythema or swelling, TM's with good bony landmarks and cone of light. Non erythematous.     Nose: Nose normal.     Mouth/Throat:     Comments: Oral mucosa pink and moist, no tonsillar enlargement or exudate. Posterior pharynx patent and nonerythematous, no uvula deviation or swelling. Normal phonation. Eyes:     Conjunctiva/sclera: Conjunctivae normal.  Cardiovascular:     Rate and Rhythm: Normal rate.  Pulmonary:     Effort: Pulmonary effort is normal. No respiratory distress.     Comments: Breathing comfortably at rest, CTABL, no wheezing, rales or other adventitious sounds auscultated  Abdominal:     General: There is no distension.     Comments: Soft, nondistended, nontender to light and deep palpation throughout abdomen  Musculoskeletal:        General: Normal range of motion.     Cervical back: Neck supple.  Skin:    General: Skin is warm and dry.  Neurological:     Mental Status: He is alert and oriented to person, place, and time.      UC Treatments / Results  Labs (all labs ordered are listed, but only abnormal results are displayed) Labs Reviewed  SARS CORONAVIRUS 2 (TAT 6-24 HRS)    EKG   Radiology No results found.  Procedures Procedures (including critical care time)  Medications Ordered in UC Medications - No  data to display  Initial Impression / Assessment and Plan / UC Course  I have reviewed the triage vital signs and the nursing notes.  Pertinent labs & imaging results that were available during my care of the patient were reviewed by me and considered in my medical decision making (see chart for details).     Fatigue body aches and diarrhea x3 days, no associated  abdominal pain, do not suspect abdominal emergency.  Most likely viral etiology, as suspicion of Covid given recent exposure.  Covid PCR pending.  Advised to monitor my chart for results, will call if positive.  Discussed quarantining recommendations.  Rest, fluids.  Symptomatic and supportive care.  Discussed strict return precautions. Patient verbalized understanding and is agreeable with plan.  Final Clinical Impressions(s) / UC Diagnoses   Final diagnoses:  Viral illness  Diarrhea, unspecified type  Exposure to COVID-19 virus     Discharge Instructions     Covid swab pending, monitor my chart for results, we will only call if positive If result positive you will need to stay out for full 10 days from symptom onset, symptoms improving and fever free for 24 hours without medicine  Alternate Tylenol and ibuprofen to help with body aches, headache Rest and drink plenty of fluids May use Imodium or Pepto-Bismol if having extremely frequent diarrhea, otherwise focus on fluids  Please follow-up if any symptoms not improving or worsening, developing difficulty breathing, shortness of breath, abdominal pain, dizziness or lightheadedness   ED Prescriptions    None     PDMP not reviewed this encounter.   Sharyon Cable [place] C, PA-C 07/27/19 1213

## 2019-07-28 LAB — SARS CORONAVIRUS 2 (TAT 6-24 HRS): SARS Coronavirus 2: NEGATIVE

## 2020-01-10 ENCOUNTER — Encounter: Payer: Self-pay | Admitting: Neurology

## 2020-03-21 ENCOUNTER — Ambulatory Visit: Payer: Medicaid Other | Admitting: Neurology

## 2020-06-14 ENCOUNTER — Other Ambulatory Visit: Payer: Self-pay

## 2020-06-14 ENCOUNTER — Encounter (HOSPITAL_COMMUNITY): Payer: Self-pay | Admitting: Emergency Medicine

## 2020-06-14 ENCOUNTER — Inpatient Hospital Stay (HOSPITAL_COMMUNITY)
Admission: EM | Admit: 2020-06-14 | Discharge: 2020-06-22 | DRG: 166 | Disposition: A | Discharge status: Home / Self Care | Payer: Medicaid Other | Attending: Internal Medicine | Admitting: Internal Medicine

## 2020-06-14 ENCOUNTER — Emergency Department (HOSPITAL_COMMUNITY): Payer: Medicaid Other

## 2020-06-14 DIAGNOSIS — Z20822 Contact with and (suspected) exposure to covid-19: Secondary | ICD-10-CM | POA: Diagnosis present

## 2020-06-14 DIAGNOSIS — J851 Abscess of lung with pneumonia: Secondary | ICD-10-CM

## 2020-06-14 DIAGNOSIS — M5126 Other intervertebral disc displacement, lumbar region: Secondary | ICD-10-CM | POA: Diagnosis present

## 2020-06-14 DIAGNOSIS — D75839 Thrombocytosis, unspecified: Secondary | ICD-10-CM | POA: Diagnosis present

## 2020-06-14 DIAGNOSIS — Z888 Allergy status to other drugs, medicaments and biological substances status: Secondary | ICD-10-CM

## 2020-06-14 DIAGNOSIS — Z9889 Other specified postprocedural states: Secondary | ICD-10-CM

## 2020-06-14 DIAGNOSIS — D6489 Other specified anemias: Secondary | ICD-10-CM | POA: Diagnosis present

## 2020-06-14 DIAGNOSIS — F1010 Alcohol abuse, uncomplicated: Secondary | ICD-10-CM | POA: Diagnosis present

## 2020-06-14 DIAGNOSIS — B1920 Unspecified viral hepatitis C without hepatic coma: Secondary | ICD-10-CM | POA: Diagnosis present

## 2020-06-14 DIAGNOSIS — M8448XA Pathological fracture, other site, initial encounter for fracture: Secondary | ICD-10-CM | POA: Diagnosis present

## 2020-06-14 DIAGNOSIS — J159 Unspecified bacterial pneumonia: Secondary | ICD-10-CM | POA: Diagnosis present

## 2020-06-14 DIAGNOSIS — M109 Gout, unspecified: Secondary | ICD-10-CM | POA: Diagnosis present

## 2020-06-14 DIAGNOSIS — E860 Dehydration: Secondary | ICD-10-CM | POA: Diagnosis present

## 2020-06-14 DIAGNOSIS — J984 Other disorders of lung: Secondary | ICD-10-CM

## 2020-06-14 DIAGNOSIS — J189 Pneumonia, unspecified organism: Secondary | ICD-10-CM

## 2020-06-14 DIAGNOSIS — R3129 Other microscopic hematuria: Secondary | ICD-10-CM | POA: Diagnosis present

## 2020-06-14 DIAGNOSIS — H5462 Unqualified visual loss, left eye, normal vision right eye: Secondary | ICD-10-CM | POA: Diagnosis present

## 2020-06-14 DIAGNOSIS — Z8249 Family history of ischemic heart disease and other diseases of the circulatory system: Secondary | ICD-10-CM

## 2020-06-14 DIAGNOSIS — M19071 Primary osteoarthritis, right ankle and foot: Secondary | ICD-10-CM | POA: Diagnosis present

## 2020-06-14 DIAGNOSIS — H348122 Central retinal vein occlusion, left eye, stable: Secondary | ICD-10-CM | POA: Diagnosis present

## 2020-06-14 DIAGNOSIS — R6889 Other general symptoms and signs: Secondary | ICD-10-CM

## 2020-06-14 DIAGNOSIS — H5460 Unqualified visual loss, one eye, unspecified: Secondary | ICD-10-CM

## 2020-06-14 DIAGNOSIS — J850 Gangrene and necrosis of lung: Principal | ICD-10-CM | POA: Diagnosis present

## 2020-06-14 DIAGNOSIS — G629 Polyneuropathy, unspecified: Secondary | ICD-10-CM | POA: Diagnosis present

## 2020-06-14 DIAGNOSIS — H4902 Third [oculomotor] nerve palsy, left eye: Secondary | ICD-10-CM | POA: Diagnosis present

## 2020-06-14 DIAGNOSIS — R042 Hemoptysis: Secondary | ICD-10-CM | POA: Diagnosis present

## 2020-06-14 DIAGNOSIS — I10 Essential (primary) hypertension: Secondary | ICD-10-CM | POA: Diagnosis present

## 2020-06-14 DIAGNOSIS — E871 Hypo-osmolality and hyponatremia: Secondary | ICD-10-CM | POA: Diagnosis present

## 2020-06-14 DIAGNOSIS — M1611 Unilateral primary osteoarthritis, right hip: Secondary | ICD-10-CM | POA: Diagnosis present

## 2020-06-14 DIAGNOSIS — H469 Unspecified optic neuritis: Secondary | ICD-10-CM | POA: Diagnosis present

## 2020-06-14 DIAGNOSIS — F17210 Nicotine dependence, cigarettes, uncomplicated: Secondary | ICD-10-CM | POA: Diagnosis present

## 2020-06-14 LAB — CBC WITH DIFFERENTIAL/PLATELET
Abs Immature Granulocytes: 0.2 10*3/uL — ABNORMAL HIGH (ref 0.00–0.07)
Basophils Absolute: 0 10*3/uL (ref 0.0–0.1)
Basophils Relative: 0 %
Eosinophils Absolute: 0 10*3/uL (ref 0.0–0.5)
Eosinophils Relative: 0 %
HCT: 31.4 % — ABNORMAL LOW (ref 39.0–52.0)
Hemoglobin: 10 g/dL — ABNORMAL LOW (ref 13.0–17.0)
Immature Granulocytes: 1 %
Lymphocytes Relative: 6 %
Lymphs Abs: 1.1 10*3/uL (ref 0.7–4.0)
MCH: 31.6 pg (ref 26.0–34.0)
MCHC: 31.8 g/dL (ref 30.0–36.0)
MCV: 99.4 fL (ref 80.0–100.0)
Monocytes Absolute: 1.5 10*3/uL — ABNORMAL HIGH (ref 0.1–1.0)
Monocytes Relative: 9 %
Neutro Abs: 14 10*3/uL — ABNORMAL HIGH (ref 1.7–7.7)
Neutrophils Relative %: 84 %
Platelets: 458 10*3/uL — ABNORMAL HIGH (ref 150–400)
RBC: 3.16 MIL/uL — ABNORMAL LOW (ref 4.22–5.81)
RDW: 13.3 % (ref 11.5–15.5)
WBC: 16.8 10*3/uL — ABNORMAL HIGH (ref 4.0–10.5)
nRBC: 0.2 % (ref 0.0–0.2)

## 2020-06-14 LAB — COMPREHENSIVE METABOLIC PANEL
ALT: 26 U/L (ref 0–44)
AST: 31 U/L (ref 15–41)
Albumin: 2.5 g/dL — ABNORMAL LOW (ref 3.5–5.0)
Alkaline Phosphatase: 69 U/L (ref 38–126)
Anion gap: 13 (ref 5–15)
BUN: 14 mg/dL (ref 8–23)
CO2: 26 mmol/L (ref 22–32)
Calcium: 9 mg/dL (ref 8.9–10.3)
Chloride: 90 mmol/L — ABNORMAL LOW (ref 98–111)
Creatinine, Ser: 1.28 mg/dL — ABNORMAL HIGH (ref 0.61–1.24)
GFR, Estimated: >60 mL/min (ref >60)
Glucose, Bld: 118 mg/dL — ABNORMAL HIGH (ref 70–99)
Potassium: 3.5 mmol/L (ref 3.5–5.1)
Sodium: 129 mmol/L — ABNORMAL LOW (ref 135–145)
Total Bilirubin: 1 mg/dL (ref 0.3–1.2)
Total Protein: 7.6 g/dL (ref 6.5–8.1)

## 2020-06-14 LAB — LACTIC ACID, PLASMA: Lactic Acid, Venous: 1.1 mmol/L (ref 0.5–1.9)

## 2020-06-14 LAB — SARS CORONAVIRUS 2 BY RT PCR (HOSPITAL ORDER, PERFORMED IN ~~LOC~~ HOSPITAL LAB): SARS Coronavirus 2: NEGATIVE

## 2020-06-14 MED ORDER — ACETAMINOPHEN 325 MG PO TABS
650.0000 mg | ORAL_TABLET | Freq: Once | ORAL | Status: AC | PRN
  Administered 2020-06-14: 650 mg via ORAL
  Filled 2020-06-14: qty 2

## 2020-06-14 NOTE — ED Triage Notes (Signed)
Pt reports pain all over, vomiting, and hiccups x 4 days.  Temp 102.1  Also reports loss of vision in L eye that started 2 days after getting 2nd COVID vaccine in October.  States he hasn't been seen for it and thought it would get better.

## 2020-06-15 ENCOUNTER — Emergency Department (HOSPITAL_COMMUNITY): Payer: Medicaid Other

## 2020-06-15 DIAGNOSIS — E860 Dehydration: Secondary | ICD-10-CM | POA: Diagnosis present

## 2020-06-15 DIAGNOSIS — D6489 Other specified anemias: Secondary | ICD-10-CM | POA: Diagnosis present

## 2020-06-15 DIAGNOSIS — H5462 Unqualified visual loss, left eye, normal vision right eye: Secondary | ICD-10-CM | POA: Diagnosis present

## 2020-06-15 DIAGNOSIS — F1010 Alcohol abuse, uncomplicated: Secondary | ICD-10-CM | POA: Diagnosis present

## 2020-06-15 DIAGNOSIS — B1920 Unspecified viral hepatitis C without hepatic coma: Secondary | ICD-10-CM | POA: Diagnosis present

## 2020-06-15 DIAGNOSIS — M5126 Other intervertebral disc displacement, lumbar region: Secondary | ICD-10-CM | POA: Diagnosis present

## 2020-06-15 DIAGNOSIS — H547 Unspecified visual loss: Secondary | ICD-10-CM | POA: Diagnosis not present

## 2020-06-15 DIAGNOSIS — M19071 Primary osteoarthritis, right ankle and foot: Secondary | ICD-10-CM | POA: Diagnosis present

## 2020-06-15 DIAGNOSIS — D75839 Thrombocytosis, unspecified: Secondary | ICD-10-CM | POA: Diagnosis present

## 2020-06-15 DIAGNOSIS — H469 Unspecified optic neuritis: Secondary | ICD-10-CM | POA: Diagnosis present

## 2020-06-15 DIAGNOSIS — J984 Other disorders of lung: Secondary | ICD-10-CM | POA: Diagnosis not present

## 2020-06-15 DIAGNOSIS — M1611 Unilateral primary osteoarthritis, right hip: Secondary | ICD-10-CM | POA: Diagnosis present

## 2020-06-15 DIAGNOSIS — J189 Pneumonia, unspecified organism: Secondary | ICD-10-CM | POA: Diagnosis not present

## 2020-06-15 DIAGNOSIS — D649 Anemia, unspecified: Secondary | ICD-10-CM | POA: Diagnosis not present

## 2020-06-15 DIAGNOSIS — J850 Gangrene and necrosis of lung: Secondary | ICD-10-CM | POA: Diagnosis present

## 2020-06-15 DIAGNOSIS — H348122 Central retinal vein occlusion, left eye, stable: Secondary | ICD-10-CM | POA: Diagnosis present

## 2020-06-15 DIAGNOSIS — A419 Sepsis, unspecified organism: Secondary | ICD-10-CM | POA: Diagnosis not present

## 2020-06-15 DIAGNOSIS — E871 Hypo-osmolality and hyponatremia: Secondary | ICD-10-CM | POA: Diagnosis present

## 2020-06-15 DIAGNOSIS — R3129 Other microscopic hematuria: Secondary | ICD-10-CM | POA: Diagnosis present

## 2020-06-15 DIAGNOSIS — G629 Polyneuropathy, unspecified: Secondary | ICD-10-CM | POA: Diagnosis present

## 2020-06-15 DIAGNOSIS — H4902 Third [oculomotor] nerve palsy, left eye: Secondary | ICD-10-CM | POA: Diagnosis present

## 2020-06-15 DIAGNOSIS — M8448XA Pathological fracture, other site, initial encounter for fracture: Secondary | ICD-10-CM | POA: Diagnosis present

## 2020-06-15 DIAGNOSIS — R042 Hemoptysis: Secondary | ICD-10-CM | POA: Diagnosis present

## 2020-06-15 DIAGNOSIS — H5460 Unqualified visual loss, one eye, unspecified: Secondary | ICD-10-CM | POA: Diagnosis not present

## 2020-06-15 DIAGNOSIS — J159 Unspecified bacterial pneumonia: Secondary | ICD-10-CM | POA: Diagnosis present

## 2020-06-15 DIAGNOSIS — I10 Essential (primary) hypertension: Secondary | ICD-10-CM | POA: Diagnosis present

## 2020-06-15 DIAGNOSIS — M109 Gout, unspecified: Secondary | ICD-10-CM | POA: Diagnosis present

## 2020-06-15 DIAGNOSIS — J851 Abscess of lung with pneumonia: Secondary | ICD-10-CM | POA: Diagnosis present

## 2020-06-15 DIAGNOSIS — Z20822 Contact with and (suspected) exposure to covid-19: Secondary | ICD-10-CM | POA: Diagnosis present

## 2020-06-15 DIAGNOSIS — J181 Lobar pneumonia, unspecified organism: Secondary | ICD-10-CM | POA: Diagnosis not present

## 2020-06-15 DIAGNOSIS — F17210 Nicotine dependence, cigarettes, uncomplicated: Secondary | ICD-10-CM | POA: Diagnosis present

## 2020-06-15 LAB — URINALYSIS, ROUTINE W REFLEX MICROSCOPIC
Bilirubin Urine: NEGATIVE
Glucose, UA: NEGATIVE mg/dL
Ketones, ur: 5 mg/dL — AB
Nitrite: NEGATIVE
Protein, ur: NEGATIVE mg/dL
Specific Gravity, Urine: 1.011 (ref 1.005–1.030)
pH: 5 (ref 5.0–8.0)

## 2020-06-15 LAB — CBC
HCT: 28.8 % — ABNORMAL LOW (ref 39.0–52.0)
Hemoglobin: 8.9 g/dL — ABNORMAL LOW (ref 13.0–17.0)
MCH: 30.7 pg (ref 26.0–34.0)
MCHC: 30.9 g/dL (ref 30.0–36.0)
MCV: 99.3 fL (ref 80.0–100.0)
Platelets: 454 10*3/uL — ABNORMAL HIGH (ref 150–400)
RBC: 2.9 MIL/uL — ABNORMAL LOW (ref 4.22–5.81)
RDW: 13.7 % (ref 11.5–15.5)
WBC: 17.9 10*3/uL — ABNORMAL HIGH (ref 4.0–10.5)
nRBC: 0.1 % (ref 0.0–0.2)

## 2020-06-15 LAB — RESP PANEL BY RT-PCR (FLU A&B, COVID) ARPGX2
Influenza A by PCR: NEGATIVE
Influenza B by PCR: NEGATIVE
SARS Coronavirus 2 by RT PCR: NEGATIVE

## 2020-06-15 LAB — BASIC METABOLIC PANEL
Anion gap: 11 (ref 5–15)
BUN: 16 mg/dL (ref 8–23)
CO2: 28 mmol/L (ref 22–32)
Calcium: 8.4 mg/dL — ABNORMAL LOW (ref 8.9–10.3)
Chloride: 94 mmol/L — ABNORMAL LOW (ref 98–111)
Creatinine, Ser: 1.28 mg/dL — ABNORMAL HIGH (ref 0.61–1.24)
GFR, Estimated: >60 mL/min (ref >60)
Glucose, Bld: 97 mg/dL (ref 70–99)
Potassium: 3.7 mmol/L (ref 3.5–5.1)
Sodium: 133 mmol/L — ABNORMAL LOW (ref 135–145)

## 2020-06-15 LAB — HIV ANTIBODY (ROUTINE TESTING W REFLEX): HIV Screen 4th Generation wRfx: NONREACTIVE

## 2020-06-15 MED ORDER — LISINOPRIL 20 MG PO TABS
40.0000 mg | ORAL_TABLET | Freq: Every day | ORAL | Status: DC
  Administered 2020-06-15: 40 mg via ORAL
  Filled 2020-06-15 (×2): qty 2

## 2020-06-15 MED ORDER — FOLIC ACID 1 MG PO TABS
1.0000 mg | ORAL_TABLET | Freq: Every day | ORAL | Status: DC
  Administered 2020-06-15 – 2020-06-22 (×8): 1 mg via ORAL
  Filled 2020-06-15 (×8): qty 1

## 2020-06-15 MED ORDER — ADULT MULTIVITAMIN W/MINERALS CH
1.0000 | ORAL_TABLET | Freq: Every day | ORAL | Status: DC
  Administered 2020-06-15 – 2020-06-22 (×8): 1 via ORAL
  Filled 2020-06-15 (×8): qty 1

## 2020-06-15 MED ORDER — THIAMINE HCL 100 MG PO TABS
100.0000 mg | ORAL_TABLET | Freq: Every day | ORAL | Status: DC
  Administered 2020-06-15 – 2020-06-22 (×8): 100 mg via ORAL
  Filled 2020-06-15 (×8): qty 1

## 2020-06-15 MED ORDER — THIAMINE HCL 100 MG/ML IJ SOLN
100.0000 mg | Freq: Every day | INTRAMUSCULAR | Status: DC
  Filled 2020-06-15: qty 2

## 2020-06-15 MED ORDER — AZITHROMYCIN 250 MG PO TABS
500.0000 mg | ORAL_TABLET | Freq: Once | ORAL | Status: DC
  Filled 2020-06-15: qty 2

## 2020-06-15 MED ORDER — SODIUM CHLORIDE 0.9 % IV BOLUS
1000.0000 mL | Freq: Once | INTRAVENOUS | Status: AC
  Administered 2020-06-15: 1000 mL via INTRAVENOUS

## 2020-06-15 MED ORDER — PIPERACILLIN-TAZOBACTAM 3.375 G IVPB 30 MIN
3.3750 g | Freq: Three times a day (TID) | INTRAVENOUS | Status: DC
  Administered 2020-06-15: 3.375 g via INTRAVENOUS
  Filled 2020-06-15: qty 50

## 2020-06-15 MED ORDER — TETRACAINE HCL 0.5 % OP SOLN
2.0000 [drp] | Freq: Once | OPHTHALMIC | Status: AC
  Administered 2020-06-15: 2 [drp] via OPHTHALMIC
  Filled 2020-06-15: qty 4

## 2020-06-15 MED ORDER — SODIUM CHLORIDE 0.9 % IV SOLN
2.0000 g | Freq: Once | INTRAVENOUS | Status: DC
  Filled 2020-06-15: qty 20

## 2020-06-15 MED ORDER — PIPERACILLIN-TAZOBACTAM 3.375 G IVPB
3.3750 g | Freq: Three times a day (TID) | INTRAVENOUS | Status: DC
  Administered 2020-06-16 – 2020-06-17 (×4): 3.375 g via INTRAVENOUS
  Filled 2020-06-15 (×5): qty 50

## 2020-06-15 MED ORDER — ACETAMINOPHEN 325 MG PO TABS
650.0000 mg | ORAL_TABLET | Freq: Four times a day (QID) | ORAL | Status: DC | PRN
  Administered 2020-06-15 – 2020-06-21 (×6): 650 mg via ORAL
  Filled 2020-06-15 (×7): qty 2

## 2020-06-15 MED ORDER — ENOXAPARIN SODIUM 40 MG/0.4ML ~~LOC~~ SOLN
40.0000 mg | SUBCUTANEOUS | Status: DC
  Administered 2020-06-15 – 2020-06-16 (×2): 40 mg via SUBCUTANEOUS
  Filled 2020-06-15 (×2): qty 0.4

## 2020-06-15 MED ORDER — ACETAMINOPHEN 650 MG RE SUPP: 650.0000 mg | Freq: Four times a day (QID) | RECTAL | Status: DC | PRN

## 2020-06-15 MED ORDER — IOHEXOL 350 MG/ML SOLN
75.0000 mL | Freq: Once | INTRAVENOUS | Status: AC | PRN
  Administered 2020-06-15: 75 mL via INTRAVENOUS

## 2020-06-15 MED ORDER — ONDANSETRON HCL 4 MG/2ML IJ SOLN
4.0000 mg | Freq: Once | INTRAMUSCULAR | Status: AC
  Administered 2020-06-15: 4 mg via INTRAVENOUS
  Filled 2020-06-15: qty 2

## 2020-06-15 MED ORDER — GUAIFENESIN-DM 100-10 MG/5ML PO SYRP
5.0000 mL | ORAL_SOLUTION | ORAL | Status: DC | PRN
  Administered 2020-06-15 – 2020-06-21 (×4): 5 mL via ORAL
  Filled 2020-06-15 (×4): qty 5

## 2020-06-15 NOTE — ED Notes (Addendum)
Visual Acuity  Right 20/20 Left 0

## 2020-06-15 NOTE — Progress Notes (Signed)
Pharmacy Antibiotic Note  [name] [name] is a 65 y.o. male admitted on 06/14/2020 with pneumonia, cavitary.  Pharmacy has been consulted for Zosyn dosing.  Plan: Zosyn 3.375g IV every 8 hours (extended infusion) Monitor renal function, clinical progression and LOT     Temp (24hrs), Avg:100 F (37.8 C), Min:98.3 F (36.8 C), Max:102.1 F (38.9 C)  Recent Labs  Lab 06/14/20 1558  WBC 16.8*  CREATININE 1.28*  LATICACIDVEN 1.1    CrCl cannot be calculated (Unknown ideal weight.).    Allergies  Allergen Reactions  . Hctz [Hydrochlorothiazide]     Erectile dysfunction    Daylene Posey, PharmD Clinical Pharmacist ED Pharmacist Phone # [phone] 06/15/2020 10:08 AM

## 2020-06-15 NOTE — ED Notes (Signed)
Pt's walking Sa02 was 96% on RA

## 2020-06-15 NOTE — H&P (Addendum)
Date: 06/15/2020               Patient Name:  [name] [name] MRN: [id number]  DOB: [birth year] Age / Sex: 65 y.o., male   PCP: Patient, No Pcp Per              Medical Service: Internal Medicine Teaching Service              Attending Physician: Dr. Reymundo Poll, MD    First Contact: Audria Nine, MS 3 Pager: [phone]  Second Contact: Dr. Marijo Conception Pager: [phone]  Third Contact Dr. Ephriam Knuckles Pager: [phone]       After Hours (After 5p/  First Contact Pager: [phone]  weekends / holidays): Second Contact Pager: [phone]   Chief Complaint: Cough  History of Present Illness:   65 y.o male with hx of HTN, Hep C, spinal stenosis presenting with two weeks long duration of productive cough, tactile fevers, sob, chills, and generalized weakness. Some associated hemoptysis. Additionally reports watering to eyes, rhinorrhea, and throat soreness with coughing. He has tried varous OTC cough medications without relief. He has had 1-2 weeks of diarrhea with loose stools 3-4 times a day, and continues with looser stools. Denies known sick contacts. Denies headache, or chest pain.   He reports has had non-bloody emesis in prior days, which has resolved. Reports was not able to keep anything down in the past five days, but has now been drinking fluids without concern.   Patient reports experienced headaches two days following second COVID vaccine in October. Had vision loss in left eye shortly after, which he has not regained. Conveys he has not seen an opthomologist or other medical personnel for this, as he expected his vision to return.     ED course Patient arrived by car escorted by a friend. Febrile to 102.1 and tachypnic in 20s-30s. Negative coronavirus PCR, Respiratory panel pending. Normal lactic acid at 1.1. Na 129, Cl 90, K+ 3.5, Bicarb 26, Cr 1.28. CBC showing WBC of 16.8 with left shift, Hgb 10, Platelets 458. Hazy urine with large Hgb per dipstick, 6-10 RBCs, ketonuria of 5, trace  leukocytes. MRSA screen pending. Contrasted CTA showed lobar pneumonia in RLL with potential early cavitation and reactive mediastinal and right hilar lymph nodes; No pleural effusion. CT head without contrast showed evidence of small vessel disease and chronic encephalomalacia; disconjugate gaze, and chronic left lamina papyracea fracture. Started on Zosyn, and given 1L NS bolus, tylenol and tetracaine ophthalmic solution to left eye.    Meds:  Patient reports taking: Lisinopril 40mg   Current Meds  Medication Sig  . lisinopril (ZESTRIL) 40 MG tablet Take 40 mg by mouth daily.  . [DISCONTINUED] amLODipine (NORVASC) 10 MG tablet Take 10 mg by mouth daily.     Allergies: Allergies as of 06/14/2020 - Review Complete 07/27/2019  Allergen Reaction Noted  . Hctz [hydrochlorothiazide]  08/29/2011   Past Medical History:  Diagnosis Date  . Arthritis    right hip  . Disc displacement, lumbar   . Gout   . Hypertension    Takes medications daily    Family History: Conveys extensive family hx of HTN.   Social History:  Patient reports lives alone in an apartment. No pets.  Smokes 3-4 cigarettes a day 40oz alcohol a week Takes Marijuana everyday No other substance use per patient    Review of Systems: A complete ROS was negative except as per HPI.   Physical Exam: Blood pressure 133/82, pulse 91, temperature 100 F (  37.8 C), temperature source Oral, resp. rate (!) 25, SpO2 91 %.    Physical Exam Constitutional:      General: He is not in acute distress.    Appearance: He is ill-appearing. He is not toxic-appearing or diaphoretic.     Comments: Patient seen lying in bed, with intermittent hacking cough, NAD. Able to speak in complete sentences. Some hoarseness. Transitioned to seated position with ease.   HENT:     Mouth/Throat:     Dentition: Dental caries (numerous) present.  Cardiovascular:     Rate and Rhythm: Normal rate and regular rhythm.  Pulmonary:     Effort: No  respiratory distress.     Breath sounds: Rales present.  Abdominal:     Palpations: Abdomen is soft.     Tenderness: There is no abdominal tenderness.  Musculoskeletal:        General: No swelling.     Right lower leg: No edema.     Left lower leg: No edema.  Skin:    General: Skin is warm.     Comments: Appropriate skin turgur  Neurological:     Mental Status: He is alert.     Comments: Little to no pupillary response on left     EKG: personally reviewed my interpretation is NSR  CXR: personally reviewed my interpretation is reticulonodular opacities along right cardiac border    Assessment & Plan by Problem: Active Problems:   Necrotizing pneumonia (HCC)  Necrotizing Pneumonia Hyponatremia Thrombocytosis  Patient presenting with two weeks of productive cough, tactile fevers, and chills. Labs significant for leukocytosis with left shift. Mild to moderate alcohol consumption per patient report. Also endorsed diarrhea, eye tearing, and rhinorrhea. Likely superimposed bacterial infection. Potential aspiration component in light of alcohol use and dentition. Hyponatremic to 129 in the setting of pneumonia, and notable emesis PTA. Patient febrile to 102.1, tachypnic in 20-30s, though maintaining proper oxygenation.  Normal lactic acid reassuring. Contrasted CTA showed RLL pneumonia with consolidation and potential early cavitation, indicative of necrotizing pneumonia; and reactive lymph nodes; no pleural effusion.  -F/u MRSA PCR -F/u blood cultures -f/u quantiferon -Continue Zosyn per pharmacy -Robitussin PRN -Tylenol PRN -Trend CBC  Mild hyponatremia. Continue to monitor.  Normocytic Anemia. No recent labs to establish baseline. Recommend outpatient. Normal colonoscopy in 2015. No evidence of active bleeding however hgb 10>8.9 since admission. -trend hgb -GI consult in AM if hgb continues to trend down -continue to monitor -will need outpatient follow up  HTN. Hold home  lisinopril at this time.  Chronic Monocular Vision Loss Patient reporting sudden onset monocular vision loss in left eye following Headache in October that has not recovered, few days after second COVID vaccine. Has not seen Ophthalmologist. Chronic left lamina papyracea fracture per CT head without contrast. -coordinate outpatient Ophthalmologist f/u  Microscopic hematuria. Recommend repeat UA in 4-6w.   Dispo: Admit patient to Inpatient with expected length of stay greater than 2 midnights.  Signed: Elige Radon, MD Internal Medicine Resident PGY-2 [facility] Pager: [phone] 06/15/2020 7:13 PM

## 2020-06-15 NOTE — ED Provider Notes (Signed)
[facility] EMERGENCY DEPARTMENT Provider Note   CSN: [id number] Arrival date & time: 06/14/20  1530     History Chief Complaint  Patient presents with  . Fever  . Vomiting    [name] [name] is a 65 y.o. male.  HPI     This is a 65 year old male with a history of hypertension who presents with myalgias, nausea, vomiting, hiccups, cough.  Patient reports symptoms over the last 3 to 4 days.  He states he has not been able to keep anything down.  He also reports fever at home.  Temperature here 102.1.  He has not taken anything for symptoms.  He states that the cough is somewhat productive.  Occasionally he notes streaks of blood.  He denies chest pain or abdominal pain.  No known sick contacts or Covid exposures.  He has been double vaccinated against COVID-19.  Additionally he states that several months ago he developed a left-sided headache and subsequently has had progressive loss of vision in his left eye.  Denies any eye pain or headache at this time.  Denies any lower extremity swelling or history of blood clots.  Past Medical History:  Diagnosis Date  . Arthritis    right hip  . Disc displacement, lumbar   . Gout   . Hypertension    Takes medications daily    Patient Active Problem List   Diagnosis Date Noted  . Alcohol dependence (HCC) 05/20/2014  . Hypertension 04/30/2011  . Spinal stenosis, lumbar region, with neurogenic claudication 02/01/2011  . Hepatitis C virus infection without hepatic coma 02/01/2011  . Chronic pain 02/01/2011  . HIP PAIN, RIGHT 02/02/2010    Past Surgical History:  Procedure Laterality Date  . BACK SURGERY  1980s   x2:2013one back surgery  . COLONOSCOPY WITH PROPOFOL N/A 09/18/2013   Procedure: COLONOSCOPY WITH PROPOFOL;  Surgeon: Charolett Bumpers, MD;  Location: [facility] ENDOSCOPY;  Service: Endoscopy;  Laterality: N/A;       Family History  Problem Relation Age of Onset  . Hypertension Mother     Social History    Tobacco Use  . Smoking status: Current Every Day Smoker    Packs/day: 0.30    Years: 35.00    Pack years: 10.50    Types: Cigarettes  . Smokeless tobacco: Never Used  Substance Use Topics  . Alcohol use: Yes    Alcohol/week: 0.0 standard drinks    Comment: occasionally  . Drug use: Yes    Types: Marijuana, Cocaine    Comment: last used 3-4 days ago    Home Medications Prior to Admission medications   Medication Sig Start Date End Date Taking? Authorizing Provider  lisinopril (ZESTRIL) 40 MG tablet Take 40 mg by mouth daily.   Yes [provider]  hydrochlorothiazide (HYDRODIURIL) 25 MG tablet Take 1 tablet (25 mg total) by mouth daily. 05/17/11 07/26/11  Nestor Ramp, MD    Allergies    Hctz [hydrochlorothiazide]  Review of Systems   Review of Systems  Constitutional: Positive for fatigue and fever.  Eyes: Positive for visual disturbance.  Respiratory: Positive for cough. Negative for shortness of breath.   Cardiovascular: Negative for chest pain and leg swelling.  Gastrointestinal: Positive for nausea and vomiting. Negative for abdominal pain.  Musculoskeletal: Positive for myalgias.  All other systems reviewed and are negative.   Physical Exam Updated Vital Signs BP (!) 141/71 (BP Location: Right Arm)   Pulse (!) 108   Temp 98.3 F (36.8  C)   Resp (!) 23   SpO2 95%   Physical Exam Vitals and nursing note reviewed.  Constitutional:      Appearance: He is well-developed and well-nourished. He is ill-appearing.     Comments: Ill-appearing but nontoxic  HENT:     Head: Normocephalic and atraumatic.     Mouth/Throat:     Mouth: Mucous membranes are dry.  Eyes:     Pupils: Pupils are equal, round, and reactive to light.     Comments: Right pupil 4 mm and reactive, left pupil mid fixed, minimal direct or consensual reactivity, no light perception noted at the left eye Pressure left eye 24, pressure right eye 20   Cardiovascular:     Rate and Rhythm:  Regular rhythm. Tachycardia present.     Heart sounds: Normal heart sounds. No murmur heard.   Pulmonary:     Effort: Pulmonary effort is normal. No respiratory distress.     Breath sounds: Rales present. No wheezing.     Comments: Tachypnea with respiratory rate mid 30s to 40 at times Abdominal:     General: Bowel sounds are normal.     Palpations: Abdomen is soft.     Tenderness: There is no abdominal tenderness. There is no guarding or rebound.  Musculoskeletal:        General: No edema.     Cervical back: Neck supple.     Right lower leg: No edema.     Left lower leg: No edema.  Lymphadenopathy:     Cervical: No cervical adenopathy.  Skin:    General: Skin is warm and dry.  Neurological:     Mental Status: He is alert and oriented to person, place, and time.     Comments: 5 out of 5 strength in all 4 extremities, cranial nerves II through XII intact  Psychiatric:        Mood and Affect: Mood and affect and mood normal.     ED Results / Procedures / Treatments   Labs (all labs ordered are listed, but only abnormal results are displayed) Labs Reviewed  COMPREHENSIVE METABOLIC PANEL - Abnormal; Notable for the following components:      Result Value   Sodium 129 (*)    Chloride 90 (*)    Glucose, Bld 118 (*)    Creatinine, Ser 1.28 (*)    Albumin 2.5 (*)    All other components within normal limits  CBC WITH DIFFERENTIAL/PLATELET - Abnormal; Notable for the following components:   WBC 16.8 (*)    RBC 3.16 (*)    Hemoglobin 10.0 (*)    HCT 31.4 (*)    Platelets 458 (*)    Neutro Abs 14.0 (*)    Monocytes Absolute 1.5 (*)    Abs Immature Granulocytes 0.20 (*)    All other components within normal limits  URINALYSIS, ROUTINE W REFLEX MICROSCOPIC - Abnormal; Notable for the following components:   APPearance HAZY (*)    Hgb urine dipstick LARGE (*)    Ketones, ur 5 (*)    Leukocytes,Ua TRACE (*)    Bacteria, UA RARE (*)    All other components within normal  limits  SARS CORONAVIRUS 2 BY RT PCR (HOSPITAL ORDER, PERFORMED IN [facility])  SARS CORONAVIRUS 2 BY RT PCR (HOSPITAL ORDER, PERFORMED IN [facility])  RESPIRATORY PANEL BY RT PCR (FLU A&B, COVID)  LACTIC ACID, PLASMA  LACTIC ACID, PLASMA    EKG EKG Interpretation  Date/Time:  Saturday June 14 2020 15:43:19 EST Ventricular Rate:  112 PR Interval:  166 QRS Duration: 66 QT Interval:  334 QTC Calculation: 455 R Axis:   48 Text Interpretation: Sinus tachycardia Minimal voltage criteria for LVH, may be normal variant ( Sokolow-Lyon ) Borderline ECG Confirmed by Ross Marcus ([id number]) on 06/15/2020 5:03:33 AM   Radiology DG Chest Portable 1 View  Result Date: 06/14/2020 CLINICAL DATA:  Fever EXAM: PORTABLE CHEST 1 VIEW COMPARISON:  October 27, 2015 FINDINGS: The cardiomediastinal silhouette is unchanged in contour.Tortuous thoracic aorta no pleural effusion. No pneumothorax. Reticulonodular opacities along the RIGHT heart border. LEFT basilar linear opacity. Visualized abdomen is unremarkable. No acute osseous abnormality. IMPRESSION: Reticulonodular opacities along the RIGHT heart border could reflect underlying atelectasis, aspiration or infection. Electronically Signed   By: Meda Klinefelter MD   On: 06/14/2020 15:56    Procedures Procedures   Medications Ordered in ED Medications  acetaminophen (TYLENOL) tablet 650 mg (650 mg Oral Given 06/14/20 1547)  tetracaine (PONTOCAINE) 0.5 % ophthalmic solution 2 drop (2 drops Left Eye Given 06/15/20 0553)  sodium chloride 0.9 % bolus 1,000 mL (1,000 mLs Intravenous New Bag/Given 06/15/20 0558)  ondansetron (ZOFRAN) injection 4 mg (4 mg Intravenous Given 06/15/20 0553)    ED Course  I have reviewed the triage vital signs and the nursing notes.  Pertinent labs & imaging results that were available during my care of the patient were reviewed by me and considered in my medical decision making (see chart for  details).    MDM Rules/Calculators/A&P                          Patient presents with several complaints.  Overall nontoxic-appearing but does appear ill.  Febrile upon arrival and tachycardic.  Also tachypneic mid 30s to 40s.  He has rales on exam.  He is mostly reporting vomiting and myalgias at this time.  Also has a cough.  Constellation of symptoms most suspicious for viral etiology including but not limited to COVID-19 or influenza.  He is vaccinated.  Given his cough, chest x-ray obtained.  He has a reticular nodular opacity in the right heart border.  Unclear etiology.  He does have a leukocytosis.  He is not hypotensive or hypoxic.  No evidence of sepsis at this time.  COVID-19 testing is negative.  Influenza testing was added.  CMP with mild hyponatremia and elevated creatinine likely reflective of some mild dehydration.  Patient was given fluids.  Regarding his vision loss, he has near complete vision loss in the left eye.  Pressures are normal.  This happened following a headache several months ago.  Will obtain screening head CT.  Additionally, will obtain CT chest given his respiratory findings to rule out PE but also to better characterize his opacities.  Final Clinical Impression(s) / ED Diagnoses Final diagnoses:  Flu-like symptoms  Dehydration  Monocular vision loss    Rx / DC Orders ED Discharge Orders    None       Mikyah Alamo, Mayer Masker, MD 06/15/20 [phone]

## 2020-06-16 ENCOUNTER — Encounter (HOSPITAL_COMMUNITY): Payer: Self-pay | Admitting: Internal Medicine

## 2020-06-16 ENCOUNTER — Inpatient Hospital Stay (HOSPITAL_COMMUNITY): Payer: Medicaid Other

## 2020-06-16 DIAGNOSIS — H469 Unspecified optic neuritis: Secondary | ICD-10-CM | POA: Insufficient documentation

## 2020-06-16 DIAGNOSIS — D649 Anemia, unspecified: Secondary | ICD-10-CM

## 2020-06-16 DIAGNOSIS — E871 Hypo-osmolality and hyponatremia: Secondary | ICD-10-CM | POA: Diagnosis not present

## 2020-06-16 DIAGNOSIS — J189 Pneumonia, unspecified organism: Secondary | ICD-10-CM

## 2020-06-16 DIAGNOSIS — J984 Other disorders of lung: Secondary | ICD-10-CM | POA: Diagnosis not present

## 2020-06-16 LAB — BASIC METABOLIC PANEL
Anion gap: 14 (ref 5–15)
BUN: 17 mg/dL (ref 8–23)
CO2: 25 mmol/L (ref 22–32)
Calcium: 8.6 mg/dL — ABNORMAL LOW (ref 8.9–10.3)
Chloride: 92 mmol/L — ABNORMAL LOW (ref 98–111)
Creatinine, Ser: 1.3 mg/dL — ABNORMAL HIGH (ref 0.61–1.24)
GFR, Estimated: >60 mL/min (ref >60)
Glucose, Bld: 95 mg/dL (ref 70–99)
Potassium: 3.5 mmol/L (ref 3.5–5.1)
Sodium: 131 mmol/L — ABNORMAL LOW (ref 135–145)

## 2020-06-16 LAB — CBC
HCT: 26.9 % — ABNORMAL LOW (ref 39.0–52.0)
Hemoglobin: 8.8 g/dL — ABNORMAL LOW (ref 13.0–17.0)
MCH: 32.4 pg (ref 26.0–34.0)
MCHC: 32.7 g/dL (ref 30.0–36.0)
MCV: 98.9 fL (ref 80.0–100.0)
Platelets: 473 10*3/uL — ABNORMAL HIGH (ref 150–400)
RBC: 2.72 MIL/uL — ABNORMAL LOW (ref 4.22–5.81)
RDW: 14 % (ref 11.5–15.5)
WBC: 19.4 10*3/uL — ABNORMAL HIGH (ref 4.0–10.5)
nRBC: 0.1 % (ref 0.0–0.2)

## 2020-06-16 LAB — MRSA PCR SCREENING: MRSA by PCR: NEGATIVE

## 2020-06-16 MED ORDER — VANCOMYCIN HCL 2000 MG/400ML IV SOLN
2000.0000 mg | Freq: Once | INTRAVENOUS | Status: AC
  Administered 2020-06-16: 2000 mg via INTRAVENOUS
  Filled 2020-06-16: qty 400

## 2020-06-16 MED ORDER — GADOBUTROL 1 MMOL/ML IV SOLN
8.0000 mL | Freq: Once | INTRAVENOUS | Status: AC | PRN
  Administered 2020-06-16: 8 mL via INTRAVENOUS

## 2020-06-16 MED ORDER — ASPIRIN EC 81 MG PO TBEC
81.0000 mg | DELAYED_RELEASE_TABLET | Freq: Every day | ORAL | Status: DC
  Administered 2020-06-17 – 2020-06-22 (×6): 81 mg via ORAL
  Filled 2020-06-16 (×6): qty 1

## 2020-06-16 MED ORDER — VANCOMYCIN HCL IN DEXTROSE 1-5 GM/200ML-% IV SOLN
1000.0000 mg | Freq: Two times a day (BID) | INTRAVENOUS | Status: DC
  Administered 2020-06-17: 1000 mg via INTRAVENOUS
  Filled 2020-06-16 (×2): qty 200

## 2020-06-16 NOTE — ED Notes (Signed)
Pt placed on 2L [place] for comfort while resting.

## 2020-06-16 NOTE — ED Notes (Signed)
Mother updated per patient's request.

## 2020-06-16 NOTE — ED Notes (Signed)
Tele Breakfast order placed 

## 2020-06-16 NOTE — ED Notes (Signed)
Pt was able to ambulate to the restroom with no assistance.  

## 2020-06-16 NOTE — Progress Notes (Addendum)
Subjective:  Patient reports doing better than the day prior. Cough has improved. Endorses he was previously incarcerated years ago. Some time spent in shelter in the distant past. Per patient, neg TB screens with previous employment.   Objective:  Vital signs in last 24 hours: Vitals:   06/16/20 1000 06/16/20 1130 06/16/20 1208 06/16/20 1230  BP: 115/65 135/64 126/69 (!) 103/41  Pulse: (!) 128 83 87 100  Resp: $Remo'18 18 20 'XHuAH$ (!) 23  Temp:   (!) 102.3 F (39.1 C)   TempSrc:   Oral   SpO2: 91% 92% 96% 97%   Weight change:  No intake or output data in the 24 hours ending 06/16/20 1240   Physical Exam Constitutional:      General: He is not in acute distress.    Appearance: He is not toxic-appearing or diaphoretic.     Comments: Patient laying comfortably in bed, NAD. Increased latency to intermittent cough as compared to prior exam  Eyes:     Comments: Disconjugate gaze. No pupillary response in left eye. Patient reports able to make out general shapes and colors through, but unable to perceive light through left eye  Cardiovascular:     Rate and Rhythm: Normal rate and regular rhythm.  Pulmonary:     Effort: Pulmonary effort is normal.     Breath sounds: Examination of the right-lower field reveals rales. Rales present.  Neurological:     Mental Status: He is alert.   Dysconjugate gaze; left eye is unable to adduct when looking straight or to the right. Pupil is fixed and non reactive. Right eye with normal EOM and reactive pupil. Unable to count fingers on the left, no light perception but reports he is able to see general shapes.    Assessment/Plan:  Active Problems:     Cavitary RLL Pneumonia Patient presenting with two weeks of productive cough, tactile fevers, and chills. Labs significant for leukocytosis with left shift. Mild to moderate alcohol consumption per patient report. Potential aspiration component in light of alcohol use and dentition. Hyponatremic to 129 in  the setting of pneumonia, and notable emesis PTA. Patient febrile to 102.1, tachypnic in 20-30s, though maintaining proper oxygenation.  Normal lactic acid reassuring. Contrasted CTA showed RLL pneumonia with consolidation and potential early cavitation, concern for necrotizing pneumonia; and reactive lymph nodes; no pleural effusion. Quantiferon tubes are out of stock. Patient with distant history of incarceration and shelter residence.    -F/u nasal MRSA PCR -F/u blood cultures -F/u acid fast smear & culture x 3 for TB rule out  -Airborne precautions -Started Vancomycin per pharmacy -Continue Zosyn per pharmacy -Robitussin PRN -Tylenol PRN -Trend CBC  Optic Neuritis Complete Vision loss, OS Left Cranial Nerve III palsy  MRI with evidence of optic neuritis involving the entire left optic nerve as well as questions of possible demyelinating, infectious, or inflammatory sequela within the cerebral white matter, basal ganglia, and pons. On exam his left pupil is fixed and non reactive. Also with dyscongugate gaze and left cranial nerve III palsy.  -Ophthalmology consulted, f/u recs -ESR & CRP ordered initially due to concern for GCA, results may be difficult to interpret in light of acute infection -Will consult neurology as well given new MRI findings; may need to consider LP given fevers and concern for possible pulmonary TB.   Hyponatremia: Mild, but downtrending  -Follow up urine osm and urine sodium   Normocytic Anemia. No recent labs to establish baseline. Normal colonoscopy in 2015. No  evidence of active bleeding however hgb 10>8.9 since admission. Has remained stable at 8.8 today.   -trend hgb -GI consult if hgb continues to trend down -continue to monitor -will need outpatient follow up   HTN. Hold home lisinopril at this time.    Microscopic hematuria. Recommend repeat UA in 4-6w.   Alcohol use -CIWA protocol   LOS: 1 day   Azell Der, Medical  Student 06/16/2020, 12:40 PM

## 2020-06-16 NOTE — Consult Note (Addendum)
Neurology Consultation  Reason for Consult: left eye vision loss Referring Physician: Dr. Antony Contras  CC: Left eye vision loss since October  History is obtained from: patient, EDP, chart review  HPI: [name] [name] is a 65 y.o. male with a medical history significant for hypertension, tobacco and chronic alcohol use, who presented to the ED today for complaints of fever, cough, and diarrhea for 5-6 days and was subsequently diagnosed with a bacterial pneumonia. He also endorses left eye vision loss since October. He claims that he received his first COVID vaccination in October and less than one week later, he experienced a left-sided headache with left eye pain and acute onset of blurry vision. His vision progressed in one day to where he can only perceive blurry, light, dark, and large shapes and this has not improved since the initial impairment, although the headache resolved after about one week. He denies pain with eye movement currently or initially during onset of his symptoms of visual impairment.   ROS: A 14 point ROS was performed and is negative except as noted in the HPI.  Past Medical History:  Diagnosis Date  . Arthritis    right hip  . Disc displacement, lumbar   . Gout   . Hypertension    Takes medications daily   Family History  Problem Relation Age of Onset  . Hypertension Mother    Past Surgical History:  Procedure Laterality Date  . BACK SURGERY  1980s   x2:2013one back surgery  . COLONOSCOPY WITH PROPOFOL N/A 09/18/2013   Procedure: COLONOSCOPY WITH PROPOFOL;  Surgeon: Charolett Bumpers, MD;  Location: [facility];  Service: Endoscopy;  Laterality: N/A;   Social History:   reports that he has been smoking cigarettes. He has a 10.50 pack-year smoking history. He has never used smokeless tobacco. He reports current alcohol use. He reports current drug use. Drugs: Marijuana and Cocaine.  Medications Current Outpatient Medications  Medication Instructions  .  lisinopril (ZESTRIL) 40 mg, Oral, Daily   Exam: Current vital signs: BP 104/65   Pulse 79   Temp (!) 101.8 F (38.8 C)   Resp 16   Ht 6\' 1"  (1.854 m)   Wt 88.9 kg   SpO2 94%   BMI 25.86 kg/m  Vital signs in last 24 hours: Temp:  [98.9 F (37.2 C)-102.3 F (39.1 C)] 101.8 F (38.8 C) (02/07 1727) Pulse Rate:  [73-128] 79 (02/07 1600) Resp:  [13-41] 16 (02/07 1600) BP: (101-141)/(41-75) 104/65 (02/07 1600) SpO2:  [91 %-100 %] 94 % (02/07 1600) Weight:  [88.9 kg] 88.9 kg (02/07 1400)  GENERAL: Awake, laying in bed with frequent wet cough. Generally uncomfortable given recent illness. HEAD: Normocephalic and atraumatic, EENT: dry mm, no OP obstruction LUNGS -  Increased respiratory rate with congested croupy cough frequently CV - Regular rate on cardiac monitor ABDOMEN - Soft, nontender Ext: warm, well perfused  NEURO:  Mental Status: alert and oriented to person, place, time, and situation. Patient is able to give a clear and coherent history of present illness and chronic vision impairment. Speech is intact without aphasia or dysarthria. Naming and comprehension intact. Cranial Nerves:  II: Left pupil 4 mm and sluggishly reactive to light with RAPD, right pupil 3 mm/brisk. Visual acuity limited to gross perception of large shapes with impaired color and form perception - he cannot count fingers or track a moving object with the left eye, but can perceive the form of a body with a head and face when asked  to describe the examiner. Visual fields full in right eye. Difficult to visualize fundi with fundoscope bilaterally; no gross abnormality, but optic disc pallor cannot be excluded.  III, IV, VI: EOMI; left eye intermittently lags behind right eye when looking medially. No ptosis noted, lid elevation full and symmetric. V: Sensation is intact to light touch and symmetrical to face.  VII: Face is symmetrical resting and smiling. Able to puff cheeks and raise eyebrows.  VIII:  Hearing intact to voice IX, X: Phonation normal.  XI: Normal sternocleidomastoid and trapezius muscle strength XII: Tongue protrudes without fasciculations.   Motor: 5/5 strength in the deltoids and left upper and lower extremities, 4/5 strength in the right triceps and biceps.  All extremities without pronator drift. Fine motor movement intact. Tone is normal. Bulk is normal.  Sensation- Intact and symmetric to light touch bilaterally in all four extremities. No extinction to DSS. Coordination: FTN intact bilaterally. HKS intact bilaterally.  DTRs: 1+ on bilateral biceps/triceps, 2+ left patellar, 1+ right patellar reflex Gait- Deferred  Labs I have reviewed labs in epic and the results pertinent to this consultation are: CBC    Component Value Date/Time   WBC 19.4 (H) 06/16/2020 0352   RBC 2.72 (L) 06/16/2020 0352   HGB 8.8 (L) 06/16/2020 0352   HCT 26.9 (L) 06/16/2020 0352   PLT 473 (H) 06/16/2020 0352   MCV 98.9 06/16/2020 0352   MCH 32.4 06/16/2020 0352   MCHC 32.7 06/16/2020 0352   RDW 14.0 06/16/2020 0352   LYMPHSABS 1.1 06/14/2020 1558   MONOABS 1.5 (H) 06/14/2020 1558   EOSABS 0.0 06/14/2020 1558   BASOSABS 0.0 06/14/2020 1558   CMP     Component Value Date/Time   NA 131 (L) 06/16/2020 0352   K 3.5 06/16/2020 0352   CL 92 (L) 06/16/2020 0352   CO2 25 06/16/2020 0352   GLUCOSE 95 06/16/2020 0352   BUN 17 06/16/2020 0352   CREATININE 1.30 (H) 06/16/2020 0352   CREATININE 1.48 (H) 02/12/2014 1004   CALCIUM 8.6 (L) 06/16/2020 0352   PROT 7.6 06/14/2020 1558   ALBUMIN 2.5 (L) 06/14/2020 1558   AST 31 06/14/2020 1558   ALT 26 06/14/2020 1558   ALKPHOS 69 06/14/2020 1558   BILITOT 1.0 06/14/2020 1558   GFRNONAA >60 06/16/2020 0352   GFRNONAA 53 (L) 02/12/2014 1004   GFRAA >60 10/27/2015 0845   GFRAA 61 02/12/2014 1004   Lipid Panel     Component Value Date/Time   CHOL 113 07/02/2013 0929   TRIG 154 (H) 07/02/2013 0929   HDL 33 (L) 07/02/2013 0929   CHOLHDL  3.4 07/02/2013 0929   VLDL 31 07/02/2013 0929   LDLCALC 49 07/02/2013 0929   Imaging I have reviewed the images obtained:  MRI examination of the brain IMPRESSION: MRI brain: 1. No evidence of acute intracranial abnormality. 2. Small foci of chronic cortical encephalomalacia within the left parietal and temporal operculum, likely reflecting remote infarcts. 3. Mild-to-moderate multifocal T2/FLAIR hyperintensity within the cerebral white matter, basal ganglia and pons. Findings are nonspecific but likely at least partially reflect chronic small vessel ischemic disease. Sequela of a second superimposed process (i.e. demyelinating, infectious/inflammatory) cannot be excluded. 4. Chronic pontine lacunar infarcts. 5. Mild generalized atrophy of the brain.  MRI orbits: 1. Motion degraded exam. 2. T2 hyperintense signal abnormality involving the majority of the left optic nerve with associated abnormal enhancement. Findings likely reflect sequela of optic neuritis/neuropathy (of indeterminate etiology). While the enhancement likely indicates ongoing  active inflammation/demyelination, portions of the left optic nerve are atrophic and this would suggest somewhat prolonged involvement.  Assessment: 65 year old male with acute respiratory illness who complained of left eye vision loss with acute onset in October that has persisted.  - Of note, the vision loss manifested less than one week following his first COVID vaccination - MRI orbits revealed hyperintense signal abnormality of the left optic nerve with abnormal enhancement concerning for optic neuritis. Will need IV steroid treatment once stabilized from infectious standpoint since vision loss is more chronic in nature; present since October 2021. - MRI brain with multiple old infarcts; patient counseled about smoking cessation and preventive measures.  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 Ddx includes  isolated optic neuritis with NMO or MS being less likely. He does have mild RUE weakness, but this may be secondary to the pontine lacunar infarcs seen on MRI. Unusual for optic neuritis is the lack of pain with eye movement. Left optic nerve enhancement is consistent with optic neuritis.   Recommendations: - Pulse-dose steroid treatment x 5 days is recommended if risk of steroid treatment exacerbating his pneumonia due to immune suppression is felt to be relatively low by primary team. Otherwise, will need to start IV steroids after treatment course of his PNA is completed or well underway with clinical response.May need to discuss risk vs. benefit of IV steroids with ID due to bacterial pneumonia and leukocytosis on IV antibiotic treatment.  - When IV steroids can be started, would prescribe Solumedrol 1000 mg qd x 5 days - SSI with steroid treatment  - Protonix IV with administration of steroids - Evaluate inpatient for response to steroid treatment - Neurology will follow with patient  - ASA 81 mg daily for stroke prophylaxis given the chronic pontine lacunar infarctions and cortical infarctions seen on MRI brain.   Lanae Boast, AGAC-NP [facility] Pager: [phone]  I have seen and examined the patient. I have formulated the assessment and recommendations. My exam findings were observed and documented by Lanae Boast, NP.  Electronically signed: Dr. Caryl Pina

## 2020-06-16 NOTE — Progress Notes (Signed)
Pharmacy Antibiotic Note  [name] [name] is a 65 y.o. male admitted on 06/14/2020 with pneumonia.  Patient has been experiencing two weeks of productive cough, fevers, and chills. CTA showed RLL pneumonia w/ consolidation, indicative of necrotizing pneumonia. Was started on Zosyn 2/6. Pharmacy has been consulted for vancomycin dosing 2/7.  WBC 19.4 (gradual increase); lactate 1.1, Tm 102; patient reported weight at ~196lb.  Plan: Vancomycin 2000mg  IV x1, followed by Vancomycin 1000mg  IV q12h Zosyn 3.375g IV q8h F/u cultures, vanc levels as indicated   Height: 6\' 1"  (185.4 cm) Weight: 88.9 kg (196 lb) IBW/kg (Calculated) : 79.9  Temp (24hrs), Avg:100 F (37.8 C), Min:98.8 F (37.1 C), Max:102.3 F (39.1 C)  Recent Labs  Lab 06/14/20 1558 06/15/20 1352 06/16/20 0352  WBC 16.8* 17.9* 19.4*  CREATININE 1.28* 1.28* 1.30*  LATICACIDVEN 1.1  --   --     Estimated Creatinine Clearance: 66.6 mL/min (A) (by C-G formula based on SCr of 1.3 mg/dL (H)).    Allergies  Allergen Reactions  . Hctz [Hydrochlorothiazide]     Erectile dysfunction    Antimicrobials this admission: Zosyn 2/6 >>  Vancomycin 2/7 >>    Microbiology results: 2/6 BCx: ngtd  Thank you for allowing pharmacy to be a part of this patient's care.  08/13/20, PharmD PGY-1 Acute Care Pharmacy Resident Office: [phone] 06/16/2020 3:00 PM

## 2020-06-16 NOTE — ED Notes (Signed)
Notified Dr. Lysbeth Galas that Quantiferon-TB Gold Plus tubes are out of stock.

## 2020-06-16 NOTE — ED Notes (Signed)
Pt transported to MRI at this time 

## 2020-06-17 ENCOUNTER — Inpatient Hospital Stay (HOSPITAL_COMMUNITY): Payer: Medicaid Other

## 2020-06-17 DIAGNOSIS — E871 Hypo-osmolality and hyponatremia: Secondary | ICD-10-CM | POA: Diagnosis not present

## 2020-06-17 DIAGNOSIS — I10 Essential (primary) hypertension: Secondary | ICD-10-CM

## 2020-06-17 DIAGNOSIS — J984 Other disorders of lung: Secondary | ICD-10-CM | POA: Diagnosis not present

## 2020-06-17 DIAGNOSIS — J189 Pneumonia, unspecified organism: Secondary | ICD-10-CM | POA: Diagnosis not present

## 2020-06-17 DIAGNOSIS — H469 Unspecified optic neuritis: Secondary | ICD-10-CM | POA: Diagnosis not present

## 2020-06-17 LAB — CBC
HCT: 25.4 % — ABNORMAL LOW (ref 39.0–52.0)
Hemoglobin: 8.4 g/dL — ABNORMAL LOW (ref 13.0–17.0)
MCH: 32.4 pg (ref 26.0–34.0)
MCHC: 33.1 g/dL (ref 30.0–36.0)
MCV: 98.1 fL (ref 80.0–100.0)
Platelets: 396 10*3/uL (ref 150–400)
RBC: 2.59 MIL/uL — ABNORMAL LOW (ref 4.22–5.81)
RDW: 13.8 % (ref 11.5–15.5)
WBC: 15.7 10*3/uL — ABNORMAL HIGH (ref 4.0–10.5)
nRBC: 0.2 % (ref 0.0–0.2)

## 2020-06-17 LAB — VITAMIN B12: Vitamin B-12: 431 pg/mL (ref 180–914)

## 2020-06-17 LAB — BASIC METABOLIC PANEL
Anion gap: 13 (ref 5–15)
BUN: 15 mg/dL (ref 8–23)
CO2: 24 mmol/L (ref 22–32)
Calcium: 8.2 mg/dL — ABNORMAL LOW (ref 8.9–10.3)
Chloride: 95 mmol/L — ABNORMAL LOW (ref 98–111)
Creatinine, Ser: 1.14 mg/dL (ref 0.61–1.24)
GFR, Estimated: >60 mL/min (ref >60)
Glucose, Bld: 114 mg/dL — ABNORMAL HIGH (ref 70–99)
Potassium: 3.7 mmol/L (ref 3.5–5.1)
Sodium: 132 mmol/L — ABNORMAL LOW (ref 135–145)

## 2020-06-17 LAB — SEDIMENTATION RATE: Sed Rate: >140 mm/hr — ABNORMAL HIGH (ref 0–16)

## 2020-06-17 LAB — CSF CELL COUNT WITH DIFFERENTIAL
RBC Count, CSF: 0 /mm3
Tube #: 1
WBC, CSF: 2 /mm3 (ref 0–5)

## 2020-06-17 LAB — PROTEIN, CSF: Total  Protein, CSF: 80 mg/dL — ABNORMAL HIGH (ref 15–45)

## 2020-06-17 LAB — IRON AND TIBC
Iron: 16 ug/dL — ABNORMAL LOW (ref 45–182)
Saturation Ratios: 8 % — ABNORMAL LOW (ref 17.9–39.5)
TIBC: 207 ug/dL — ABNORMAL LOW (ref 250–450)
UIBC: 191 ug/dL

## 2020-06-17 LAB — GLUCOSE, CSF: Glucose, CSF: 56 mg/dL (ref 40–70)

## 2020-06-17 LAB — FERRITIN: Ferritin: 856 ng/mL — ABNORMAL HIGH (ref 24–336)

## 2020-06-17 LAB — OSMOLALITY: Osmolality: 280 mOsm/kg (ref 275–295)

## 2020-06-17 LAB — CRYPTOCOCCAL ANTIGEN, CSF: Crypto Ag: NEGATIVE

## 2020-06-17 LAB — HEPATITIS B CORE ANTIBODY, TOTAL: Hep B Core Total Ab: REACTIVE — AB

## 2020-06-17 LAB — RPR: RPR Ser Ql: NONREACTIVE

## 2020-06-17 LAB — FOLATE: Folate: 17.6 ng/mL (ref >5.9)

## 2020-06-17 LAB — HEPATITIS B SURFACE ANTIGEN: Hepatitis B Surface Ag: NONREACTIVE

## 2020-06-17 LAB — C-REACTIVE PROTEIN: CRP: 25.7 mg/dL — ABNORMAL HIGH (ref <1.0)

## 2020-06-17 MED ORDER — LIDOCAINE HCL (PF) 1 % IJ SOLN
5.0000 mL | Freq: Once | INTRAMUSCULAR | Status: AC
  Administered 2020-06-17: 3 mL

## 2020-06-17 MED ORDER — TUBERCULIN PPD 5 UNIT/0.1ML ID SOLN
5.0000 [IU] | Freq: Once | INTRADERMAL | Status: AC
  Administered 2020-06-17: 5 [IU] via INTRADERMAL
  Filled 2020-06-17: qty 0.1

## 2020-06-17 MED ORDER — SODIUM CHLORIDE 0.9 % IV SOLN: 2.0000 g | Freq: Three times a day (TID) | INTRAVENOUS | Status: DC

## 2020-06-17 MED ORDER — SODIUM CHLORIDE 0.9 % IV SOLN
2.0000 g | Freq: Three times a day (TID) | INTRAVENOUS | Status: DC
  Administered 2020-06-17 – 2020-06-18 (×3): 2 g via INTRAVENOUS
  Filled 2020-06-17 (×3): qty 2

## 2020-06-17 NOTE — Progress Notes (Addendum)
Neurology Progress Note  Subjective: No acute overnight events. Patient evaluated at bedside this morning, no family present in the room. He denies any left eye pain but complains of mild headache on left side, radiating to the neck.  States he would not require any medication for this headache as he can just sleep it off.  Still complains of blurry vision in left eye. Of note: Patient had strabismus surgery when he was 12 and his disconjugate gaze is not new - he has had it most of his life.     Objective:  Current vital signs: BP 122/63 (BP Location: Left Arm)   Pulse 82   Temp 99.1 F (37.3 C) (Oral)   Resp 18   Ht 6\' 1"  (1.854 m)   Wt 88.9 kg   SpO2 94%   BMI 25.86 kg/m  Vital signs in last 24 hours: Temp:  [99.1 F (37.3 C)-101.8 F (38.8 C)] 99.1 F (37.3 C) (02/08 1200) Pulse Rate:  [73-96] 82 (02/08 1200) Resp:  [16-33] 18 (02/08 1200) BP: (97-139)/(45-76) 122/63 (02/08 1200) SpO2:  [94 %-100 %] 94 % (02/08 1200) Weight:  [88.9 kg] 88.9 kg (02/07 1400)  GENERAL: Awake, alert, lying comfortably in bed, NAD HEENT: Normocephalic and atraumatic, dry mm LUNGS: Normal respiratory effort but congested croupy cough frequently. CV: Regular rate and rhythm. ABDOMEN: Soft, nontender Ext: warm   NEURO:  Mental Status: Alert, awake, oriented to time, place and person.  Patient is able to give clear history but notes to have poor memory. Speech/Language: speech is normal.  Naming, repetition, fluency, and comprehension intact. Cranial Nerves:  II: PERRL.  Chronic disconjugate gaze after strabismus surgery.  Visual acuity limited to gross perception of light and shapes with impaired color and higher acuity form perception.  Unable to count fingers or track moving objects with left eye unless hand is held close to face (about 6 inches). He is able to see form of body in color when examiner stood closely near him.  Visual fields full in right eye. III, IV, VI: EOMI. left eye  intermittently lags behind right eye when looking medially.   V: Sensation is intact to light touch and symmetrical to face.  VII: Smile is symmetrical. Able to puff cheeks and raise eyebrows.  VIII: hearing intact to voice. IX, X: Palate elevates symmetrically. Phonation is normal.  XI: Shoulder shrug 5/5. XII: tongue is midline without fasciculations. Motor: 4/5 strength in the right triceps and biceps. Tone: is normal and bulk is normal Sensation- Intact to light touch bilaterally  Coordination: FTN intact bilaterally, HKS: no ataxia in BLE.No drift.  DTRs: 1+ on bilateral biceps triceps, 2+ left patella, 1+ right patellar reflex Gait- Deferred   Medications  Current Facility-Administered Medications:  .  acetaminophen (TYLENOL) tablet 650 mg, 650 mg, Oral, Q6H PRN, 650 mg at 06/16/20 1216 **OR** acetaminophen (TYLENOL) suppository 650 mg, 650 mg, Rectal, Q6H PRN, Christian, Rylee, MD .  aspirin EC tablet 81 mg, 81 mg, Oral, Daily, 08/14/20, MD, 81 mg at 06/17/20 1032 .  folic acid (FOLVITE) tablet 1 mg, 1 mg, Oral, Daily, Christian, Rylee, MD, 1 mg at 06/17/20 1032 .  guaiFENesin-dextromethorphan (ROBITUSSIN DM) 100-10 MG/5ML syrup 5 mL, 5 mL, Oral, Q4H PRN, Christian, Rylee, MD, 5 mL at 06/16/20 1750 .  multivitamin with minerals tablet 1 tablet, 1 tablet, Oral, Daily, Christian, Rylee, MD, 1 tablet at 06/17/20 1032 .  piperacillin-tazobactam (ZOSYN) IVPB 3.375 g, 3.375 g, Intravenous, Q8H, Christian, Rylee, MD, Last  Rate: 12.5 mL/hr at 06/17/20 1031, 3.375 g at 06/17/20 1031 .  thiamine tablet 100 mg, 100 mg, Oral, Daily, 100 mg at 06/17/20 1032 **OR** thiamine (B-1) injection 100 mg, 100 mg, Intravenous, Daily, Ephriam Knuckles, Rylee, MD .  tuberculin injection 5 Units, 5 Units, Intradermal, Once, Caryl Pina, MD, 5 Units at 06/17/20 1218  Labs CBC    Component Value Date/Time   WBC 15.7 (H) 06/17/2020 0453   RBC 2.59 (L) 06/17/2020 0453   HGB 8.4 (L) 06/17/2020 0453    HCT 25.4 (L) 06/17/2020 0453   PLT 396 06/17/2020 0453   MCV 98.1 06/17/2020 0453   MCH 32.4 06/17/2020 0453   MCHC 33.1 06/17/2020 0453   RDW 13.8 06/17/2020 0453   LYMPHSABS 1.1 06/14/2020 1558   MONOABS 1.5 (H) 06/14/2020 1558   EOSABS 0.0 06/14/2020 1558   BASOSABS 0.0 06/14/2020 1558    CMP     Component Value Date/Time   NA 132 (L) 06/17/2020 0453   K 3.7 06/17/2020 0453   CL 95 (L) 06/17/2020 0453   CO2 24 06/17/2020 0453   GLUCOSE 114 (H) 06/17/2020 0453   BUN 15 06/17/2020 0453   CREATININE 1.14 06/17/2020 0453   CREATININE 1.48 (H) 02/12/2014 1004   CALCIUM 8.2 (L) 06/17/2020 0453   PROT 7.6 06/14/2020 1558   ALBUMIN 2.5 (L) 06/14/2020 1558   AST 31 06/14/2020 1558   ALT 26 06/14/2020 1558   ALKPHOS 69 06/14/2020 1558   BILITOT 1.0 06/14/2020 1558   GFRNONAA >60 06/17/2020 0453   GFRNONAA 53 (L) 02/12/2014 1004   GFRAA >60 10/27/2015 0845   GFRAA 61 02/12/2014 1004     Lipid Panel     Component Value Date/Time   CHOL 113 07/02/2013 0929   TRIG 154 (H) 07/02/2013 0929   HDL 33 (L) 07/02/2013 0929   CHOLHDL 3.4 07/02/2013 0929   VLDL 31 07/02/2013 0929   LDLCALC 49 07/02/2013 0929     Assessment:  65 year old male with acute respiratory illness who complained of left eye vision loss with acute onset in October that has persisted. -Of note, the vision loss manifested less than 1 week following the first COVID vaccination. -MRI orbits revealed hyperintense signal abnormality of the left optic nerve with normal enhancement concerning for optic neuritis. Will need IV steroid treatment once stabilized from infectious standpoint since vision loss is more chronic in nature; present since October 2021. -MRI brain with a small number of old infarcts; patient counseled about smoking cessation and preventive measures. - Left optic nerve enhancement is consistent with optic neuritis. However, it is unusual to have a lack of pain with eye movement - the patient  endorses having had no eye pain with movement at the time of symptom onset and afterwards. 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 DDx includes isolated optic neuritis with an NMO or MS being less likely. Given his pneumonia with possible cavitation seen on CT chest, TB is also on the differential diagnosis, both for the PNA and as a possible etiology for his left optic neuritis (Tuberculous optic neuropathy). -Of note, his left ocular motility deficit is old. It has been present since childhood. -He does have mild right upper extremity weakness, but this may be secondary to the pontine lacunar infarct seen on MRI.  Recommendations:  Left optic neuritis        -We will continue to hold off on pulsed-dose steroids while his RLL cavitary pneumonia is being empirically treated as it is  also being worked up for the underlying infectious etiology.       -We will need a fluoroscopically-guided LP. Cannot safely perform at bedside due to prior lumbar procedure*3. The patient is unsure of the type of spinal instrumentation, therefore lumbar spine plain films are being ordered.       -Labs or fluoroscopy guided LP have been ordered as follows: Cell count with differential, Gram stain, protein, glucose, fungal culture, bacterial culture, VDRL, cytology, cryptococcal antigen, TB PCR, AFP, IgG index  Old stroke seen on MRI         -ASA 81 mg daily for stroke prophylaxis given the chronic pontine lacunar infarction and cortical infarction seen on MRI brain.  RLL cavitary PNA        -Wide DDx including TB        -May need to consult ID        -QuantiFERON test has been ordered   Arnoldo Lenis, MD PGY-1 Resident  Electronically signed: Dr. Caryl Pina

## 2020-06-17 NOTE — Progress Notes (Addendum)
Subjective: Patient reports feeling better than the day prior. Reports improved breathing. Productive cough persists. Conveys hx of strabismus surgery ~age 65. Discussed need to collect sputum x3 for analysis. Expressed has outpatient Neurology appointment for Spinal Stenosis on 2/11.  --appointment canceled.  Objective:  Vital signs in last 24 hours: Vitals:   06/16/20 1930 06/16/20 2056 06/17/20 0506 06/17/20 1200  BP: 131/68 121/72 (!) 97/45 122/63  Pulse: 92 93 86 82  Resp: (!) $RemoveB'24 20 20 18  'bUebXInN$ Temp:  100.1 F (37.8 C) 99.7 F (37.6 C) 99.1 F (37.3 C)  TempSrc:  Oral Oral Oral  SpO2: 94% 99% 100% 94%  Weight:      Height:       Weight change:   Intake/Output Summary (Last 24 hours) at 06/17/2020 1349 Last data filed at 06/17/2020 0830 Gross per 24 hour  Intake 970.46 ml  Output --  Net 970.46 ml    Physical Exam Constitutional:      General: He is not in acute distress.    Appearance: He is not toxic-appearing or diaphoretic.     Comments: Patient seen lying in bed, NAD. Breathing comfortably on RA  Eyes:     Extraocular Movements:     Right eye: Normal extraocular motion.     Left eye: Abnormal extraocular motion present.     Comments: Chronic disconjugate gaze. Limited adduction and no pupillary response on left.   Cardiovascular:     Rate and Rhythm: Normal rate and regular rhythm.  Pulmonary:     Effort: Pulmonary effort is normal.     Breath sounds: Rales present.  Neurological:     Mental Status: He is alert.     Assessment/Plan:  Principal Problem:   Cavitary pneumonia Active Problems:   Necrotizing pneumonia ([place])   Cavitary RLL Pneumonia Patient presenting with two weeks of productive cough, tactile fevers, and chills. Labs significant for leukocytosis with left shift.Mild to moderate alcohol consumption per patient report. Potential aspiration componentin light of alcohol use and dentition.Hyponatremic to 129 in the setting of pneumonia,and  notable emesis PTA. Patient febrile to 102.1,tachypnic in 20-30s, though maintaining proper oxygenation on admission. Normal lactic acid reassuring. Contrasted CTA showedRLL pneumonia with consolidation and potential early cavitation, concern for necrotizing pneumonia; and reactive lymph nodes; no pleural effusion. Quantiferon tubes previously out of stock. Patient reporting distant history of incarceration and shelter residence. MRSA PCR negative. Blood cultures without growth to date.   -F/u Quantiferon  -F/u acid fast smear & culture x 3 for TB rule out -F/u blood cultures  -Airborne & Droplet precautions  -Stopped Vancomycin  -Continue Zosynper pharmacy -Robitussin PRN -Tylenol PRN -Trend CBC   Optic Neuritis Complete Vision loss, OS Left Cranial Nerve III palsy  Chronic pontine lacunar infarctions and cortical infarctions MRI with evidence of optic neuritis involving the entire left optic nerve as well as questions of possible demyelinating, infectious, or inflammatory sequela within the cerebral white matter, basal ganglia, and pons. On exam his left pupil is fixed and non reactive. Also with left cranial nerve III palsy, and chronic dyscongugate gaze. Patient reporting hx of strabismus surgery in childhood. ESR & CRP elevated (previously ordered out of concern for GSA), in the context of infection. 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   -F/u LP labs -Neurology following, appreciate recs:  -Fluoro guided LP today (hx of multiple past lumbar procedures)--46mL CSF obtained  -Started ASA 81 prophylaxis  -Continue to hold off on starting steroids  -Outpatient opthalmology  f/u   Hyponatremia: Mild, stable. Normal serum osmolality.  -Follow up urine sodium and urine osmolality   NormocyticAnemia No recent labs to establish baseline. Normal colonoscopy in 2015. No evidence of active bleeding however hgb 10>8.9 since admission. Remains stable at 8.4. Iron studies  consistent with anemia of inflammation (low TIBC and saturation, high ferritin). Normal B12 and folate.   -trend hgb -continue to monitor -will need outpatient follow up   HTN. Hold home lisinopril at this time.   Microscopic hematuria. Recommend repeat UA in 4-6w.   Alcohol use -CIWA protocol    LOS: 2 days   Azell Der, Medical Student 06/17/2020, 1:49 PM

## 2020-06-17 NOTE — Progress Notes (Signed)
Id chart check   65 yo african American male hx alcohol abuse, hep c, incarceration here with right lower lobe cavitary pna/sepsis in setting of 2 weeks of productive cough, progressive fatigue, and a few months of left monocular vision loss (onset after covid vaccine) with mri finding of optic neuritis and chronic white matter changes  ddx is broad Agree risk for tb and needs to be r/o; other consideration for typical lobar pna/polymicrobial bacterial lung abscess. TB monocular involvement is rare but theoretically can occur, and usually is retinitis/uveitis in presentation. Potentially (temporally atypical and optic neuritis atypical) septic embolic phenomenom with pulmonary/ophthalmic involvement is a possibility Non ID considerations for vasculitis vs other autoimmune process. We can entertain ourselves with process such as MS as wel. Neurology is on board   -I have added autoimmune screening labs, hepatitis serology, and sputum MTB/rifampin pcr as well -neurology have ordered LP with appropriate ID w/u from [facility] -I would consider ophthalmology input to evaluate for uveitis as well and also consider pulmonology input potentially for tissue dx of the lung   -the question is whether or not to initiate immunosuppressant at this time. As above mention, he has risk for TB, but we can't r/o connective tissue/autoimmune process. Thus minimally invasive tissue diagnosis potentially could be helpful   Will formally see tomorrow

## 2020-06-17 NOTE — Progress Notes (Signed)
Patient was unable to produce a third sputum sample, RN will continue to monitor this patient

## 2020-06-17 NOTE — Progress Notes (Signed)
New Admission  Arrival Method: via stretcher Mental Orientation: alert x4 Telemetry: none Assessment: Completed Skin: see flowsheet IV: left ac nsl Pain: none Tubes: none Safety Measures: Safety Fall Prevention Plan has been discussed Airborne precautions in place Admission: Completed [facility] Orientation: Patient has been orientated to the room, unit and staff.  Family: none at bedside  Orders have been reviewed and implemented. Will continue to monitor the patient. Call light has been placed within reach and bed alarm has been activated.   Artemio Aly BSN, RN Phone number: [phone]

## 2020-06-17 NOTE — Progress Notes (Signed)
Patient placed NPO per Radiology for lumbar puncture procedure toady at 1330.

## 2020-06-17 NOTE — Procedures (Signed)
Fluoroscopically-guided lumbar puncture performed at the L4 level.  6 mL of CSF could be obtained and sent for laboratory studies.   The patient tolerated the procedure well without immediate post-procedure complication.

## 2020-06-17 NOTE — Plan of Care (Signed)
  Problem: Education: Goal: Knowledge of General Education information will improve Description Including pain rating scale, medication(s)/side effects and non-pharmacologic comfort measures Outcome: Progressing   

## 2020-06-18 ENCOUNTER — Other Ambulatory Visit: Payer: Self-pay | Admitting: Ophthalmology

## 2020-06-18 DIAGNOSIS — H469 Unspecified optic neuritis: Secondary | ICD-10-CM

## 2020-06-18 DIAGNOSIS — J984 Other disorders of lung: Secondary | ICD-10-CM

## 2020-06-18 DIAGNOSIS — H5460 Unqualified visual loss, one eye, unspecified: Secondary | ICD-10-CM

## 2020-06-18 DIAGNOSIS — J850 Gangrene and necrosis of lung: Secondary | ICD-10-CM | POA: Diagnosis not present

## 2020-06-18 DIAGNOSIS — A419 Sepsis, unspecified organism: Secondary | ICD-10-CM

## 2020-06-18 DIAGNOSIS — J189 Pneumonia, unspecified organism: Secondary | ICD-10-CM | POA: Diagnosis not present

## 2020-06-18 DIAGNOSIS — E871 Hypo-osmolality and hyponatremia: Secondary | ICD-10-CM | POA: Diagnosis not present

## 2020-06-18 LAB — ACID FAST SMEAR (AFB, MYCOBACTERIA): Acid Fast Smear: NEGATIVE

## 2020-06-18 LAB — GLUCOSE, CAPILLARY
Glucose-Capillary: 217 mg/dL — ABNORMAL HIGH (ref 70–99)
Glucose-Capillary: 100 mg/dL — ABNORMAL HIGH (ref 70–99)

## 2020-06-18 LAB — BASIC METABOLIC PANEL
Anion gap: 12 (ref 5–15)
BUN: 13 mg/dL (ref 8–23)
CO2: 24 mmol/L (ref 22–32)
Calcium: 8.2 mg/dL — ABNORMAL LOW (ref 8.9–10.3)
Chloride: 98 mmol/L (ref 98–111)
Creatinine, Ser: 0.98 mg/dL (ref 0.61–1.24)
GFR, Estimated: >60 mL/min (ref >60)
Glucose, Bld: 103 mg/dL — ABNORMAL HIGH (ref 70–99)
Potassium: 3.5 mmol/L (ref 3.5–5.1)
Sodium: 134 mmol/L — ABNORMAL LOW (ref 135–145)

## 2020-06-18 LAB — ANTIEXTRACTABLE NUCLEAR AG
ENA SM Ab Ser-aCnc: <0.2 AI (ref 0.0–0.9)
Ribonucleic Protein: <0.2 AI (ref 0.0–0.9)

## 2020-06-18 LAB — ANTINUCLEAR ANTIBODIES, IFA: ANA Ab, IFA: NEGATIVE

## 2020-06-18 LAB — TYPE AND SCREEN
ABO/RH(D): A POS
Antibody Screen: NEGATIVE

## 2020-06-18 LAB — CBC
HCT: 24.4 % — ABNORMAL LOW (ref 39.0–52.0)
Hemoglobin: 7.5 g/dL — ABNORMAL LOW (ref 13.0–17.0)
MCH: 30.5 pg (ref 26.0–34.0)
MCHC: 30.7 g/dL (ref 30.0–36.0)
MCV: 99.2 fL (ref 80.0–100.0)
Platelets: 440 10*3/uL — ABNORMAL HIGH (ref 150–400)
RBC: 2.46 MIL/uL — ABNORMAL LOW (ref 4.22–5.81)
RDW: 13.5 % (ref 11.5–15.5)
WBC: 15 10*3/uL — ABNORMAL HIGH (ref 4.0–10.5)
nRBC: 0 % (ref 0.0–0.2)

## 2020-06-18 LAB — ANCA TITERS
Atypical P-ANCA titer: <1:20 {titer}
C-ANCA: <1:20 {titer}
P-ANCA: <1:20 {titer}

## 2020-06-18 LAB — HEMOGLOBIN A1C
Hgb A1c MFr Bld: 4.6 % — ABNORMAL LOW (ref 4.8–5.6)
Mean Plasma Glucose: 85.32 mg/dL

## 2020-06-18 LAB — IGG CSF INDEX
Albumin CSF-mCnc: 38 mg/dL (ref 15–55)
Albumin: 2.9 g/dL — ABNORMAL LOW (ref 3.8–4.8)
CSF IgG Index: 0.8 — ABNORMAL HIGH (ref 0.0–0.7)
IgG (Immunoglobin G), Serum: 1385 mg/dL (ref 603–1613)
IgG, CSF: 14.5 mg/dL — ABNORMAL HIGH (ref 0.0–10.3)
IgG/Alb Ratio, CSF: 0.38 — ABNORMAL HIGH (ref 0.00–0.25)

## 2020-06-18 LAB — EXPECTORATED SPUTUM ASSESSMENT W GRAM STAIN, RFLX TO RESP C

## 2020-06-18 LAB — OSMOLALITY, URINE: Osmolality, Ur: 321 mOsm/kg (ref 300–900)

## 2020-06-18 LAB — SODIUM, URINE, RANDOM: Sodium, Ur: 26 mmol/L

## 2020-06-18 LAB — CRYPTOCOCCAL ANTIGEN: Crypto Ag: NEGATIVE

## 2020-06-18 LAB — CYCLIC CITRUL PEPTIDE ANTIBODY, IGG/IGA: CCP Antibodies IgG/IgA: 5 units (ref 0–19)

## 2020-06-18 LAB — RHEUMATOID FACTOR: Rheumatoid fact SerPl-aCnc: 13.9 IU/mL (ref <14.0)

## 2020-06-18 LAB — HEPATITIS B SURFACE ANTIBODY, QUANTITATIVE: Hep B S AB Quant (Post): 566.9 m[IU]/mL (ref >9.9)

## 2020-06-18 LAB — HCV AB W REFLEX TO QUANT PCR: HCV Ab: >11 s/co ratio — ABNORMAL HIGH (ref 0.0–0.9)

## 2020-06-18 LAB — HCV RT-PCR, QUANT (NON-GRAPH): Hepatitis C Quantitation: NOT DETECTED IU/mL

## 2020-06-18 LAB — ABO/RH: ABO/RH(D): A POS

## 2020-06-18 LAB — VDRL, CSF: VDRL Quant, CSF: NONREACTIVE

## 2020-06-18 MED ORDER — INSULIN ASPART 100 UNIT/ML ~~LOC~~ SOLN: 0.0000 [IU] | Freq: Three times a day (TID) | SUBCUTANEOUS | Status: DC

## 2020-06-18 MED ORDER — AMOXICILLIN-POT CLAVULANATE 875-125 MG PO TABS
1.0000 | ORAL_TABLET | Freq: Two times a day (BID) | ORAL | Status: DC
  Administered 2020-06-18 – 2020-06-22 (×8): 1 via ORAL
  Filled 2020-06-18 (×8): qty 1

## 2020-06-18 MED ORDER — SODIUM CHLORIDE 0.9 % IV SOLN
1000.0000 mg | INTRAVENOUS | Status: DC
  Filled 2020-06-18: qty 8

## 2020-06-18 MED ORDER — PANTOPRAZOLE SODIUM 40 MG IV SOLR
40.0000 mg | INTRAVENOUS | Status: DC
  Filled 2020-06-18: qty 40

## 2020-06-18 NOTE — Progress Notes (Addendum)
Neurology Progress Note  Subjective: No acute overnight events. Patient denies any new complaints.  Objective: Current vital signs: BP 97/67 (BP Location: Left Arm)   Pulse 85   Temp 98 F (36.7 C)   Resp 18   Ht 6\' 1"  (1.854 m)   Wt 88.9 kg   SpO2 98%   BMI 25.86 kg/m  Vital signs in last 24 hours: Temp:  [98 F (36.7 C)-99.1 F (37.3 C)] 98 F (36.7 C) (02/09 0538) Pulse Rate:  [74-85] 85 (02/09 0538) Resp:  [18-20] 18 (02/09 0538) BP: (97-122)/(62-67) 97/67 (02/09 0538) SpO2:  [93 %-98 %] 98 % (02/09 0538)  GENERAL: Awake, alert, lying comfortably in bed, NAD HEENT: Normocephalic and atraumatic, dry mm LUNGS: Normal respiratory effort but congested croupy cough present. CV: Regular rate and rthym ABDOMEN: Soft, non-tender, non-didtended Extremties: no peripheral edema noted, warm.   NEUROLOGICAL: Mental Status: Alert, awake and oriented to time, place and person. Able to give clear and coherent history but also notes poor memory. Normal speech, naming, fluency and comprehension intact. Cranial Nerves:  II: PERRL.  Chronic disconjugate gaze after strabismus surgery.  Left eye visual acuity limited to gross perception of light and shapes with impaired color and higher acuity form perception.  Unable to count fingers track moving objects with left eye unless hand is held close to face(~6 inches).  Able to see form of body in color when examiner stood closely near him (~6 inches).  Visual fields full in the right eye. III, IV, VI: EOMI. Left eye intermittently lags behind right eye when looking medially V: Sensation is intact to light touch and symmetrical to face.  VII: Smile is symmetrical. Able to puff cheeks and raise eyebrows.  VIII: Hearing intact to voice. IX, X: Palate elevates symmetrically. Phonation is normal.  07-22-1975 shrug 5/5. XII: Tongue is midline without fasciculations. Motor: 4/5 strength in the right biceps and triceps. Tone: is normal and bulk is  normal. Sensation- Intact to light touch bilaterally Coordination: FTN intact bilaterally, HKS: no ataxia in BLE.No drift.  DTRs: 1+ on bilateral biceps triceps, 2+ left patella, 1+ right patellar reflex. Gait- Deferred  Medications  Current Facility-Administered Medications:  .  acetaminophen (TYLENOL) tablet 650 mg, 650 mg, Oral, Q6H PRN, 650 mg at 06/16/20 1216 **OR** acetaminophen (TYLENOL) suppository 650 mg, 650 mg, Rectal, Q6H PRN, Christian, Rylee, MD .  aspirin EC tablet 81 mg, 81 mg, Oral, Daily, 08/14/20, MD, 81 mg at 06/17/20 1032 .  ceFEPIme (MAXIPIME) 2 g in sodium chloride 0.9 % 100 mL IVPB, 2 g, Intravenous, Q8H, 08/15/20, RPH, Last Rate: 200 mL/hr at 06/18/20 0216, 2 g at 06/18/20 0216 .  folic acid (FOLVITE) tablet 1 mg, 1 mg, Oral, Daily, Christian, Rylee, MD, 1 mg at 06/17/20 1032 .  guaiFENesin-dextromethorphan (ROBITUSSIN DM) 100-10 MG/5ML syrup 5 mL, 5 mL, Oral, Q4H PRN, Christian, Rylee, MD, 5 mL at 06/16/20 1750 .  multivitamin with minerals tablet 1 tablet, 1 tablet, Oral, Daily, Christian, Rylee, MD, 1 tablet at 06/17/20 1032 .  thiamine tablet 100 mg, 100 mg, Oral, Daily, 100 mg at 06/17/20 1032 **OR** thiamine (B-1) injection 100 mg, 100 mg, Intravenous, Daily, Christian, Rylee, MD .  tuberculin injection 5 Units, 5 Units, Intradermal, Once, 08/15/20, MD, 5 Units at 06/17/20 1218  Pertinent Labs  CSF Spinal fluid  Clear appearance, glucose is 56, RBC 0, WBC 2, colorless, total protein abnormal at 80  Imaging MD has reviewed images in epic and  the results pertinent to this consultation are:  CT Head: Evidence of small vessel disease including age indeterminate involvement of deep white matter capsules and bones.  Superimposed left greater than right temporal/operculum chronic encephalomalacia might also be post ischemic.  Disconjugate gaze and chronic left lamina papyracea fracture.  MRI Brain: No evidence of acute intracranial abnormality.   Small focus of chronic cortical encephalomalacia within the left parietal and temporal operculum, likely reflecting remote infarcts.  Chronic pontine lacunar infarcts  MR Orbits: T2 hyperintense signal abnormality involving the majority of the left optic nerve with associated abnormal enhancement. Findings likely reflect sequela of optic neuritis/neuropathy (of indeterminate etiology). While the enhancement likely indicates ongoing active inflammation/demyelination, portions of the left optic nerve are atrophic and this would suggest somewhat prolonged involvement.    Assessment: 65 year old male with acute respiratory illness who complains of left eye vision loss with acute onset in October that has persisted. -The vision loss manifested less than 1 week following the first cold vaccination -Left ocular motility deficit is old, present since childhood after strabismus surgery. -MRI orbits revealed hyperintense signal abnormality of the left optic nerve with normal enhancement concerning for optic neuritis.  Will need IV steroid treatment once stabilized from infectious standpoint since vision loss is more chronic in nature; present since October 2021. -Left optic nerve enhancement is consistent with optic neuritis.  However, it is unusual to have a lack of pain with eye movement-the patient endorses having had no eye pain with movement at the time of symptom onset and afterwards. -Fluoroscopy scopic guided LP shows Clear appearance, glucose is 56, RBC 0, WBC 2, colorless, total protein elevated at 80. Overall findings are inconsistent with infection, but support an inflammatory/autoimmune etiology for the patient's left optic neuritis.  -Etiology most likely inflammatory, LP rules out infectious cause of optic neuritis.  -Agree with ID-other consideration for typical lobar PNA/ polymicrobial bacterial lung abscess.  TB monoocular involvement is rare.  -Mild right upper extremity weakness, may be  secondary to pontine lacunar infarct seen on MRI.  Recommendations:  Left optic neuritis    -Safe to start steroids, given most likely inflammatory etiology for optic neuritis, if primary and ID team agreeable.    -Pulse-does steroid (Solumedrol 1000 mg IV qd) is recommended for a total of 5 days.     -SSI with steroid treatment    -Protonix IV with administration of steroids    -Evaluate inpatient for response to steroid treatment.    -Follow CBG, BMP and CBC while on steroids.   Old stroke seen on MRI    -Continue ASA 81 mg daily for stroke prophylaxis.  RLL cavitary PNA    -ID is on board    -QuantiFERON-TB pending    -Autoimmune screening labs, hepatitis serology, MTB/rifampin PCR pending  Arnoldo Lenis, MD PGY-1 Resident  Addendum: With ID input, decision has been made to hold off on IV steroids for now given possibility of TB still being a significant consideration.   Electronically signed: Dr. Caryl Pina

## 2020-06-18 NOTE — Progress Notes (Addendum)
Subjective:  Patient reports feeling better, with improvement to cough. Expressed appreciative of workup, and regret regarding delaying seeking care. Reports some improvement to his vision. Has been propelling sputum sample into assigned container. Distant hx of shelter residence and incarceration. Now volunteers at shelter.   Objective:  Vital signs in last 24 hours: Vitals:   06/17/20 1727 06/17/20 2202 06/18/20 0538 06/18/20 0959  BP: 116/64 104/63 97/67 114/76  Pulse: 84 74 85 77  Resp: $Remo'20 18 18 18  'RTNpy$ Temp: 99 F (37.2 C) 98.4 F (36.9 C) 98 F (36.7 C) 98.3 F (36.8 C)  TempSrc: Oral   Oral  SpO2: 96% 93% 98% 100%  Weight:      Height:       Weight change:   Intake/Output Summary (Last 24 hours) at 06/18/2020 1333 Last data filed at 06/18/2020 6811 Gross per 24 hour  Intake 1170.06 ml  Output 875 ml  Net 295.06 ml    Physical Exam Constitutional:      General: He is not in acute distress.    Appearance: Normal appearance. He is not toxic-appearing or diaphoretic.     Comments: Patient seen sitting comfortably in bed. NAD. Intermittently coughing with mild-moderate intensity.   Pulmonary:     Effort: Pulmonary effort is normal. No respiratory distress.     Breath sounds: Rales present.  Neurological:     Mental Status: He is alert.      Assessment/Plan: Resolved:  -Hx of Hep B    Principal Problem:   Cavitary pneumonia Active Problems:   Necrotizing pneumonia ([place])   Monocular vision loss   Cavitary RLLPneumonia Patient presenting with two weeks of productive cough, tactile fevers, and chills. Labs significant for leukocytosis with left shift, that is downtrending.Contrasted CTA showedRLL pneumonia with consolidation and potential early cavitation,concern fornecrotizing pneumonia; and reactive lymph nodes; no pleural effusion. Patient reporting distant history of incarceration and shelter residence; and more recent hx of volunteering at shelter.MRSA  PCR negative. Blood cultures without growth to date. Discontinued Zosyn and started on cefepime overnight.  *Consulted Pulmonology for tissue pathology, f/u recs *ID recs:    -Switched to Augmentin: plan for 4 weeks -F/u Quantiferon  -Consulted RT to get ample fluid collection -F/u blood cultures -Airborne & Droplet precautions  -Robitussin PRN -Tylenol PRN -Trend CBC    Optic Neuritis Complete Vision loss, OS Left Cranial Nerve III palsy  Chronic pontine lacunar infarctions and cortical infarctions MRI with evidence of optic neuritis involving the entire left optic nerve as well as questions ofpossible demyelinating, infectious, or inflammatory sequela within the cerebral white matter, basal ganglia, and pons. On exam his left pupil is fixed and non reactive. Also with left cranial nerve III palsy, and chronic dyscongugate gaze.Patient reporting hx of strabismus surgery in childhood. ESR & CRPelevated (previously ordered out of concern for GSA), in the context of infection. 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 6 mL CSF obtained. CSF analysis showed normal glucose of 56, and protein elevation to 80; with no organisms detected. Does not rule out TB involvement.    *Consulted Ophthalmology, f/u recs  *ID following, appreciate recs -Hold off on starting steroids -f/u fungal serologies, acid fast smear & culturex 3 for TB rule out, autoimmune labs -TB meningitis/neuritis process would require 3 large volume LP with at least 7-10 mL csf for tb culture   *Neurology following, appreciate recs: -Continue ASA 81 prophylaxis -F/u LP labs   Hyponatremia:Mild, improving. Normal serum osmolality.  -Follow up urine  sodiumand urine osmolality   NormocyticAnemia No recent labs to establish baseline. Normal colonoscopy in 2015. No evidence of active bleeding however hgb 10>8.9 since admission. on studies consistent with anemia of inflammation (low TIBC and saturation,  high ferritin). Normal B12 and folate. Previously stable at 8.4; significant drop to 7.5 this AM.   -f/u evening CBC -trend hgb -Transfuse for <7 -continue to monitor    HTN. Hold home lisinopril at this time.   Microscopic hematuria. Recommend repeat UA in 4-6w.   Alcohol use -CIWA protocol    LOS: 3 days   [name] [name], Medical Student 06/18/2020, 1:33 PM

## 2020-06-18 NOTE — Consult Note (Signed)
[facility]    Date of Admission:  06/14/2020   Total days of antibiotics 4        Day 4 zosyn               Reason for Consult: Cavitary RLL pneumonia    Referring Provider: Dr. Reymundo Poll Primary Care Provider: No PCP  Assessment: Cavitary RLL pneumonia Left optic neuritis   Plan: 1. Cavitary RLL pneumonia- TB workup, autoimmune screen labs and MTB/rifampin PCR pending. Hepatitis serology negative. Prior ppds have been negative, per patient. TB needs to be r/o; other considerations typical lobar pna/polymicrobial bacterial lung abscess vs connective tissue or autoimmune process. Will also consider fungal workup.  2. Left optic neuritis- LP shows clear appearance, glucose 56, rbc 0, wbc 2, colorless and total protein 80. Cannot exclude TB, however etiology likely inflammatory/autoimmune. Neurology on board. Recommend ophthalmology input to evaluate for uveitis as well.   Principal Problem:   Cavitary pneumonia Active Problems:   Necrotizing pneumonia ([facility])   Scheduled Meds: . aspirin EC  81 mg Oral Daily  . folic acid  1 mg Oral Daily  . multivitamin with minerals  1 tablet Oral Daily  . thiamine  100 mg Oral Daily   Or  . thiamine  100 mg Intravenous Daily  . tuberculin  5 Units Intradermal Once   Continuous Infusions: . ceFEPime (MAXIPIME) IV 2 g (06/18/20 0216)   PRN Meds:.acetaminophen **OR** acetaminophen, guaiFENesin-dextromethorphan  HPI: [name] [name] is a 65 y.o. male with a PMHx of HTN, chronic alcohol use and prior incarceration in the 1980's presenting with fever and cough found to have a right lower lobar pneumonia with evidence of early cavitation on CTA chest. There is radiographic concern for necrotizing pneumonia. He has a productive cough and persistently febrile. Was started on broad spectrum antibiotics, vancomycin and zosyn, de-escalated to zosyn due to negative MRSA nasal swab. Given evidence of early cavitation  and history of remote incarceration, plan to rule out TB with serial sputum AFB and culture.   He also reports near complete vision loss in his left eye after receiving his second COVID-19 vaccine in October. Ophthalmology consulted and MRI brain and orbits showed question of a possible demyelinating, infectious, or inflammatory sequela within the cerebral white matter, basal ganglia, and pons. As well as evidence of optic neuritis involving the entire left optic nerve. Although this is chronic, there is evidence of enhancement indicating ongoing active inflammation / demyelination. Neurology was consulted.   He reports his vision feels improved today. He is able to see more out of his left. Denies any pain in his left eye. He also reports his cough has been ongoing for many months. He feels he is having trouble getting the congestion out. Denies any fevers, chills, night sweats, n/v. Endorses diarrhea but states this is improving since admission.  Review of Systems: Review of Systems  Constitutional: Positive for fever. Negative for chills and diaphoresis.  HENT: Positive for congestion and sore throat.   Eyes: Positive for blurred vision. Negative for pain, discharge and redness.  Respiratory: Positive for cough, sputum production and shortness of breath.   Cardiovascular: Negative for chest pain and leg swelling.  Gastrointestinal: Positive for diarrhea. Negative for abdominal pain, nausea and vomiting.  Skin: Negative for itching and rash.  Neurological: Negative for weakness and headaches.    Past Medical History:  Diagnosis Date  . Arthritis    right  hip  . Disc displacement, lumbar   . Gout   . Hypertension    Takes medications daily    Social History   Tobacco Use  . Smoking status: Current Every Day Smoker    Packs/day: 0.30    Years: 35.00    Pack years: 10.50    Types: Cigarettes  . Smokeless tobacco: Never Used  Substance Use Topics  . Alcohol use: Yes     Alcohol/week: 0.0 standard drinks    Comment: occasionally  . Drug use: Yes    Types: Marijuana, Cocaine    Comment: last used 3-4 days ago    Family History  Problem Relation Age of Onset  . Hypertension Mother    Allergies  Allergen Reactions  . Hctz [Hydrochlorothiazide]     Erectile dysfunction    OBJECTIVE: Blood pressure 97/67, pulse 85, temperature 98 F (36.7 C), resp. rate 18, height 6\' 1"  (1.854 m), weight 88.9 kg, SpO2 98 %.  Physical Exam Constitutional:      General: He is not in acute distress.    Appearance: Normal appearance. He is not ill-appearing.  HENT:     Head: Normocephalic and atraumatic.  Eyes:     Extraocular Movements: Extraocular movements intact.     Conjunctiva/sclera: Conjunctivae normal.     Pupils: Pupils are equal, round, and reactive to light.     Comments: Chronic disconjugate gaze after strabismus surgery.  Left eye visual acuity limited to gross perception of light and shapes.  Unable to count fingers or track objects with left eye unless very close to face.    Cardiovascular:     Rate and Rhythm: Normal rate.     Heart sounds: Normal heart sounds. No murmur heard. No friction rub. No gallop.   Pulmonary:     Effort: Pulmonary effort is normal.     Breath sounds: Rhonchi and rales present.  Abdominal:     General: Abdomen is flat. Bowel sounds are normal. There is no distension.     Palpations: Abdomen is soft.     Tenderness: There is no abdominal tenderness.  Musculoskeletal:        General: No swelling or tenderness.     Right lower leg: No edema.     Left lower leg: No edema.  Skin:    General: Skin is warm and dry.  Neurological:     Mental Status: He is alert and oriented to person, place, and time.  Psychiatric:        Mood and Affect: Mood normal.        Behavior: Behavior normal.        Thought Content: Thought content normal.        Judgment: Judgment normal.     Lab Results Lab Results  Component Value Date    WBC 15.0 (H) 06/18/2020   HGB 7.5 (L) 06/18/2020   HCT 24.4 (L) 06/18/2020   MCV 99.2 06/18/2020   PLT 440 (H) 06/18/2020    Lab Results  Component Value Date   CREATININE 0.98 06/18/2020   BUN 13 06/18/2020   NA 134 (L) 06/18/2020   K 3.5 06/18/2020   CL 98 06/18/2020   CO2 24 06/18/2020    Lab Results  Component Value Date   ALT 26 06/14/2020   AST 31 06/14/2020   ALKPHOS 69 06/14/2020   BILITOT 1.0 06/14/2020     Microbiology: Recent Results (from the past 240 hour(s))  Resp Panel by RT-PCR (Flu A&B, Covid)  Status: None   Collection Time: 06/14/20  2:04 AM  Result Value Ref Range Status   SARS Coronavirus 2 by RT PCR NEGATIVE NEGATIVE Final    Comment: (NOTE) SARS-CoV-2 target nucleic acids are NOT DETECTED.  The SARS-CoV-2 RNA is generally detectable in upper respiratory specimens during the acute phase of infection. The lowest concentration of SARS-CoV-2 viral copies this assay can detect is 138 copies/mL. A negative result does not preclude SARS-Cov-2 infection and should not be used as the sole basis for treatment or other patient management decisions. A negative result may occur with  improper specimen collection/handling, submission of specimen other than nasopharyngeal swab, presence of viral mutation(s) within the areas targeted by this assay, and inadequate number of viral copies(<138 copies/mL). A negative result must be combined with clinical observations, patient history, and epidemiological information. The expected result is Negative.  Fact Sheet for Patients:  BloggerCourse.com  Fact Sheet for Healthcare Providers:  SeriousBroker.it  This test is no t yet approved or cleared by the [place] FDA and  has been authorized for detection and/or diagnosis of SARS-CoV-2 by FDA under an Emergency Use Authorization (EUA). This EUA will remain  in effect (meaning this test can be used) for the  duration of the COVID-19 declaration under Section 564(b)(1) of the Act, 21 U.S.C.section 360bbb-3(b)(1), unless the authorization is terminated  or revoked sooner.       Influenza A by PCR NEGATIVE NEGATIVE Final   Influenza B by PCR NEGATIVE NEGATIVE Final    Comment: (NOTE) The Xpert Xpress SARS-CoV-2/FLU/RSV plus assay is intended as an aid in the diagnosis of influenza from Nasopharyngeal swab specimens and should not be used as a sole basis for treatment. Nasal washings and aspirates are unacceptable for Xpert Xpress SARS-CoV-2/FLU/RSV testing.  Fact Sheet for Patients: BloggerCourse.com  Fact Sheet for Healthcare Providers: SeriousBroker.it  This test is not yet approved or cleared by the [place] FDA and has been authorized for detection and/or diagnosis of SARS-CoV-2 by FDA under an Emergency Use Authorization (EUA). This EUA will remain in effect (meaning this test can be used) for the duration of the COVID-19 declaration under Section 564(b)(1) of the Act, 21 U.S.C. section 360bbb-3(b)(1), unless the authorization is terminated or revoked.  Performed at [facility], [address], [place], [place] [address]   SARS Coronavirus 2 by RT PCR (hospital order, performed in [facility] hospital lab) Nasopharyngeal Nasopharyngeal Swab     Status: None   Collection Time: 06/14/20  3:44 PM   Specimen: Nasopharyngeal Swab  Result Value Ref Range Status   SARS Coronavirus 2 NEGATIVE NEGATIVE Final    Comment: (NOTE) SARS-CoV-2 target nucleic acids are NOT DETECTED.  The SARS-CoV-2 RNA is generally detectable in upper and lower respiratory specimens during the acute phase of infection. The lowest concentration of SARS-CoV-2 viral copies this assay can detect is 250 copies / mL. A negative result does not preclude SARS-CoV-2 infection and should not be used as the sole basis for treatment or other patient  management decisions.  A negative result may occur with improper specimen collection / handling, submission of specimen other than nasopharyngeal swab, presence of viral mutation(s) within the areas targeted by this assay, and inadequate number of viral copies (<250 copies / mL). A negative result must be combined with clinical observations, patient history, and epidemiological information.  Fact Sheet for Patients:   BoilerBrush.com.cy  Fact Sheet for Healthcare Providers: https://pope.com/  This test is not yet approved  or  cleared by the [place] FDA and has been authorized for detection and/or diagnosis of SARS-CoV-2 by FDA under an Emergency Use Authorization (EUA).  This EUA will remain in effect (meaning this test can be used) for the duration of the COVID-19 declaration under Section 564(b)(1) of the Act, 21 U.S.C. section 360bbb-3(b)(1), unless the authorization is terminated or revoked sooner.  Performed at [facility], [address] [place], [place] [address]   Culture, blood (routine x 2) Call MD if unable to obtain prior to antibiotics being given     Status: None (Preliminary result)   Collection Time: 06/15/20  1:51 PM   Specimen: BLOOD  Result Value Ref Range Status   Specimen Description BLOOD LEFT ANTECUBITAL  Final   Special Requests   Final    BOTTLES DRAWN AEROBIC AND ANAEROBIC Blood Culture adequate volume   Culture   Final    NO GROWTH 3 DAYS Performed at [facility], [address] [place], [place] [address]    Report Status PENDING  Incomplete  Culture, blood (routine x 2) Call MD if unable to obtain prior to antibiotics being given     Status: None (Preliminary result)   Collection Time: 06/15/20  1:52 PM   Specimen: BLOOD RIGHT HAND  Result Value Ref Range Status   Specimen Description BLOOD RIGHT HAND  Final   Special Requests   Final    BOTTLES DRAWN AEROBIC AND ANAEROBIC Blood  Culture adequate volume   Culture   Final    NO GROWTH 3 DAYS Performed at [facility], [address] [place], [place] [address]    Report Status PENDING  Incomplete  MRSA PCR Screening     Status: None   Collection Time: 06/15/20  8:11 PM   Specimen: Nasal Mucosa; Nasopharyngeal  Result Value Ref Range Status   MRSA by PCR NEGATIVE NEGATIVE Final    Comment:        The GeneXpert MRSA Assay (FDA approved for NASAL specimens only), is one component of a comprehensive MRSA colonization surveillance program. It is not intended to diagnose MRSA infection nor to guide or monitor treatment for MRSA infections. Performed at [facility], [address] [place], [place] [address]   Acid Fast Smear (AFB)     Status: None   Collection Time: 06/17/20 10:45 AM   Specimen: CSF  Result Value Ref Range Status   AFB Specimen Processing Concentration  Final   Acid Fast Smear Negative  Final    Comment: (NOTE) Performed At: [facility] [address] [place], [place] [id number] Jolene SchimkeNagendra Sanjai MD XB:[id number]:[phone]    Source (AFB) SPU  Final    Comment: Performed at [facility], [address] [place], [place] [address]  CSF culture     Status: None (Preliminary result)   Collection Time: 06/17/20  3:38 PM   Specimen: PATH Cytology CSF; Cerebrospinal Fluid  Result Value Ref Range Status   Specimen Description CSF  Final   Special Requests NONE  Final   Gram Stain   Final    WBC PRESENT, PREDOMINANTLY MONONUCLEAR NO ORGANISMS SEEN CYTOSPIN SMEAR    Culture   Final    NO GROWTH < 24 HOURS Performed at [facility], [address] [place], [place] [address]    Report Status PENDING  Incomplete    Oaklan Persons Elaina PatteeN Jacorion Klem, DO PGY-2 [facility]  [facility] 06/18/2020, 9:12 AM

## 2020-06-18 NOTE — Consult Note (Signed)
Name: [name] [name] MRN: [id number] DOB: 04/10/1956    ADMISSION DATE:  06/14/2020 CONSULTATION DATE:  06/18/2020   REFERRING MD :  Neena Rhymes  CHIEF COMPLAINT: Pneumonia, vision loss   HISTORY OF PRESENT ILLNESS: 65 year old smoker for whom we are consulted to help evaluation of right lower lobe cavitary pneumonia and left visual loss possibly optic neuritis. He was admitted 2/6 complaining of 1 to 2 weeks of cough productive of "cotton-wool" like phlegm, fevers shortness of breath and right pleuritic chest pain.  He reports at least one episode of blood in sputum.  He tells me today that these symptoms have been ongoing for a few days but admission H&P reports 2 weeks of symptoms.  He also reported vision loss in his left eye which appears to have been gradually progressive since October 1 and he correlates this to after receiving second Covid vaccine.  He was found to have leukocytosis with WBC 16.8, febrile to 102, CT angiogram confirmed right lower lobe lobar pneumonia with evidence of early cavitation and reactive right mediastinal and right hilar lymph nodes  He was initially treated with broad-spectrum antibiotics. Urology was consulted, MRI showed left optic nerve enhancement concerning for optic neuritis and multiple old infarcts including in the pons.  CSF analysis only showed slight high protein of 80.  High-dose steroids was recommended but due to concern for tuberculosis in the setting of cavitary pneumonia, this was deferred. Infectious disease was consulted .  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 3. Mild-to-moderate multifocal T2/FLAIR hyperintensity within the cerebral white matter, basal ganglia and pons. Findings are nonspecific but likely at least partially reflect chronic small vessel ischemic disease. Sequela of a second superimposed process (i.e. demyelinating, infectious/inflammatory) cannot be excluded. 4. Chronic pontine lacunar infarcts. majority of the left optic nerve with associated abnormal enhancement. Findings likely reflect sequela of optic neuritis/neuropathy (   PAST MEDICAL HISTORY :  Past Medical History:  Diagnosis Date  . Arthritis    right hip  . Disc displacement, lumbar   . Gout   . Hypertension    Takes medications daily   Past Surgical History:  Procedure Laterality Date  . BACK SURGERY  1980s   x2:2013one back surgery  . COLONOSCOPY WITH PROPOFOL N/A 09/18/2013   Procedure: COLONOSCOPY WITH PROPOFOL;  Surgeon: Garlan Fair, MD;  Location: [facility];  Service: Endoscopy;  Laterality: N/A;   Prior to Admission medications   Medication Sig Start Date End Date Taking? Authorizing Provider  lisinopril (ZESTRIL) 40 MG tablet Take 40 mg by mouth daily.   Yes [provider]  hydrochlorothiazide (HYDRODIURIL) 25 MG tablet Take 1 tablet (25 mg total) by mouth daily. 05/17/11 07/26/11  Dickie La, MD   Allergies  Allergen Reactions  . Hctz [Hydrochlorothiazide]     Erectile dysfunction  FAMILY HISTORY:  Family History  Problem Relation Age of Onset  . Hypertension Mother    SOCIAL HISTORY:  reports that he has been smoking cigarettes. He has a 10.50 pack-year smoking history. He has never used  smokeless tobacco. He reports current alcohol use. He reports current drug use. Drugs: Marijuana and Cocaine.  REVIEW OF SYSTEMS:   Positive as above  Constitutional: Negative for fever, chills, weight loss, malaise/fatigue and diaphoresis.  HENT: Negative for hearing loss, ear pain, nosebleeds, congestion, sore throat, neck pain, tinnitus and ear discharge.   Eyes: Negative for  photophobia, pain, discharge and redness.  Respiratory: Negative for cough, hemoptysis, sputum production, shortness of breath, wheezing and stridor.   Cardiovascular: Negative for chest pain, palpitations, orthopnea, claudication, leg swelling and PND.  Gastrointestinal: Negative for heartburn, nausea, vomiting, abdominal pain, diarrhea, constipation, blood in stool and melena.  Genitourinary: Negative for dysuria, urgency, frequency, hematuria and flank pain.  Musculoskeletal: Negative for myalgias, back pain, joint pain and falls.  Skin: Negative for itching and rash.  Neurological: Negative for dizziness, tingling, tremors, sensory change, speech change, focal weakness, seizures, loss of consciousness, weakness and headaches.  Endo/Heme/Allergies: Negative for environmental allergies and polydipsia. Does not bruise/bleed easily.  SUBJECTIVE:   VITAL SIGNS: Temp:  [98 F (36.7 C)-99 F (37.2 C)] 98.3 F (36.8 C) (02/09 0959) Pulse Rate:  [74-85] 77 (02/09 0959) Resp:  [18-20] 18 (02/09 0959) BP: (97-116)/(63-76) 114/76 (02/09 0959) SpO2:  [93 %-100 %] 100 % (02/09 0959)  PHYSICAL EXAMINATION: Gen. Pleasant, well-nourished, in no distress, normal affect ENT - no lesions, no post nasal drip Neck: No JVD, no thyromegaly, no carotid bruits Lungs: no use of accessory muscles, no dullness to percussion, clear without rales or rhonchi , normal vocal resonance on right base Cardiovascular: Rhythm regular, heart sounds  normal, no murmurs, no peripheral edema Abdomen: soft and non-tender, no hepatosplenomegaly,  BS normal. Musculoskeletal: No deformities, no cyanosis or clubbing Neuro:  alert, non focal , left disconjugate gaze Skin:  Warm, no lesions/ rash   Recent Labs  Lab 06/16/20 0352 06/17/20 0453 06/18/20 0653  NA 131* 132* 134*  K 3.5 3.7 3.5  CL 92* 95* 98  CO2 _0 BUN _1 CREATININE 1.30* 1.14 0.98  GLUCOSE 95 114* 103*   Recent Labs  Lab 06/16/20 0352 06/17/20 0453 06/18/20 0653  HGB 8.8* 8.4* 7.5*  HCT 26.9* 25.4* 24.4*  WBC 19.4* 15.7* 15.0*  PLT 473* 396 440*   DG FLUORO GUIDE LUMBAR PUNCTURE  Result Date: 06/17/2020 CLINICAL DATA:  Optic neuritis. EXAM: DIAGNOSTIC LUMBAR PUNCTURE UNDER FLUOROSCOPIC GUIDANCE FLUOROSCOPY TIME:  Fluoroscopy Time:  54 seconds Radiation Exposure Index (if provided by the fluoroscopic device): 18.8 mGy Number of Acquired Spot Images: None PROCEDURE: Informed consent was obtained from the patient prior to the procedure with a discussion of potential complications including but not limited to headache, allergy, infection, pain, spinal cord/nerve root damage. With the patient prone, the lower back was prepped with Betadine. 1% Lidocaine was used for local anesthesia. Lumbar puncture was performed at the L4 laminectomy defect using a 20 gauge needle with return of clear CSF. 6 ml of CSF could be obtained for laboratory studies. The patient tolerated the procedure well without immediate post-procedure complication. IMPRESSION: Fluoroscopically-guided lumbar puncture performed at the L4 level. 6 mL of CSF could be obtained and sent for laboratory studies. The patient tolerated the procedure well without immediate post-procedure complication. Electronically Signed  By: Kellie Simmering DO   On: 06/17/2020 15:18    ASSESSMENT / PLAN:  Differential diagnosis of right lower lobe necrotizing pneumonia -includes bacterial, mycobacterial process and less commonly other organisms.  Aspiration is a possibility but he does not seem to have any risk  factors, he denies alcohol and drug use currently.  History of his respiratory symptoms are acute so I really doubt tuberculosis here I am not able to come up with a connection to his optic neuritis.  I understand that high-dose steroids is being considered.  As such it is reasonable to rule out uncommon infectious etiology of right lower lobe consolidation.  I offered him options including watchful waiting since he is symptomatically improved versus bronchoscopy.  He is willing to proceed.  Risks and benefits of the procedure including that of lung puncture and bleeding was discussed.  We will schedule procedure for 2 PM on 2/10. N.p.o. after 5 AM, light liquid breakfast allowed We will obtain BAL and also transbronchial biopsy   Kara Mead MD. FCCP. [facility] Pager [phone] If no response call [phone]    06/18/2020, 4:07 PM

## 2020-06-18 NOTE — Plan of Care (Signed)

## 2020-06-18 NOTE — Progress Notes (Signed)
Patient's sputum sample not enough to send to the lab. Patient encouraged to spit out some more before sample is sent.   [name] Pruiett,RN.

## 2020-06-19 ENCOUNTER — Encounter (HOSPITAL_COMMUNITY): Payer: Self-pay | Admitting: Internal Medicine

## 2020-06-19 ENCOUNTER — Encounter (HOSPITAL_COMMUNITY)
Admission: EM | Disposition: A | Discharge status: Home / Self Care | Payer: Self-pay | Source: Home / Self Care | Attending: Internal Medicine

## 2020-06-19 ENCOUNTER — Inpatient Hospital Stay (HOSPITAL_COMMUNITY): Payer: Medicaid Other

## 2020-06-19 DIAGNOSIS — H469 Unspecified optic neuritis: Secondary | ICD-10-CM | POA: Diagnosis not present

## 2020-06-19 DIAGNOSIS — H5460 Unqualified visual loss, one eye, unspecified: Secondary | ICD-10-CM | POA: Diagnosis not present

## 2020-06-19 DIAGNOSIS — J189 Pneumonia, unspecified organism: Secondary | ICD-10-CM | POA: Diagnosis not present

## 2020-06-19 DIAGNOSIS — J984 Other disorders of lung: Secondary | ICD-10-CM | POA: Diagnosis not present

## 2020-06-19 HISTORY — PX: BRONCHIAL BIOPSY: SHX5109

## 2020-06-19 HISTORY — PX: BRONCHIAL WASHINGS: SHX5105

## 2020-06-19 HISTORY — PX: BRONCHIAL BRUSHINGS: SHX5108

## 2020-06-19 HISTORY — PX: VIDEO BRONCHOSCOPY: SHX5072

## 2020-06-19 LAB — QUANTIFERON-TB GOLD PLUS: QuantiFERON-TB Gold Plus: POSITIVE — AB

## 2020-06-19 LAB — BASIC METABOLIC PANEL
Anion gap: 11 (ref 5–15)
BUN: 11 mg/dL (ref 8–23)
CO2: 24 mmol/L (ref 22–32)
Calcium: 8.4 mg/dL — ABNORMAL LOW (ref 8.9–10.3)
Chloride: 97 mmol/L — ABNORMAL LOW (ref 98–111)
Creatinine, Ser: 1.09 mg/dL (ref 0.61–1.24)
GFR, Estimated: >60 mL/min (ref >60)
Glucose, Bld: 109 mg/dL — ABNORMAL HIGH (ref 70–99)
Potassium: 3.7 mmol/L (ref 3.5–5.1)
Sodium: 132 mmol/L — ABNORMAL LOW (ref 135–145)

## 2020-06-19 LAB — QUANTIFERON-TB GOLD PLUS (RQFGPL)
QuantiFERON Mitogen Value: >10 [IU]/mL
QuantiFERON Nil Value: 0.08 [IU]/mL
QuantiFERON TB1 Ag Value: 0.23 [IU]/mL
QuantiFERON TB2 Ag Value: 0.45 [IU]/mL

## 2020-06-19 LAB — CBC
HCT: 24.5 % — ABNORMAL LOW (ref 39.0–52.0)
Hemoglobin: 7.6 g/dL — ABNORMAL LOW (ref 13.0–17.0)
MCH: 30.8 pg (ref 26.0–34.0)
MCHC: 31 g/dL (ref 30.0–36.0)
MCV: 99.2 fL (ref 80.0–100.0)
Platelets: 467 10*3/uL — ABNORMAL HIGH (ref 150–400)
RBC: 2.47 MIL/uL — ABNORMAL LOW (ref 4.22–5.81)
RDW: 13.5 % (ref 11.5–15.5)
WBC: 15.2 10*3/uL — ABNORMAL HIGH (ref 4.0–10.5)
nRBC: 0.1 % (ref 0.0–0.2)

## 2020-06-19 LAB — CYTOLOGY - NON PAP

## 2020-06-19 LAB — ACID FAST SMEAR (AFB, MYCOBACTERIA): Acid Fast Smear: NEGATIVE

## 2020-06-19 LAB — LYME DISEASE DNA BY PCR(BORRELIA BURG): Lyme Disease(B.burgdorferi)PCR: NEGATIVE

## 2020-06-19 LAB — MPO/PR-3 (ANCA) ANTIBODIES
ANCA Proteinase 3: <3.5 U/mL (ref 0.0–3.5)
Myeloperoxidase Abs: <9 U/mL (ref 0.0–9.0)

## 2020-06-19 SURGERY — BRONCHOSCOPY, WITH FLUOROSCOPY
Anesthesia: Moderate Sedation

## 2020-06-19 MED ORDER — LIDOCAINE HCL URETHRAL/MUCOSAL 2 % EX GEL
CUTANEOUS | Status: AC
  Filled 2020-06-19: qty 11

## 2020-06-19 MED ORDER — LIDOCAINE HCL URETHRAL/MUCOSAL 2 % EX GEL
1.0000 "application " | Freq: Once | CUTANEOUS | Status: AC
  Administered 2020-06-19: 1 via TOPICAL
  Filled 2020-06-19: qty 11

## 2020-06-19 MED ORDER — LIDOCAINE HCL (PF) 1 % IJ SOLN
INTRAMUSCULAR | Status: DC | PRN
  Administered 2020-06-19 (×3): 2 mL

## 2020-06-19 MED ORDER — BUTAMBEN-TETRACAINE-BENZOCAINE 2-2-14 % EX AERO
1.0000 | INHALATION_SPRAY | Freq: Once | CUTANEOUS | Status: AC
  Administered 2020-06-19: 1 via TOPICAL
  Filled 2020-06-19: qty 20

## 2020-06-19 MED ORDER — PHENYLEPHRINE HCL 0.25 % NA SOLN
1.0000 | Freq: Four times a day (QID) | NASAL | Status: DC | PRN
  Administered 2020-06-19: 1 via NASAL
  Filled 2020-06-19: qty 15

## 2020-06-19 MED ORDER — ENOXAPARIN SODIUM 40 MG/0.4ML ~~LOC~~ SOLN
40.0000 mg | SUBCUTANEOUS | Status: DC
  Administered 2020-06-20: 40 mg via SUBCUTANEOUS
  Filled 2020-06-19 (×2): qty 0.4

## 2020-06-19 MED ORDER — MUSCLE RUB 10-15 % EX CREA
TOPICAL_CREAM | CUTANEOUS | Status: DC | PRN
  Filled 2020-06-19: qty 85

## 2020-06-19 MED ORDER — PHENYLEPHRINE HCL 0.25 % NA SOLN
NASAL | Status: AC
  Filled 2020-06-19: qty 15

## 2020-06-19 MED ORDER — LIDOCAINE HCL (PF) 1 % IJ SOLN
INTRAMUSCULAR | Status: AC
  Filled 2020-06-19: qty 30

## 2020-06-19 MED ORDER — MIDAZOLAM HCL (PF) 5 MG/ML IJ SOLN
INTRAMUSCULAR | Status: AC
  Filled 2020-06-19: qty 2

## 2020-06-19 MED ORDER — FENTANYL CITRATE (PF) 100 MCG/2ML IJ SOLN
INTRAMUSCULAR | Status: DC | PRN
  Administered 2020-06-19 (×2): 50 ug via INTRAVENOUS
  Administered 2020-06-19: 25 ug via INTRAVENOUS
  Administered 2020-06-19: 50 ug via INTRAVENOUS
  Administered 2020-06-19: 25 ug via INTRAVENOUS

## 2020-06-19 MED ORDER — MIDAZOLAM HCL (PF) 10 MG/2ML IJ SOLN
INTRAMUSCULAR | Status: DC | PRN
  Administered 2020-06-19 (×4): 1 mg via INTRAVENOUS

## 2020-06-19 MED ORDER — FENTANYL CITRATE (PF) 100 MCG/2ML IJ SOLN
INTRAMUSCULAR | Status: AC
  Filled 2020-06-19: qty 4

## 2020-06-19 MED ORDER — DICLOFENAC SODIUM 1 % EX GEL
2.0000 g | Freq: Four times a day (QID) | CUTANEOUS | Status: DC | PRN
  Administered 2020-06-19: 2 g via TOPICAL
  Filled 2020-06-19: qty 100

## 2020-06-19 NOTE — Progress Notes (Signed)
 Subjective:  Patient seen with ophthalmologist at bedside. Reports doing well. Anticipatory of near-discharge as soon as possible. Again denies recall of notable episode of facial trauma in the past. Only explanation he can think of is football.    Objective:  Vital signs in last 24 hours: Vitals:   06/18/20 2053 06/18/20 2100 06/19/20 0523 06/19/20 0942  BP: 106/62  (!) 119/97 111/70  Pulse: 67  90 70  Resp: 18  19 18  Temp:  98.1 F (36.7 C) 98.1 F (36.7 C) 98.2 F (36.8 C)  TempSrc:  Oral Oral Oral  SpO2: 100%  96% 98%  Weight:      Height:       Weight change:   Intake/Output Summary (Last 24 hours) at 06/19/2020 1112 Last data filed at 06/19/2020 0900 Gross per 24 hour  Intake 600 ml  Output 425 ml  Net 175 ml     Physical Exam Constitutional:      General: He is not in acute distress.    Appearance: He is not ill-appearing, toxic-appearing or diaphoretic.     Comments: Patient seen lying in bed, NAD. Breathing comfortably on RA. Intermittently coughing with prolonged latency in between as compared to previous  Pulmonary:     Effort: Pulmonary effort is normal.  Abdominal:     Tenderness: There is no abdominal tenderness. There is no rebound.  Neurological:     Mental Status: He is alert.  Psychiatric:        Mood and Affect: Mood normal.        Behavior: Behavior normal.     Assessment/Plan: Hx of cleared Hep B and Hep C   Principal Problem:   Cavitary pneumonia Active Problems:   Necrotizing pneumonia (HCC)   Monocular vision loss   Cavitary RLLPneumonia Patient presenting with two weeks of productive cough, tactile fevers, and chills. Labs significant for leukocytosis with left shift, that is downtrending.Contrasted CTA showedRLL pneumonia with consolidation and potential early cavitation,concern fornecrotizing pneumonia; and reactive lymph nodes; no pleural effusion. 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 Blood cultures without growth to date.    *Appreciate Pulmonology recs:   -Bronchoscopy today      -sending BAL for AFB & obtaining transbronchial biopsies   -Okay to discharge when able, with f/u Pulm appointment to discuss results *ID recs:    -Day 2 Augmentin: plan for 4 weeks -F/u blood cultures -Airborne& Dropletprecautions  -Robitussin PRN -Tylenol PRN -Trend CBC   Optic Neuritis Monocular Vision Loss Chronic pontine lacunar infarctions and cortical infarctions MRI with evidence of optic neuritis involving the entire left optic nerve as well as questions ofpossible demyelinating, infectious, or inflammatory sequela within the cerebral white matter, basal ganglia, and pons. On exam his left pupil is fixed and non reactive. Also with left cranial nerve III palsy, and chronicdyscongugate gaze.Patient reporting hx of strabismus surgery in childhood.ESR & CRPelevated, in the context of infection. 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 6 mL CSF obtained. CSF analysis showed normal glucose of 56, and protein elevation to 80; with no organisms detected. Does not rule out TB involvement. Elevated CSF protein; CSF IgG elevated at 14.5. Quantiferon gold on 2/8 positive.     *Ophtalmology following, appreciate recs -Findings consistent with old central retinal vein occlusion   -outpatient general eye care     *ID following, appreciate recs -Await Bronch results, (+) Quantiferon gold raises potential for TB etiology (  maybe recent conversion, with subsequent primary progressive TB) -Can consider starting steroids if negative for infection etiology.      *Neurology following, appreciate recs: -ASA 81 prophylaxis -Holding steroids for now   Right ankle Arthritis PRN Voltaren gel      LOS: 4 days   Azell Der, Medical Student 06/19/2020, 11:12 AM

## 2020-06-19 NOTE — Op Note (Signed)
Indication : RLL consolidation with LT visual loss in this  smoker. Written informed consent was obtained prior to the procedure. The risks of the procedure including coughing, bleeding and the small chance of lung puncture requiring chest tube were discussed in great detail. The benefits & alternatives including serial follow up were also discussed.   4 mg versed & 200  mcg fentnayl used in divided doses during the procedure Bronchoscope entered from the right nare. Upper airway nml Vocal cords showed nml appearance & motion. Trachea & bronchial tree examined to the subsegmental level. Mild amount of white secretions were noted. No endobronchial lesions seen. Brusings x 1Trans bronchial biopsies x 4 were obtained from the RLL under fluoroscopy. BAL was also obtained from the RUL.  There was moderate coughing  during the procedure.   A CXR will be performed to r/o presence of pneumothorax.  Specimens sent : BAL for micro & cytology RLL Brushing for cytology Tbbx for path  [name] [name] V.  [phone]

## 2020-06-19 NOTE — Progress Notes (Signed)
[facility]  Date of Admission:  06/14/2020   Total days of antibiotics 5        Day 2 augmentin        ASSESSMENT: RLL cavitary pneumonia Left optic neuritis  PLAN: 1. RLL cavitary pneumonia- plan for bronchoscopy today. Fungal serology is negative. TB, autoimmune and vasculitis workup pending. TB still on differential, but less likely. Continue Augmentin, likely for 4 weeks.  2. Left optic neuritis- per ophthalmology, no evidence of ocular inflammation or infection. Recommended outpatient general eye care.  Principal Problem:   Cavitary pneumonia Active Problems:   Necrotizing pneumonia (HCC)   Monocular vision loss   Scheduled Meds: . amoxicillin-clavulanate  1 tablet Oral Q12H  . aspirin EC  81 mg Oral Daily  . folic acid  1 mg Oral Daily  . multivitamin with minerals  1 tablet Oral Daily  . thiamine  100 mg Oral Daily   Or  . thiamine  100 mg Intravenous Daily  . tuberculin  5 Units Intradermal Once   Continuous Infusions: PRN Meds:.acetaminophen **OR** acetaminophen, guaiFENesin-dextromethorphan, Muscle Rub   SUBJECTIVE: Patient reports feeling well today. States his vision is better than when he came into the hospital. Continues to have a cough. Denies any pain or discomfort. Expresses he really wants to be discharged home today.  Review of Systems: Review of Systems  Constitutional: Negative for chills, diaphoresis and fever.  HENT: Negative for congestion and sore throat.   Eyes: Positive for blurred vision.  Respiratory: Positive for cough. Negative for shortness of breath.   Cardiovascular: Negative for chest pain and leg swelling.  Gastrointestinal: Negative for diarrhea, nausea and vomiting.  Musculoskeletal: Negative for joint pain.  Skin: Negative for itching and rash.  Neurological: Negative for weakness and headaches.    Allergies  Allergen Reactions  . Hctz [Hydrochlorothiazide]     Erectile dysfunction     OBJECTIVE: Vitals:   06/18/20 1659 06/18/20 2053 06/18/20 2100 06/19/20 0523  BP: 99/72 106/62  (!) 119/97  Pulse: 73 67  90  Resp: 18 18  19   Temp: 98.4 F (36.9 C)  98.1 F (36.7 C) 98.1 F (36.7 C)  TempSrc: Oral  Oral Oral  SpO2: 92% 100%  96%  Weight:      Height:       Body mass index is 25.86 kg/m.  Physical Exam Vitals reviewed.  Constitutional:      Appearance: Normal appearance.  HENT:     Head: Normocephalic and atraumatic.  Cardiovascular:     Rate and Rhythm: Normal rate.     Heart sounds: Normal heart sounds. No murmur heard. No friction rub. No gallop.   Pulmonary:     Effort: Pulmonary effort is normal. No respiratory distress.     Breath sounds: Rales present. No wheezing or rhonchi.  Abdominal:     General: Abdomen is flat. Bowel sounds are normal. There is no distension.     Palpations: Abdomen is soft.     Tenderness: There is no abdominal tenderness. There is no guarding.  Musculoskeletal:        General: No swelling or tenderness.     Right lower leg: No edema.     Left lower leg: No edema.  Skin:    General: Skin is warm and dry.  Neurological:     Mental Status: He is alert and oriented to person, place, and time.  Psychiatric:  Mood and Affect: Mood normal.        Behavior: Behavior normal.        Thought Content: Thought content normal.        Judgment: Judgment normal.     Lab Results Lab Results  Component Value Date   WBC 15.2 (H) 06/19/2020   HGB 7.6 (L) 06/19/2020   HCT 24.5 (L) 06/19/2020   MCV 99.2 06/19/2020   PLT 467 (H) 06/19/2020    Lab Results  Component Value Date   CREATININE 1.09 06/19/2020   BUN 11 06/19/2020   NA 132 (L) 06/19/2020   K 3.7 06/19/2020   CL 97 (L) 06/19/2020   CO2 24 06/19/2020    Lab Results  Component Value Date   ALT 26 06/14/2020   AST 31 06/14/2020   ALKPHOS 69 06/14/2020   BILITOT 1.0 06/14/2020     Microbiology: Recent Results (from the past 240 hour(s))  Resp  Panel by RT-PCR (Flu A&B, Covid)     Status: None   Collection Time: 06/14/20  2:04 AM  Result Value Ref Range Status   SARS Coronavirus 2 by RT PCR NEGATIVE NEGATIVE Final    Comment: (NOTE) SARS-CoV-2 target nucleic acids are NOT DETECTED.  The SARS-CoV-2 RNA is generally detectable in upper respiratory specimens during the acute phase of infection. The lowest concentration of SARS-CoV-2 viral copies this assay can detect is 138 copies/mL. A negative result does not preclude SARS-Cov-2 infection and should not be used as the sole basis for treatment or other patient management decisions. A negative result may occur with  improper specimen collection/handling, submission of specimen other than nasopharyngeal swab, presence of viral mutation(s) within the areas targeted by this assay, and inadequate number of viral copies(<138 copies/mL). A negative result must be combined with clinical observations, patient history, and epidemiological information. The expected result is Negative.  Fact Sheet for Patients:  BloggerCourse.com  Fact Sheet for Healthcare Providers:  SeriousBroker.it  This test is no t yet approved or cleared by the [place] FDA and  has been authorized for detection and/or diagnosis of SARS-CoV-2 by FDA under an Emergency Use Authorization (EUA). This EUA will remain  in effect (meaning this test can be used) for the duration of the COVID-19 declaration under Section 564(b)(1) of the Act, 21 U.S.C.section 360bbb-3(b)(1), unless the authorization is terminated  or revoked sooner.       Influenza A by PCR NEGATIVE NEGATIVE Final   Influenza B by PCR NEGATIVE NEGATIVE Final    Comment: (NOTE) The Xpert Xpress SARS-CoV-2/FLU/RSV plus assay is intended as an aid in the diagnosis of influenza from Nasopharyngeal swab specimens and should not be used as a sole basis for treatment. Nasal washings and aspirates are  unacceptable for Xpert Xpress SARS-CoV-2/FLU/RSV testing.  Fact Sheet for Patients: BloggerCourse.com  Fact Sheet for Healthcare Providers: SeriousBroker.it  This test is not yet approved or cleared by the [place] FDA and has been authorized for detection and/or diagnosis of SARS-CoV-2 by FDA under an Emergency Use Authorization (EUA). This EUA will remain in effect (meaning this test can be used) for the duration of the COVID-19 declaration under Section 564(b)(1) of the Act, 21 U.S.C. section 360bbb-3(b)(1), unless the authorization is terminated or revoked.  Performed at [facility], [address] [place], [place] [address]   SARS Coronavirus 2 by RT PCR (hospital order, performed in [facility] hospital lab) Nasopharyngeal Nasopharyngeal Swab     Status: None  Collection Time: 06/14/20  3:44 PM   Specimen: Nasopharyngeal Swab  Result Value Ref Range Status   SARS Coronavirus 2 NEGATIVE NEGATIVE Final    Comment: (NOTE) SARS-CoV-2 target nucleic acids are NOT DETECTED.  The SARS-CoV-2 RNA is generally detectable in upper and lower respiratory specimens during the acute phase of infection. The lowest concentration of SARS-CoV-2 viral copies this assay can detect is 250 copies / mL. A negative result does not preclude SARS-CoV-2 infection and should not be used as the sole basis for treatment or other patient management decisions.  A negative result may occur with improper specimen collection / handling, submission of specimen other than nasopharyngeal swab, presence of viral mutation(s) within the areas targeted by this assay, and inadequate number of viral copies (<250 copies / mL). A negative result must be combined with clinical observations, patient history, and epidemiological information.  Fact Sheet for Patients:   BoilerBrush.com.cy  Fact Sheet for Healthcare  Providers: https://pope.com/  This test is not yet approved or  cleared by the [place] FDA and has been authorized for detection and/or diagnosis of SARS-CoV-2 by FDA under an Emergency Use Authorization (EUA).  This EUA will remain in effect (meaning this test can be used) for the duration of the COVID-19 declaration under Section 564(b)(1) of the Act, 21 U.S.C. section 360bbb-3(b)(1), unless the authorization is terminated or revoked sooner.  Performed at [facility], [address], [place], [place] [address]   Culture, blood (routine x 2) Call MD if unable to obtain prior to antibiotics being given     Status: None (Preliminary result)   Collection Time: 06/15/20  1:51 PM   Specimen: BLOOD  Result Value Ref Range Status   Specimen Description BLOOD LEFT ANTECUBITAL  Final   Special Requests   Final    BOTTLES DRAWN AEROBIC AND ANAEROBIC Blood Culture adequate volume   Culture   Final    NO GROWTH 4 DAYS Performed at [facility], [address], [place], [place] [address]    Report Status PENDING  Incomplete  Culture, blood (routine x 2) Call MD if unable to obtain prior to antibiotics being given     Status: None (Preliminary result)   Collection Time: 06/15/20  1:52 PM   Specimen: BLOOD RIGHT HAND  Result Value Ref Range Status   Specimen Description BLOOD RIGHT HAND  Final   Special Requests   Final    BOTTLES DRAWN AEROBIC AND ANAEROBIC Blood Culture adequate volume   Culture   Final    NO GROWTH 4 DAYS Performed at [facility], [address], [place], [place] [address]    Report Status PENDING  Incomplete  MRSA PCR Screening     Status: None   Collection Time: 06/15/20  8:11 PM   Specimen: Nasal Mucosa; Nasopharyngeal  Result Value Ref Range Status   MRSA by PCR NEGATIVE NEGATIVE Final    Comment:        The GeneXpert MRSA Assay (FDA approved for NASAL specimens only), is one component of a comprehensive MRSA  colonization surveillance program. It is not intended to diagnose MRSA infection nor to guide or monitor treatment for MRSA infections. Performed at [facility], [address], [place], [place] [address]   Acid Fast Smear (AFB)     Status: None   Collection Time: 06/17/20 10:45 AM   Specimen: Sputum  Result Value Ref Range Status   AFB Specimen Processing Concentration  Final   Acid Fast Smear Negative  Final    Comment: (NOTE) Performed At: [facility] [address] [place], [place] [id number] Jolene Schimke MD [id number]    Source (AFB) SPU  Final    Comment: Performed at [facility], [address] [place], [place] [address]  Acid Fast Smear (AFB)     Status: None   Collection Time: 06/17/20 10:45 AM   Specimen: CSF  Result Value Ref Range Status   AFB Specimen Processing Concentration  Final   Acid Fast Smear Negative  Final    Comment: (NOTE) Performed At: [facility] [address] [place], [place] [id number] Jolene Schimke MD [id number]    Source (AFB) SPU  Final    Comment: Performed at [facility], [address] [place], [place] [address]  CSF culture     Status: None (Preliminary result)   Collection Time: 06/17/20  3:38 PM   Specimen: PATH Cytology CSF; Cerebrospinal Fluid  Result Value Ref Range Status   Specimen Description CSF  Final   Special Requests NONE  Final   Gram Stain   Final    WBC PRESENT, PREDOMINANTLY MONONUCLEAR NO ORGANISMS SEEN CYTOSPIN SMEAR    Culture   Final    NO GROWTH 2 DAYS Performed at [facility], [address], [place], [place] [address]    Report Status PENDING  Incomplete  Expectorated sputum assessment w rflx to resp cult     Status: None   Collection Time: 06/17/20  4:42 PM   Specimen: Expectorated Sputum  Result Value Ref Range Status   Specimen Description EXPECTORATED SPUTUM  Final   Special Requests NONE  Final   Sputum evaluation   Final    THIS SPECIMEN  IS ACCEPTABLE FOR SPUTUM CULTURE Performed at [facility], [address], [place], [place] [address]    Report Status 06/18/2020 FINAL  Final  Culture, respiratory     Status: None (Preliminary result)   Collection Time: 06/17/20  4:42 PM  Result Value Ref Range Status   Specimen Description EXPECTORATED SPUTUM  Final   Special Requests NONE Reflexed from [id number]  Final   Gram Stain   Final    RARE WBC PRESENT,BOTH PMN AND MONONUCLEAR FEW GRAM VARIABLE ROD RARE GRAM POSITIVE COCCI IN PAIRS Performed at [facility], [address], [place], [place] [address]    Culture PENDING  Incomplete   Report Status PENDING  Incomplete    Jazyah Butsch Elaina Pattee, DO PGY-2 IM  [facility] 06/19/2020, 9:12 AM

## 2020-06-19 NOTE — Progress Notes (Signed)
Name: Coltrane Tugwell MRN: [id number] DOB: 08-11-1955    ADMISSION DATE:  06/14/2020 CONSULTATION DATE:  06/19/2020   REFERRING MD :  Neena Rhymes  CHIEF COMPLAINT: Pneumonia, vision loss   HISTORY OF PRESENT ILLNESS: 65 year old smoker for whom we are consulted to help evaluation of right lower lobe cavitary pneumonia and left visual loss possibly optic neuritis. He was admitted 2/6 complaining of 1 to 2 weeks of cough productive of "cotton-wool" like phlegm, fevers shortness of breath and right pleuritic chest pain.  He reports at least one episode of blood in sputum.  He tells me today that these symptoms have been ongoing for a few days but admission H&P reports 2 weeks of symptoms.  He also reported vision loss in his left eye which appears to have been gradually progressive since October 1 and he correlates this to after receiving second Covid vaccine.  He was found to have leukocytosis with WBC 16.8, febrile to 102, CT angiogram confirmed right lower lobe lobar pneumonia with evidence of early cavitation and reactive right mediastinal and right hilar lymph nodes  He was initially treated with broad-spectrum antibiotics. Urology was consulted, MRI showed left optic nerve enhancement concerning for optic neuritis and multiple old infarcts including in the pons.  CSF analysis only showed slight high protein of 80.  High-dose steroids was recommended but due to concern for tuberculosis in the setting of cavitary pneumonia, this was deferred. Infectious disease was consulted .  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 3. Mild-to-moderate multifocal T2/FLAIR hyperintensity within the cerebral white matter, basal ganglia and pons. Findings are nonspecific but likely at least partially reflect chronic small vessel ischemic disease. Sequela of a second superimposed process (i.e. demyelinating, infectious/inflammatory) cannot be excluded. 4. Chronic pontine lacunar infarcts. majority of the left optic nerve with associated abnormal enhancement. Findings likely reflect sequela of optic neuritis/neuropathy (  2/10 Ophthal   Consult >> evidence of old left CRV O  SUBJECTIVE:   Unchanged vision left eye Denies dyspnea, no sputum production  VITAL SIGNS: Temp:  [98.1 F (36.7 C)-98.4 F (36.9 C)] 98.2 F (36.8 C) (02/10 0942) Pulse Rate:  [67-90] 70 (02/10 0942) Resp:  [18-19] 18 (02/10 0942) BP: (99-119)/(62-97) 111/70 (02/10 0942) SpO2:  [92 %-100 %] 98 % (02/10 0942)  PHYSICAL EXAMINATION: Gen. Pleasant, well-nourished, in no distress, normal affect ENT - no lesions, no post nasal drip Neck: No JVD, no thyromegaly, no carotid bruits Lungs: No accessory muscle use, clear, no rhonchi Cardiovascular: Rhythm regular, heart sounds  normal, no murmurs, no peripheral edema Abdomen: soft and non-tender, no hepatosplenomegaly, BS normal. Musculoskeletal: No deformities, no cyanosis or clubbing Neuro: Alert, interactive, nonfocal, left disconjugate gaze Skin:  Warm, no lesions/ rash   Recent Labs  Lab 06/17/20 0453 06/18/20 0653 06/19/20 0258  NA 132* 134*  132*  K 3.7 3.5 3.7  CL 95* 98 97*  CO2 _0 BUN _1 CREATININE 1.14 0.98 1.09  GLUCOSE  114* 103* 109*   Recent Labs  Lab 06/17/20 0453 06/18/20 0653 06/19/20 0258  HGB 8.4* 7.5* 7.6*  HCT 25.4* 24.4* 24.5*  WBC 15.7* 15.0* 15.2*  PLT 396 440* 467*   DG FLUORO GUIDE LUMBAR PUNCTURE  Result Date: 06/17/2020 CLINICAL DATA:  Optic neuritis. EXAM: DIAGNOSTIC LUMBAR PUNCTURE UNDER FLUOROSCOPIC GUIDANCE FLUOROSCOPY TIME:  Fluoroscopy Time:  54 seconds Radiation Exposure Index (if provided by the fluoroscopic device): 18.8 mGy Number of Acquired Spot Images: None PROCEDURE: Informed consent was obtained from the patient prior to the procedure with a discussion of potential complications including but not limited to headache, allergy, infection, pain, spinal cord/nerve root damage. With the patient prone, the lower back was prepped with Betadine. 1% Lidocaine was used for local anesthesia. Lumbar puncture was performed at the L4 laminectomy defect using a 20 gauge needle with return of clear CSF. 6 ml of CSF could be obtained for laboratory studies. The patient tolerated the procedure well without immediate post-procedure complication. IMPRESSION: Fluoroscopically-guided lumbar puncture performed at the L4 level. 6 mL of CSF could be obtained and sent for laboratory studies. The patient tolerated the procedure well without immediate post-procedure complication. Electronically Signed   By: Kellie Simmering DO   On: 06/17/2020 15:18    ASSESSMENT / PLAN:  Differential diagnosis of right lower lobe necrotizing pneumonia -includes bacterial, mycobacterial process and less commonly other organisms.  Aspiration is a possibility but he does not seem to have any risk factors, he denies alcohol and drug use currently.  History of his respiratory symptoms are acute so I really doubt tuberculosis here Visual loss seems to be related to old left CRV O, not clear if optic neuritis is still a consideration   Recommend -bronchoscopy today, will send BAL for AFB and also obtain transbronchial biopsies. -He  can be discharged from a pulmonary standpoint if follow-up can be arranged to follow-up on results   Kara Mead MD. FCCP. [facility] Pager [phone] If no response call [phone]    06/19/2020, 10:42 AM

## 2020-06-19 NOTE — Progress Notes (Addendum)
Patient's PPD TB test came back negative, with no elevation and no discoloration on the skin noted, MD has been notified and RN will continue to monitor this patient.

## 2020-06-19 NOTE — Consult Note (Signed)
Reason for Consult:vision loss in a patient with cavitary pneumonia Referring Physician: internal med.   [name] [name] is an 65 y.o. male.  HPI: history of strabismus as a child status post ocular muscle surgery OS. Surgery " didn't work" according to patient. Vision loss for 1-3 months but " not sure". Pt feels that the vision loss "is not related to breathing problem".  Pt also has evidence of an old L orbital fracture (lamina papyracea) that the dose not recall.   Past Medical History:  Diagnosis Date  . Arthritis    right hip  . Disc displacement, lumbar   . Gout   . Hypertension    Takes medications daily    Past Surgical History:  Procedure Laterality Date  . BACK SURGERY  1980s   x2:2013one back surgery  . COLONOSCOPY WITH PROPOFOL N/A 09/18/2013   Procedure: COLONOSCOPY WITH PROPOFOL;  Surgeon: Charolett BumpersMartin K Johnson, MD;  Location: [facility];  Service: Endoscopy;  Laterality: N/A;    Family History  Problem Relation Age of Onset  . Hypertension Mother     Social History:  reports that he has been smoking cigarettes. He has a 10.50 pack-year smoking history. He has never used smokeless tobacco. He reports current alcohol use. He reports current drug use. Drugs: Marijuana and Cocaine.  Allergies:  Allergies  Allergen Reactions  . Hctz [Hydrochlorothiazide]     Erectile dysfunction    Medications: I have reviewed the patient's current medications.  Results for orders placed or performed during the hospital encounter of 06/14/20 (from the past 48 hour(s))  QuantiFERON-TB Gold Plus     Status: Abnormal   Collection Time: 06/17/20 10:40 AM  Result Value Ref Range   QuantiFERON Incubation Incubation performed.    QuantiFERON-TB Gold Plus Positive (A) Negative    Comment: (NOTE) Chemiluminescence immunoassay methodology Performed At: [facility] [address] [place], [place] [id number] Jolene SchimkeNagendra Sanjai MD WU:[id number]:[phone]   Olena MaterQuantiFERON-TB Gold Plus     Status:  None   Collection Time: 06/17/20 10:40 AM  Result Value Ref Range   QuantiFERON Criteria Comment     Comment: (NOTE) The QuantiFERON-TB Gold Plus result is determined by subtracting the Nil value from either TB antigen (Ag) tube. The mitogen tube serves as a control for the test.    QuantiFERON TB1 Ag Value 0.23 IU/mL   QuantiFERON TB2 Ag Value 0.45 IU/mL   QuantiFERON Nil Value 0.08 IU/mL   QuantiFERON Mitogen Value >10.00 IU/mL    Comment: (NOTE) Performed At: [facility] [place] [address] [place], [place] [id number] Jolene SchimkeNagendra Sanjai MD VH:[id number]:[phone]   Acid Fast Smear (AFB)     Status: None   Collection Time: 06/17/20 10:45 AM   Specimen: Sputum  Result Value Ref Range   AFB Specimen Processing Concentration    Acid Fast Smear Negative     Comment: (NOTE) Performed At: [facility] [address] [place], [place] [id number] Jolene SchimkeNagendra Sanjai MD UV:[id number]:[phone]    Source (AFB) SPU     Comment: Performed at [facility], [address] [place], [place] [address]  Acid Fast Smear (AFB)     Status: None   Collection Time: 06/17/20 10:45 AM   Specimen: CSF  Result Value Ref Range   AFB Specimen Processing Concentration    Acid Fast Smear Negative     Comment: (NOTE) Performed At: [facility] [place] [address] [place], [place] [id number] Jolene SchimkeNagendra Sanjai MD PI:[id number]:[phone]    Source (AFB) SPU  Comment: Performed at [facility], [address], [place], [place] [address]  Glucose, CSF     Status: None   Collection Time: 06/17/20  3:38 PM  Result Value Ref Range   Glucose, CSF 56 40 - 70 mg/dL    Comment: Performed at [facility], [address], [place], [place] [address]  Protein, CSF     Status: Abnormal   Collection Time: 06/17/20  3:38 PM  Result Value Ref Range   Total  Protein, CSF 80 (H) 15 - 45 mg/dL    Comment: Performed at [facility], [address] [place], [place] [address]  VDRL, CSF     Status: None   Collection  Time: 06/17/20  3:38 PM  Result Value Ref Range   VDRL Quant, CSF Non Reactive Non Rea:<1:1    Comment: (NOTE) Performed At: [facility] [address] [place], [place] [id number] Jolene Schimke MD GT:[id number]   CSF cell count with differential     Status: None   Collection Time: 06/17/20  3:38 PM  Result Value Ref Range   Tube # 1    Color, CSF COLORLESS COLORLESS   Appearance, CSF CLEAR CLEAR   Supernatant NOT INDICATED    RBC Count, CSF 0 0 /cu mm   WBC, CSF 2 0 - 5 /cu mm   Other Cells, CSF TOO FEW TO COUNT, SMEAR AVAILABLE FOR REVIEW     Comment: RARE NEUTROPHIL,FEW LYMPHOCYTES AND FEW MONOCYTES NOTED Performed at [facility], [address] [place], [place] [address]   CSF culture     Status: None (Preliminary result)   Collection Time: 06/17/20  3:38 PM   Specimen: PATH Cytology CSF; Cerebrospinal Fluid  Result Value Ref Range   Specimen Description CSF    Special Requests NONE    Gram Stain      WBC PRESENT, PREDOMINANTLY MONONUCLEAR NO ORGANISMS SEEN CYTOSPIN SMEAR    Culture      NO GROWTH 2 DAYS Performed at [facility], [address] [place], [place] [address]    Report Status PENDING   Cryptococcal antigen, CSF     Status: None   Collection Time: 06/17/20  3:38 PM  Result Value Ref Range   Crypto Ag NEGATIVE NEGATIVE   Cryptococcal Ag Titer NOT INDICATED NOT INDICATED    Comment: Performed at [facility], [address], [place], [place] [address]  Lyme disease dna by pcr(borrelia burg)     Status: None   Collection Time: 06/17/20  3:38 PM  Result Value Ref Range   Lyme Disease(B.burgdorferi)PCR Negative Negative    Comment: (NOTE) No B. burgdorferi DNA Detected. This test was developed and its performance characteristics determined by [facility].  It has not been cleared or approved by the Food and Drug Administration.  The FDA has determined that such clearance or approval is not necessary. Performed At: [facility] [address] [place], [place] [id number] Jolene Schimke MD EK:[id number]   IgG CSF index     Status: Abnormal   Collection Time: 06/17/20  3:38 PM  Result Value Ref Range   IgG, CSF 14.5 (H) 0.0 - 10.3 mg/dL    Comment: **Results verified by repeat testing**   Albumin CSF-mCnc 38 15 - 55 mg/dL   IgG (Immunoglobin G), Serum 1,385 603 - 1,613 mg/dL   Albumin 2.9 (L) 3.8 - 4.8 g/dL   IgG/Alb Ratio, CSF 5.05 (H) 0.00 -  0.25   CSF IgG Index 0.8 (H) 0.0 - 0.7    Comment: (NOTE) Performed At: [facility] [address] [place], [place] [id number] Jolene Schimke MD WU:[id number]   Expectorated sputum assessment w rflx to resp cult     Status: None   Collection Time: 06/17/20  4:42 PM   Specimen: Expectorated Sputum  Result Value Ref Range   Specimen Description EXPECTORATED SPUTUM    Special Requests NONE    Sputum evaluation      THIS SPECIMEN IS ACCEPTABLE FOR SPUTUM CULTURE Performed at [facility], [address], [place], [place] [address]    Report Status 06/18/2020 FINAL   Culture, respiratory     Status: None (Preliminary result)   Collection Time: 06/17/20  4:42 PM  Result Value Ref Range   Specimen Description EXPECTORATED SPUTUM    Special Requests NONE Reflexed from [id number]    Gram Stain      RARE WBC PRESENT,BOTH PMN AND MONONUCLEAR FEW GRAM VARIABLE ROD RARE GRAM POSITIVE COCCI IN PAIRS Performed at [facility], [address] [place], [place] [address]    Culture PENDING    Report Status PENDING   Antinuclear Antibodies, IFA     Status: None   Collection Time: 06/17/20  5:47 PM  Result Value Ref Range   ANA Ab, IFA Negative     Comment: (NOTE)                                     Negative   <1:80                                     Borderline  1:80                                     Positive   >1:80 ICAP nomenclature: AC-0 For more information about Hep-2 cell patterns use ANApatterns.org, the official website for the  International Consensus on Antinuclear Antibody (ANA) Patterns (ICAP). Performed At: [facility] [address] [place], [place] [id number] Jolene Schimke MD GM:[id number]   ANCA Titers     Status: None   Collection Time: 06/17/20  5:47 PM  Result Value Ref Range   C-ANCA <1:20 Neg:<1:20 titer   P-ANCA <1:20 Neg:<1:20 titer    Comment: (NOTE) The presence of positive fluorescence exhibiting P-ANCA or C-ANCA patterns alone is not specific for the diagnosis of Wegener's Granulomatosis (WG) or microscopic polyangiitis. Decisions about treatment should not be based solely on ANCA IFA results.  The International ANCA Group Consensus recommends follow up testing of positive sera with both PR-3 and MPO-ANCA enzyme immunoassays. As many as 5% serum samples are positive only by EIA. Ref. AM J Clin Pathol 1999;111:507-513.    Atypical P-ANCA titer <1:20 Neg:<1:20 titer    Comment: (NOTE) The atypical pANCA pattern has been observed in a significant percentage of patients with ulcerative colitis, primary sclerosing cholangitis and autoimmune hepatitis. Performed At: [facility] [address] [place], [place] [id number] Jolene Schimke MD ZD:[id number]   Antiextractable Nuclear Ag     Status: None   Collection Time: 06/17/20  5:47 PM  Result Value Ref Range   Ribonucleic Protein <0.2 0.0 - 0.9 AI   ENA  SM Ab Ser-aCnc <0.2 0.0 - 0.9 AI    Comment: (NOTE) Performed At: [facility] [place] [address] [place], [place] [id number] Jolene Schimke MD TF:[id number]   CYCLIC CITRUL PEPTIDE ANTIBODY, IGG/IGA     Status: None   Collection Time: 06/17/20  5:47 PM  Result Value Ref Range   CCP Antibodies IgG/IgA 5 0 - 19 units    Comment: (NOTE)                          Negative               <20                          Weak positive      20 - 39                          Moderate positive  40 - 59                          Strong positive        >59 Performed At: [facility] [address] [place], [place] [id number] Jolene Schimke MD BT:[id number]   Rheumatoid factor     Status: None   Collection Time: 06/17/20  5:47 PM  Result Value Ref Range   Rhuematoid fact SerPl-aCnc 13.9 <14.0 IU/mL    Comment: (NOTE) Performed At: [facility] [address] [place], [place] [id number] Jolene Schimke MD IO:[id number]   HCV Ab Reflex to Quant PCR     Status: Abnormal   Collection Time: 06/17/20  5:47 PM  Result Value Ref Range   HCV Ab >11.0 (H) 0.0 - 0.9 s/co ratio    Comment: (NOTE) Performed At: [facility] [address] [place], [place] [id number] Jolene Schimke MD RC:[id number]   Hepatitis B core antibody, total     Status: Abnormal   Collection Time: 06/17/20  5:47 PM  Result Value Ref Range   Hep B Core Total Ab Reactive (A) NON REACTIVE    Comment: Performed at [facility], [address] [place], [place] [address]  Hepatitis B surface antibody     Status: None   Collection Time: 06/17/20  5:47 PM  Result Value Ref Range   Hepatitis B-Post 566.9 Immunity>9.9 mIU/mL    Comment: (NOTE)  Status of Immunity                     Anti-HBs Level  ------------------                     -------------- Inconsistent with Immunity                   0.0 - 9.9 Consistent with Immunity                          >9.9 Performed At: [facility] [address] [place], [place] [id number] Jolene Schimke MD [id number]   Hepatitis B surface antigen     Status: None   Collection Time: 06/17/20  5:47 PM  Result Value Ref Range   Hepatitis B Surface Ag NON REACTIVE NON REACTIVE    Comment: Performed at [facility], [address] [place], [place] [address]  HCV RT-PCR, Quant (Non-Graph)  Status: None   Collection Time: 06/17/20  5:47 PM  Result Value Ref Range   Hepatitis C Quantitation HCV Not Detected IU/mL   Test Information (HCV): Comment     Comment: (NOTE) The quantitative range of this assay  is 15 IU/mL to 100 million IU/mL.    Interpretation (HCV): Comment     Comment: (NOTE) Positive HCV antibody screen without the presence of HCV RNA is consistent with a resolved past infection or a false positive HCV antibody. Consider repeat testing after one month. Performed At: [facility] [address] [place], [place] [id number] Jolene Schimke MD UX:[id number]   CBC     Status: Abnormal   Collection Time: 06/18/20  6:53 AM  Result Value Ref Range   WBC 15.0 (H) 4.0 - 10.5 K/uL   RBC 2.46 (L) 4.22 - 5.81 MIL/uL   Hemoglobin 7.5 (L) 13.0 - 17.0 g/dL   HCT 53.6 (L) 64.4 - 03.4 %   MCV 99.2 80.0 - 100.0 fL   MCH 30.5 26.0 - 34.0 pg   MCHC 30.7 30.0 - 36.0 g/dL   RDW 74.2 59.5 - 63.8 %   Platelets 440 (H) 150 - 400 K/uL   nRBC 0.0 0.0 - 0.2 %    Comment: Performed at [facility], [address] [place], [place] [address]  Basic metabolic panel     Status: Abnormal   Collection Time: 06/18/20  6:53 AM  Result Value Ref Range   Sodium 134 (L) 135 - 145 mmol/L   Potassium 3.5 3.5 - 5.1 mmol/L   Chloride 98 98 - 111 mmol/L   CO2 24 22 - 32 mmol/L   Glucose, Bld 103 (H) 70 - 99 mg/dL    Comment: Glucose reference range applies only to samples taken after fasting for at least 8 hours.   BUN 13 8 - 23 mg/dL   Creatinine, Ser 3.29 0.61 - 1.24 mg/dL   Calcium 8.2 (L) 8.9 - 10.3 mg/dL   GFR, Estimated >51 >88 mL/min    Comment: (NOTE) Calculated using the CKD-EPI Creatinine Equation (2021)    Anion gap 12 5 - 15    Comment: Performed at [facility], [address], [place], [place] [address]  Hemoglobin A1c     Status: Abnormal   Collection Time: 06/18/20  6:53 AM  Result Value Ref Range   Hgb A1c MFr Bld 4.6 (L) 4.8 - 5.6 %    Comment: (NOTE) Pre diabetes:          5.7%-6.4%  Diabetes:              >6.4%  Glycemic control for   <7.0% adults with diabetes    Mean Plasma Glucose 85.32 mg/dL    Comment: Performed at [facility], [address] [place], [place] [address]  Glucose, capillary     Status: Abnormal   Collection Time: 06/18/20 11:46 AM  Result Value Ref Range   Glucose-Capillary 217 (H) 70 - 99 mg/dL    Comment: Glucose reference range applies only to samples taken after fasting for at least 8 hours.  Sodium, urine, random     Status: None   Collection Time: 06/18/20  1:07 PM  Result Value Ref Range   Sodium, Ur 26 mmol/L    Comment: Performed at [facility], [address], [place], [place] [address]  Osmolality, urine     Status: None   Collection Time: 06/18/20  1:07  PM  Result Value Ref Range   Osmolality, Ur 321 300 - 900 mOsm/kg    Comment: Performed at [facility], [address], [place], [place] [address]  Cryptococcal antigen     Status: None   Collection Time: 06/18/20  4:33 PM  Result Value Ref Range   Crypto Ag NEGATIVE NEGATIVE   Cryptococcal Ag Titer NOT INDICATED NOT INDICATED    Comment: Performed at [facility], [address] [place], [place] [address]  Type and screen [facility]     Status: None   Collection Time: 06/18/20  4:33 PM  Result Value Ref Range   ABO/RH(D) A POS    Antibody Screen NEG    Sample Expiration      06/21/2020,2359 Performed at [facility], [address] [place], [place] [address]   Glucose, capillary     Status: Abnormal   Collection Time: 06/18/20  4:57 PM  Result Value Ref Range   Glucose-Capillary 100 (H) 70 - 99 mg/dL    Comment: Glucose reference range applies only to samples taken after fasting for at least 8 hours.  CBC     Status: Abnormal   Collection Time: 06/19/20  2:58 AM  Result Value Ref Range   WBC 15.2 (H) 4.0 - 10.5 K/uL   RBC 2.47 (L) 4.22 - 5.81 MIL/uL   Hemoglobin 7.6 (L) 13.0 - 17.0 g/dL   HCT 21.2 (L) 24.8 - 25.0 %   MCV 99.2 80.0 - 100.0 fL   MCH 30.8 26.0 - 34.0 pg   MCHC 31.0 30.0 - 36.0 g/dL   RDW 03.7 04.8 - 88.9 %   Platelets 467 (H) 150 - 400 K/uL   nRBC 0.1 0.0 - 0.2 %     Comment: Performed at [facility], [address], [place], [place] [address]  Basic metabolic panel     Status: Abnormal   Collection Time: 06/19/20  2:58 AM  Result Value Ref Range   Sodium 132 (L) 135 - 145 mmol/L   Potassium 3.7 3.5 - 5.1 mmol/L   Chloride 97 (L) 98 - 111 mmol/L   CO2 24 22 - 32 mmol/L   Glucose, Bld 109 (H) 70 - 99 mg/dL    Comment: Glucose reference range applies only to samples taken after fasting for at least 8 hours.   BUN 11 8 - 23 mg/dL   Creatinine, Ser 0.38 0.61 - 1.24 mg/dL   Calcium 8.4 (L) 8.9 - 10.3 mg/dL   GFR, Estimated >88 >28 mL/min    Comment: (NOTE) Calculated using the CKD-EPI Creatinine Equation (2021)    Anion gap 11 5 - 15    Comment: Performed at [facility], [address], [place], [place] [address]    DG FLUORO GUIDE LUMBAR PUNCTURE  Result Date: 06/17/2020 CLINICAL DATA:  Optic neuritis. EXAM: DIAGNOSTIC LUMBAR PUNCTURE UNDER FLUOROSCOPIC GUIDANCE FLUOROSCOPY TIME:  Fluoroscopy Time:  54 seconds Radiation Exposure Index (if provided by the fluoroscopic device): 18.8 mGy Number of Acquired Spot Images: None PROCEDURE: Informed consent was obtained from the patient prior to the procedure with a discussion of potential complications including but not limited to headache, allergy, infection, pain, spinal cord/nerve root damage. With the patient prone, the lower back was prepped with Betadine. 1% Lidocaine was used for local anesthesia. Lumbar puncture was performed at the L4 laminectomy defect using a 20 gauge needle with return of clear CSF. 6 ml of  CSF could be obtained for laboratory studies. The patient tolerated the procedure well without immediate post-procedure complication. IMPRESSION: Fluoroscopically-guided lumbar puncture performed at the L4 level. 6 mL of CSF could be obtained and sent for laboratory studies. The patient tolerated the procedure well without immediate post-procedure complication. Electronically Signed   By:  Jackey Loge DO   On: 06/17/2020 15:18    Review of Systems  Eyes: Positive for visual disturbance.  Respiratory: Positive for cough.   All other systems reviewed and are negative.  Blood pressure (!) 119/97, pulse 90, temperature 98.1 F (36.7 C), temperature source Oral, resp. rate 19, height 6\' 1"  (1.854 m), weight 88.9 kg, SpO2 96 %. Physical Exam Vitals reviewed.  Constitutional:      General: He is not in acute distress.    Appearance: Normal appearance. He is normal weight. He is not ill-appearing, toxic-appearing or diaphoretic.  HENT:     Head: Normocephalic and atraumatic.  Eyes:     Extraocular Movements:     Right eye: Abnormal extraocular motion present.     Conjunctiva/sclera: Conjunctivae normal.   Neurological:     Mental Status: He is alert.     Assessment/Plan: 1. Decreased vision OS:  [name] [name] is a 65 year old with a history of L exotropia/ L strabysmus / L orbital fracture. In addition there are findings consistent with a L old central retinal vein occlusion. There is no evidence of ocular inflammation or infection. Outpatient general eye care is advised. Drug counseling advised.  2. Cavitary Pneumonia: per ID.   Carigan Lister B 06/19/2020, 9:31 AM

## 2020-06-19 NOTE — Progress Notes (Incomplete)
Patient c/o arthritic pain on rt ankle. Warm compress given as requested but patient said there's no relief, patient requests for [place]. Text paged MD on call.

## 2020-06-19 NOTE — Plan of Care (Signed)
  Problem: Education: Goal: Knowledge of General Education information will improve Description Including pain rating scale, medication(s)/side effects and non-pharmacologic comfort measures Outcome: Progressing   

## 2020-06-20 ENCOUNTER — Ambulatory Visit: Payer: Medicaid Other | Admitting: Neurology

## 2020-06-20 DIAGNOSIS — J851 Abscess of lung with pneumonia: Secondary | ICD-10-CM | POA: Diagnosis not present

## 2020-06-20 DIAGNOSIS — H5460 Unqualified visual loss, one eye, unspecified: Secondary | ICD-10-CM | POA: Diagnosis not present

## 2020-06-20 DIAGNOSIS — J984 Other disorders of lung: Secondary | ICD-10-CM | POA: Diagnosis not present

## 2020-06-20 DIAGNOSIS — J189 Pneumonia, unspecified organism: Secondary | ICD-10-CM | POA: Diagnosis not present

## 2020-06-20 DIAGNOSIS — H469 Unspecified optic neuritis: Secondary | ICD-10-CM | POA: Diagnosis not present

## 2020-06-20 DIAGNOSIS — D649 Anemia, unspecified: Secondary | ICD-10-CM | POA: Diagnosis not present

## 2020-06-20 LAB — CULTURE, BLOOD (ROUTINE X 2)
Culture: NO GROWTH
Culture: NO GROWTH
Special Requests: ADEQUATE
Special Requests: ADEQUATE

## 2020-06-20 LAB — CULTURE, RESPIRATORY W GRAM STAIN: Culture: NORMAL

## 2020-06-20 LAB — CSF CULTURE W GRAM STAIN: Culture: NO GROWTH

## 2020-06-20 LAB — CBC
HCT: 24.3 % — ABNORMAL LOW (ref 39.0–52.0)
Hemoglobin: 7.6 g/dL — ABNORMAL LOW (ref 13.0–17.0)
MCH: 30.8 pg (ref 26.0–34.0)
MCHC: 31.3 g/dL (ref 30.0–36.0)
MCV: 98.4 fL (ref 80.0–100.0)
Platelets: 534 10*3/uL — ABNORMAL HIGH (ref 150–400)
RBC: 2.47 MIL/uL — ABNORMAL LOW (ref 4.22–5.81)
RDW: 13.8 % (ref 11.5–15.5)
WBC: 13.2 10*3/uL — ABNORMAL HIGH (ref 4.0–10.5)
nRBC: 0.2 % (ref 0.0–0.2)

## 2020-06-20 LAB — BASIC METABOLIC PANEL
Anion gap: 11 (ref 5–15)
BUN: 8 mg/dL (ref 8–23)
CO2: 25 mmol/L (ref 22–32)
Calcium: 8.6 mg/dL — ABNORMAL LOW (ref 8.9–10.3)
Chloride: 99 mmol/L (ref 98–111)
Creatinine, Ser: 1.01 mg/dL (ref 0.61–1.24)
GFR, Estimated: >60 mL/min (ref >60)
Glucose, Bld: 100 mg/dL — ABNORMAL HIGH (ref 70–99)
Potassium: 4.1 mmol/L (ref 3.5–5.1)
Sodium: 135 mmol/L (ref 135–145)

## 2020-06-20 LAB — SURGICAL PATHOLOGY

## 2020-06-20 LAB — CYTOLOGY - NON PAP

## 2020-06-20 NOTE — Plan of Care (Signed)
  Problem: Education: Goal: Knowledge of General Education information will improve Description Including pain rating scale, medication(s)/side effects and non-pharmacologic comfort measures Outcome: Progressing   

## 2020-06-20 NOTE — Progress Notes (Addendum)
[facility]  Date of Admission:  06/14/2020   Total days of antibiotics 6        Day 3 Augmentin         ASSESSMENT: RLL cavitary pneumonia  Positive IGRA Left optic neuritis   PLAN: 1. RLL cavitary pna- Bronch completed yesterday, awaiting results. BAL showed few wbs predominately PMN and rare gram + cocci in clusters. Afebrile, leukocytosis continues to downtrend. F/u afb sputum cx, afb sputum rifampin/mtb pcr, fungal serology, csf mtb pcr, csf afb cx. Considerations still include TB, although less likely, vs bacterial or mycobacterial pna vs aspiration pna or lung abscess. Continue augmentin for 4 weeks. Will need follow up outpatient.  2. Left optic neuritis- Ophthalmology states findings consistent with old central retinal vein occlusion. Will need neurology follow up, possibly related to cavitary pna vs covid vaccine side effect.    Principal Problem:   Cavitary pneumonia Active Problems:   Necrotizing pneumonia (HCC)   Monocular vision loss   Scheduled Meds: . amoxicillin-clavulanate  1 tablet Oral Q12H  . aspirin EC  81 mg Oral Daily  . enoxaparin (LOVENOX) injection  40 mg Subcutaneous Q24H  . folic acid  1 mg Oral Daily  . multivitamin with minerals  1 tablet Oral Daily  . thiamine  100 mg Oral Daily   Or  . thiamine  100 mg Intravenous Daily   Continuous Infusions: PRN Meds:.acetaminophen **OR** acetaminophen, diclofenac Sodium, guaiFENesin-dextromethorphan, Muscle Rub   SUBJECTIVE: Patient reports feeling depressed this morning. He is depressed because no one is able to tell him what is going on. He is eager to go home. Denies any n/v/d, itching, rash. States his vision has improved. Continues to have a cough.    Review of Systems: Review of Systems  Constitutional: Negative for chills, diaphoresis, fever and malaise/fatigue.  Respiratory: Positive for cough and sputum production. Negative for shortness of breath.    Gastrointestinal: Negative for abdominal pain, diarrhea, nausea and vomiting.  Musculoskeletal: Negative for joint pain.  Skin: Negative for itching and rash.  Neurological: Negative for weakness and headaches.    Allergies  Allergen Reactions  . Hctz [Hydrochlorothiazide]     Erectile dysfunction    OBJECTIVE: Vitals:   06/19/20 1621 06/19/20 2012 06/20/20 0359 06/20/20 0900  BP: 111/63 107/70 98/64 102/62  Pulse: 72 86 66 62  Resp: 18   18  Temp: 98.7 F (37.1 C) 98.2 F (36.8 C) 98.6 F (37 C) 99.3 F (37.4 C)  TempSrc: Oral Oral  Oral  SpO2: 95% 95% 95% 93%  Weight:  82.9 kg    Height:       Body mass index is 24.11 kg/m.  Physical Exam Vitals reviewed.  Constitutional:      General: He is not in acute distress.    Appearance: Normal appearance. He is not ill-appearing.  HENT:     Head: Normocephalic and atraumatic.  Cardiovascular:     Rate and Rhythm: Normal rate and regular rhythm.     Heart sounds: No murmur heard. No friction rub. No gallop.   Pulmonary:     Effort: Pulmonary effort is normal.     Breath sounds: Rales present. No wheezing or rhonchi.  Abdominal:     General: Abdomen is flat. Bowel sounds are normal. There is no distension.     Palpations: Abdomen is soft.     Tenderness: There is no abdominal tenderness. There is no guarding.  Musculoskeletal:        General: No swelling or tenderness.     Right lower leg: No edema.     Left lower leg: No edema.  Skin:    General: Skin is warm and dry.     Findings: No lesion or rash.  Neurological:     Mental Status: He is alert and oriented to person, place, and time.  Psychiatric:        Mood and Affect: Mood normal.        Behavior: Behavior normal.        Thought Content: Thought content normal.        Judgment: Judgment normal.     Lab Results Lab Results  Component Value Date   WBC 13.2 (H) 06/20/2020   HGB 7.6 (L) 06/20/2020   HCT 24.3 (L) 06/20/2020   MCV 98.4 06/20/2020    PLT 534 (H) 06/20/2020    Lab Results  Component Value Date   CREATININE 1.01 06/20/2020   BUN 8 06/20/2020   NA 135 06/20/2020   K 4.1 06/20/2020   CL 99 06/20/2020   CO2 25 06/20/2020    Lab Results  Component Value Date   ALT 26 06/14/2020   AST 31 06/14/2020   ALKPHOS 69 06/14/2020   BILITOT 1.0 06/14/2020     Microbiology: Recent Results (from the past 240 hour(s))  Resp Panel by RT-PCR (Flu A&B, Covid)     Status: None   Collection Time: 06/14/20  2:04 AM  Result Value Ref Range Status   SARS Coronavirus 2 by RT PCR NEGATIVE NEGATIVE Final    Comment: (NOTE) SARS-CoV-2 target nucleic acids are NOT DETECTED.  The SARS-CoV-2 RNA is generally detectable in upper respiratory specimens during the acute phase of infection. The lowest concentration of SARS-CoV-2 viral copies this assay can detect is 138 copies/mL. A negative result does not preclude SARS-Cov-2 infection and should not be used as the sole basis for treatment or other patient management decisions. A negative result may occur with  improper specimen collection/handling, submission of specimen other than nasopharyngeal swab, presence of viral mutation(s) within the areas targeted by this assay, and inadequate number of viral copies(<138 copies/mL). A negative result must be combined with clinical observations, patient history, and epidemiological information. The expected result is Negative.  Fact Sheet for Patients:  EntrepreneurPulse.com.au  Fact Sheet for Healthcare Providers:  IncredibleEmployment.be  This test is no t yet approved or cleared by the [place] FDA and  has been authorized for detection and/or diagnosis of SARS-CoV-2 by FDA under an Emergency Use Authorization (EUA). This EUA will remain  in effect (meaning this test can be used) for the duration of the COVID-19 declaration under Section 564(b)(1) of the Act, 21 U.S.C.section 360bbb-3(b)(1),  unless the authorization is terminated  or revoked sooner.       Influenza A by PCR NEGATIVE NEGATIVE Final   Influenza B by PCR NEGATIVE NEGATIVE Final    Comment: (NOTE) The Xpert Xpress SARS-CoV-2/FLU/RSV plus assay is intended as an aid in the diagnosis of influenza from Nasopharyngeal swab specimens and should not be used as a sole basis for treatment. Nasal washings and aspirates are unacceptable for Xpert Xpress SARS-CoV-2/FLU/RSV testing.  Fact Sheet for Patients: EntrepreneurPulse.com.au  Fact Sheet for Healthcare Providers: IncredibleEmployment.be  This test is not yet approved or cleared by the [place] FDA and has been authorized for detection and/or diagnosis of SARS-CoV-2 by FDA under an Emergency Use Authorization (EUA). This  EUA will remain in effect (meaning this test can be used) for the duration of the COVID-19 declaration under Section 564(b)(1) of the Act, 21 U.S.C. section 360bbb-3(b)(1), unless the authorization is terminated or revoked.  Performed at [facility], [place] [address] [place], [place] [address]   SARS Coronavirus 2 by RT PCR (hospital order, performed in [facility] hospital lab) Nasopharyngeal Nasopharyngeal Swab     Status: None   Collection Time: 06/14/20  3:44 PM   Specimen: Nasopharyngeal Swab  Result Value Ref Range Status   SARS Coronavirus 2 NEGATIVE NEGATIVE Final    Comment: (NOTE) SARS-CoV-2 target nucleic acids are NOT DETECTED.  The SARS-CoV-2 RNA is generally detectable in upper and lower respiratory specimens during the acute phase of infection. The lowest concentration of SARS-CoV-2 viral copies this assay can detect is 250 copies / mL. A negative result does not preclude SARS-CoV-2 infection and should not be used as the sole basis for treatment or other patient management decisions.  A negative result may occur with improper specimen collection / handling, submission of  specimen other than nasopharyngeal swab, presence of viral mutation(s) within the areas targeted by this assay, and inadequate number of viral copies (<250 copies / mL). A negative result must be combined with clinical observations, patient history, and epidemiological information.  Fact Sheet for Patients:   StrictlyIdeas.no  Fact Sheet for Healthcare Providers: BankingDealers.co.za  This test is not yet approved or  cleared by the [place] FDA and has been authorized for detection and/or diagnosis of SARS-CoV-2 by FDA under an Emergency Use Authorization (EUA).  This EUA will remain in effect (meaning this test can be used) for the duration of the COVID-19 declaration under Section 564(b)(1) of the Act, 21 U.S.C. section 360bbb-3(b)(1), unless the authorization is terminated or revoked sooner.  Performed at [facility], [place] [address], [place], [place] [address]   Culture, blood (routine x 2) Call MD if unable to obtain prior to antibiotics being given     Status: None   Collection Time: 06/15/20  1:51 PM   Specimen: BLOOD  Result Value Ref Range Status   Specimen Description BLOOD LEFT ANTECUBITAL  Final   Special Requests   Final    BOTTLES DRAWN AEROBIC AND ANAEROBIC Blood Culture adequate volume   Culture   Final    NO GROWTH 5 DAYS Performed at [facility], [place] [address] [place], [place] [address]    Report Status 06/20/2020 FINAL  Final  Culture, blood (routine x 2) Call MD if unable to obtain prior to antibiotics being given     Status: None   Collection Time: 06/15/20  1:52 PM   Specimen: BLOOD RIGHT HAND  Result Value Ref Range Status   Specimen Description BLOOD RIGHT HAND  Final   Special Requests   Final    BOTTLES DRAWN AEROBIC AND ANAEROBIC Blood Culture adequate volume   Culture   Final    NO GROWTH 5 DAYS Performed at [facility], [place] [address] [place], [place] [address]     Report Status 06/20/2020 FINAL  Final  MRSA PCR Screening     Status: None   Collection Time: 06/15/20  8:11 PM   Specimen: Nasal Mucosa; Nasopharyngeal  Result Value Ref Range Status   MRSA by PCR NEGATIVE NEGATIVE Final    Comment:        The GeneXpert MRSA Assay (FDA approved for NASAL specimens only), is one  component of a comprehensive MRSA colonization surveillance program. It is not intended to diagnose MRSA infection nor to guide or monitor treatment for MRSA infections. Performed at [facility], [place] [address], [place], [place] [address]   Acid Fast Smear (AFB)     Status: None   Collection Time: 06/17/20 10:45 AM   Specimen: Sputum  Result Value Ref Range Status   AFB Specimen Processing Concentration  Final   Acid Fast Smear Negative  Final    Comment: (NOTE) Performed At: [facility] [place], [place] [id number] Rush Farmer MD JG:[id number]    Source (AFB) SPU  Final    Comment: Performed at [facility], [place] [address], [place], [place] [address]  Acid Fast Smear (AFB)     Status: None   Collection Time: 06/17/20 10:45 AM   Specimen: CSF  Result Value Ref Range Status   AFB Specimen Processing Concentration  Final   Acid Fast Smear Negative  Final    Comment: (NOTE) Performed At: [facility] [address] [place], [place] [id number] Rush Farmer MD XN:[id number]    Source (AFB) SPU  Final    Comment: Performed at [facility], [place] [address] [place], [place] [address]  CSF culture     Status: None (Preliminary result)   Collection Time: 06/17/20  3:38 PM   Specimen: PATH Cytology CSF; Cerebrospinal Fluid  Result Value Ref Range Status   Specimen Description CSF  Final   Special Requests NONE  Final   Gram Stain   Final    WBC PRESENT, PREDOMINANTLY MONONUCLEAR NO ORGANISMS SEEN CYTOSPIN SMEAR    Culture   Final    NO GROWTH 2 DAYS Performed at [facility], [place] [address] [place] [address],  [place], [place] [address]    Report Status PENDING  Incomplete  Expectorated sputum assessment w rflx to resp cult     Status: None   Collection Time: 06/17/20  4:42 PM   Specimen: Expectorated Sputum  Result Value Ref Range Status   Specimen Description EXPECTORATED SPUTUM  Final   Special Requests NONE  Final   Sputum evaluation   Final    THIS SPECIMEN IS ACCEPTABLE FOR SPUTUM CULTURE Performed at [facility], [place] [address] [place], [place] [address]    Report Status 06/18/2020 FINAL  Final  Culture, respiratory     Status: None   Collection Time: 06/17/20  4:42 PM  Result Value Ref Range Status   Specimen Description EXPECTORATED SPUTUM  Final   Special Requests NONE Reflexed from [id number]  Final   Gram Stain   Final    RARE WBC PRESENT,BOTH PMN AND MONONUCLEAR FEW GRAM VARIABLE ROD RARE GRAM POSITIVE COCCI IN PAIRS    Culture   Final    Normal respiratory flora-no Staph aureus or Pseudomonas seen Performed at [facility], [place] [address], [place], [place] [address]    Report Status 06/20/2020 FINAL  Final  Culture, respiratory     Status: None (Preliminary result)   Collection Time: 06/19/20  3:15 PM   Specimen: Bronchial Alveolar Lavage; Respiratory  Result Value Ref Range Status   Specimen Description BRONCHIAL ALVEOLAR LAVAGE  Final   Special Requests NONE  Final   Gram Stain   Final    FEW WBC PRESENT, PREDOMINANTLY PMN RARE GRAM POSITIVE COCCI IN CLUSTERS    Culture   Final    CULTURE REINCUBATED FOR BETTER GROWTH Performed at  [name], [place] [address], [place], [place] [address]    Report Status PENDING  Incomplete    Areeg Dutch Quint, DO PGY-2 [facility] 06/20/2020, 10:16 AM

## 2020-06-20 NOTE — Evaluation (Signed)
Physical Therapy Evaluation Patient Details Name: [name] [name] MRN: [id number] DOB: June 11, 1955 Today's Date: 06/20/2020   History of Present Illness  65yo male c/o fever and cough, found to have RLL PNA with cavitation and concern for necrotizing PNA on imaging. PMH HTN, gout, back surgery  Clinical Impression   Patient received in bed, repeatedly tells me he is fine and "I feel better than I did before I came to the hospital, I don't need to be here I want to go put the brakes on my car, if the doctor doesn't come soon I'm walking out of here". Took quite a bit of convincing to get him to try standing with me and and then had strong offloading of R ankle/impaired balance but refused walking in room. Repeatedly tells me he feels fine and he does not need PT/does not see a need for [place] to return as he has no concerns about his mobility. Seems to have very poor insight into general health status and safety/deficits overall. Left sitting at EOB with all needs met, RN aware of patient status. Ideally we would continue to work with him however he repeatedly adamantly refuses PT returning to work with him- will sign off for now, please reconsult if needed.     Follow Up Recommendations Home health PT    Equipment Recommendations  Cane    Recommendations for Other Services       Precautions / Restrictions Precautions Precautions: Other (comment) Precaution Comments: L eye vision loss, poor insight into safety and deficits Restrictions Weight Bearing Restrictions: No      Mobility  Bed Mobility Overal bed mobility: Independent                  Transfers Overall transfer level: Modified independent Equipment used: None             General transfer comment: able to ge  to standing with significant weight off shift to the left due to R ankle pain, flexed trunk, refused gait training  Ambulation/Gait             General Gait Details: refused  Stairs             Wheelchair Mobility    Modified Rankin (Stroke Patients Only)       Balance Overall balance assessment: Needs assistance Sitting-balance support: Feet supported Sitting balance-Leahy Scale: Normal     Standing balance support: Single extremity supported;During functional activity Standing balance-Leahy Scale: Poor Standing balance comment: offloading R ankle and shifting weight to the left, reaching out for frame of bed in static standing                             Pertinent Vitals/Pain Pain Assessment: Faces Faces Pain Scale: Hurts whole lot Pain Location: R ankle arthritis pain Pain Descriptors / Indicators: Aching;Sore Pain Intervention(s): Limited activity within patient's tolerance;Monitored during session    [place] expects to be discharged to:: Private residence Living Arrangements: Alone Available Help at Discharge: Friend(s);Available PRN/intermittently Type of Home: Apartment Home Access: Level entry     Home Layout: One level Home Equipment: None      Prior Function Level of Independence: Independent               Hand Dominance        Extremity/Trunk Assessment   Upper Extremity Assessment Upper Extremity Assessment: Generalized weakness    Lower Extremity Assessment Lower Extremity Assessment: Generalized  weakness    Cervical / Trunk Assessment Cervical / Trunk Assessment: Normal  Communication   Communication: No difficulties  Cognition Arousal/Alertness: Awake/alert Behavior During Therapy: Impulsive Overall Cognitive Status: No family/caregiver present to determine baseline cognitive functioning Area of Impairment: Safety/judgement;Problem solving;Awareness                         Safety/Judgement: Decreased awareness of safety;Decreased awareness of deficits Awareness: Intellectual Problem Solving: Slow processing;Decreased initiation;Difficulty sequencing General Comments:  impulsive but question if this is his baseline- blames his ankle pain on not moving around as much as he does at home, impulsively stood up and continues to report that "I am fine I am fine, I"m better than when I came in here". Poor insight into deficits ands safety, seems very health illiterate overall      General Comments      Exercises     Assessment/Plan    PT Assessment Patient needs continued PT services  PT Problem List Decreased strength;Decreased activity tolerance;Decreased safety awareness;Decreased balance;Decreased mobility;Pain;Decreased coordination       PT Treatment Interventions DME instruction;Balance training;Gait training;Stair training;Functional mobility training;Patient/family education;Cognitive remediation;Therapeutic activities;Therapeutic exercise    PT Goals (Current goals can be found in the Care Plan section)  Acute Rehab PT Goals Patient Stated Goal: go home today- "I'm going to leave within the next hour if the doctor doesn't come" PT Goal Formulation: With patient Time For Goal Achievement: 07/04/20 Potential to Achieve Goals: Fair    Frequency Min 3X/week   Barriers to discharge        Co-evaluation               AM-PAC PT "6 Clicks" Mobility  Outcome Measure Help needed turning from your back to your side while in a flat bed without using bedrails?: None Help needed moving from lying on your back to sitting on the side of a flat bed without using bedrails?: None Help needed moving to and from a bed to a chair (including a wheelchair)?: A Little Help needed standing up from a chair using your arms (e.g., wheelchair or bedside chair)?: A Little Help needed to walk in hospital room?: A Little Help needed climbing 3-5 steps with a railing? : A Lot 6 Click Score: 19    End of Session   Activity Tolerance: Patient limited by pain Patient left: in bed;with call bell/phone within reach Nurse Communication: Mobility status PT Visit  Diagnosis: Unsteadiness on feet (R26.81);Difficulty in walking, not elsewhere classified (R26.2);Pain Pain - Right/Left: Right Pain - part of body: Ankle and joints of foot    Time: 2637-8588 PT Time Calculation (min) (ACUTE ONLY): 8 min   Charges:   PT Evaluation $PT Eval Moderate Complexity: 1 Mod          Windell Norfolk, DPT, PN1   Supplemental Physical Therapist [place]    Pager [phone] Acute Rehab Office [phone]

## 2020-06-20 NOTE — Progress Notes (Signed)
Name: [name] [name] MRN: [id number] DOB: 12/20/1955    ADMISSION DATE:  06/14/2020 CONSULTATION DATE:  06/20/2020   REFERRING MD :  Neena Rhymes  CHIEF COMPLAINT: Pneumonia, vision loss   HISTORY OF PRESENT ILLNESS: 65 year old smoker for whom we are consulted to help evaluation of right lower lobe cavitary pneumonia and left visual loss possibly optic neuritis. He was admitted 2/6 complaining of 1 to 2 weeks of cough productive of "cotton-wool" like phlegm, fevers shortness of breath and right pleuritic chest pain.  He reports at least one episode of blood in sputum.  He tells me today that these symptoms have been ongoing for a few days but admission H&P reports 2 weeks of symptoms.  He also reported vision loss in his left eye which appears to have been gradually progressive since October 1 and he correlates this to after receiving second Covid vaccine.  He was found to have leukocytosis with WBC 16.8, febrile to 102, CT angiogram confirmed right lower lobe lobar pneumonia with evidence of early cavitation and reactive right mediastinal and right hilar lymph nodes  He was initially treated with broad-spectrum antibiotics. Urology was consulted, MRI showed left optic nerve enhancement concerning for optic neuritis and multiple old infarcts including in the pons.  CSF analysis only showed slight high protein of 80.  High-dose steroids was recommended but due to concern for tuberculosis in the setting of cavitary pneumonia, this was deferred. Infectious disease was consulted .  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 Mild-to-moderate multifocal T2/FLAIR hyperintensity within the cerebral white matter, basal ganglia and pons. Findings arenonspecific but likely at least partially reflect chronic small vessel ischemic disease. Sequela of a second superimposed process (i.e. demyelinating, infectious/inflammatory) cannot be excluded.4. Chronic pontine lacunar infarcts. majority of theleft optic nerve with associated abnormal enhancement. Findingslikely reflect sequela of optic neuritis/neuropathy (2/10 Ophthal   Consult >> evidence of old left CRV O  SUBJECTIVE:   Breathing much improved.    VITAL SIGNS: Temp:  [98.2 F (36.8 C)-99.3 F (37.4 C)] 99.3 F (37.4 C) (02/11 0900) Pulse Rate:  [62-105] 62 (02/11 0900) Resp:  [15-26] 18 (02/11 0900) BP: (96-167)/(53-97) 102/62 (02/11 0900) SpO2:  [93 %-100 %] 93 % (02/11 0900) Weight:  [82.9 kg] 82.9 kg (02/10 2012)  PHYSICAL EXAMINATION:  General resting in bed no distress HENT NCAT no JVD s Lungs clear dec a little in right base Card rrr abd soft  Ext warm and dry  Neuro intact     ASSESSMENT / PLAN:    Right lower lobe necrotizing pneumonia -includes bacterial, mycobacterial process and less commonly other organisms.  Aspiration also a possibility but he does not seem to have any risk factors, he denies alcohol and drug use currently.   History of his respiratory symptoms are acute so I really doubt  tuberculosis here Visual loss seems to be related to old left CRV O, not clear if optic neuritis is still a consideration -s/p bronch 2/10: Post bronchoscopy chest x-ray reviewed.  Comparing previous film  slightly worse consolidation right base, right greater than left airspace disease persists some of this may be post bronchoscopy related Plan Follow-up results of BAL including: Fungal culture, AFB, cytology and pathology We will arrange office follow-up in place in chart Currently plan is for 4 weeks of Augmentin  We will be available as needed over the weekend, disposition in regards to discharge deferred to primary team from our standpoint ready to go home as early as today   06/20/2020, 10:49 AM

## 2020-06-20 NOTE — Progress Notes (Addendum)
Subjective:  Patient reports feeling much improved from admission. Desires to return to apartment to check on his pet and do laundry if possible, okay with returning to hospital for further workup. Discussed positive quantiferon result paired with negative PPD, and need for further workup on TB in light of potential for primary progressive infection. Also discussed active chronic optic neuritis per imaging; and old retinal vein occlusion.   Objective:  Vital signs in last 24 hours: Vitals:   06/19/20 1621 06/19/20 2012 06/20/20 0359 06/20/20 0900  BP: 111/63 107/70 98/64 102/62  Pulse: 72 86 66 62  Resp: 18   18  Temp: 98.7 F (37.1 C) 98.2 F (36.8 C) 98.6 F (37 C) 99.3 F (37.4 C)  TempSrc: Oral Oral  Oral  SpO2: 95% 95% 95% 93%  Weight:  82.9 kg    Height:       Weight change:   Intake/Output Summary (Last 24 hours) at 06/20/2020 1442 Last data filed at 06/20/2020 1100 Gross per 24 hour  Intake 1260 ml  Output 1500 ml  Net -240 ml    Physical Exam Constitutional:      General: He is not in acute distress.    Appearance: He is not ill-appearing, toxic-appearing or diaphoretic.     Comments: Patient seen lying in bed, NAD.   Cardiovascular:     Rate and Rhythm: Normal rate and regular rhythm.  Pulmonary:     Effort: Pulmonary effort is normal. No respiratory distress.     Breath sounds: Rales present.  Neurological:     Mental Status: He is alert.  Psychiatric:        Mood and Affect: Mood normal.        Behavior: Behavior normal.     Assessment/Plan: Cleared Hep B and Hep C  Principal Problem:   Cavitary pneumonia Active Problems:   Necrotizing pneumonia (HCC)   Monocular vision loss   Abscess of lung with pneumonia ([place])   Cavitary RLL Pneumonia Patient presenting with two weeks of productive cough, tactile fevers, and chills. Labs significant for leukocytosis with left shift, that is downtrending. Contrasted CTA showed RLL pneumonia with  consolidation and potential early cavitation, concern for necrotizing pneumonia; and reactive lymph nodes; no pleural effusion. 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 MRSA PCR negative. Blood cultures without growth to date. BAL culture with Neutrophils and rare Gram (+) cocci in clusters at this time.      *Appreciate Pulmonology recs:   -Bronchoscopy yesterday      -f/u BAL AFB & transbronchial biopsies   -Okay to discharge when able, with f/u Pulm appointment to discuss results *ID recs:    -Day 3 Augmentin: plan for 4 weeks *Appreciate PT   -Patient refused; signed off with recs for home health PT -F/u blood cultures  -Airborne & Droplet precautions -TB work up pending   -Robitussin PRN -Tylenol PRN -Trend CBC      Optic Neuritis Monocular Vision Loss Chronic pontine lacunar infarctions and cortical infarctions MRI with evidence of optic neuritis involving the entire left optic nerve as well as questions of possible demyelinating, infectious, or inflammatory sequela within the cerebral white matter, basal ganglia, and pons. On exam his left pupil is fixed and non reactive. Also with left cranial nerve III palsy, and chronic dyscongugate gaze. Patient reporting hx of strabismus surgery in childhood. ESR & CRP elevated, in the context of infection. Concern for potential  Tuberculosis optic neuropathy. 6 mL CSF obtained. CSF analysis showed normal glucose of 56, and protein elevation to 80; with no organisms detected. Does not rule out TB involvement. Elevated CSF protein; CSF IgG elevated at 14.5. Quantiferon gold on 2/8 positive while PPD negative; indicating potential for primary progressive TB. AFB sputum smear negative thus far.     *Ophtalmology following, appreciate recs  -Findings consistent with old central retinal vein occlusion   -outpatient general eye care      *ID following, appreciate  recs -Await Bronch results, (+) Quantiferon gold raises potential for TB etiology (maybe recent conversion, with subsequent primary progressive TB) -Okay to d/c if last AFB negative, and f/u outpatient; as patient desires to leave -for respiratory isolation: hope to get rifampin/mtb pcr back   -if negative, take out of isolation and f/u in ID clinic for final AFB culture/other fungal serology -immunosuppression per Neurology     *Neurology following, appreciate recs: -Will see tomorrow, f/u recs -ASA 81 prophylaxis    Normocytic Anemia No recent labs to establish baseline. Normal colonoscopy in 2015. No evidence of active bleeding however hgb 10>8.9 since admission. on studies consistent with anemia of inflammation (low TIBC and saturation, high ferritin). Normal B12 and folate. Previously stable at 8.4; significant drop to 7.5. Likely some iatrogenic contribution from lab draws. Has remained stable at 7.6.    -trend hgb -Transfuse for <7 -continue to monitor    LOS: 5 days   Azell Der, Medical Student 06/20/2020, 2:42 PM

## 2020-06-21 DIAGNOSIS — J984 Other disorders of lung: Secondary | ICD-10-CM | POA: Diagnosis not present

## 2020-06-21 DIAGNOSIS — J189 Pneumonia, unspecified organism: Secondary | ICD-10-CM | POA: Diagnosis not present

## 2020-06-21 DIAGNOSIS — H5460 Unqualified visual loss, one eye, unspecified: Secondary | ICD-10-CM | POA: Diagnosis not present

## 2020-06-21 LAB — BASIC METABOLIC PANEL
Anion gap: 13 (ref 5–15)
BUN: 10 mg/dL (ref 8–23)
CO2: 23 mmol/L (ref 22–32)
Calcium: 8.7 mg/dL — ABNORMAL LOW (ref 8.9–10.3)
Chloride: 100 mmol/L (ref 98–111)
Creatinine, Ser: 0.96 mg/dL (ref 0.61–1.24)
GFR, Estimated: >60 mL/min (ref >60)
Glucose, Bld: 124 mg/dL — ABNORMAL HIGH (ref 70–99)
Potassium: 4 mmol/L (ref 3.5–5.1)
Sodium: 136 mmol/L (ref 135–145)

## 2020-06-21 LAB — CBC
HCT: 24.3 % — ABNORMAL LOW (ref 39.0–52.0)
Hemoglobin: 7.9 g/dL — ABNORMAL LOW (ref 13.0–17.0)
MCH: 31.5 pg (ref 26.0–34.0)
MCHC: 32.5 g/dL (ref 30.0–36.0)
MCV: 96.8 fL (ref 80.0–100.0)
Platelets: 585 10*3/uL — ABNORMAL HIGH (ref 150–400)
RBC: 2.51 MIL/uL — ABNORMAL LOW (ref 4.22–5.81)
RDW: 14 % (ref 11.5–15.5)
WBC: 13.1 10*3/uL — ABNORMAL HIGH (ref 4.0–10.5)
nRBC: 0.2 % (ref 0.0–0.2)

## 2020-06-21 NOTE — Progress Notes (Addendum)
Subjective:  Patient reports feels much improved. He is extremely hopeful for discharge today. He has been able to get in contact with his daughter, but would not like for family to care for his apartment in his absence. Patient expressed he will follow out-patient.   Intermittent events: Patient found someone to care for apartment and pets in his absence.   Objective:  Vital signs in last 24 hours: Vitals:   06/20/20 1705 06/20/20 2116 06/21/20 0638 06/21/20 1206  BP: 121/72 99/75 104/62 118/70  Pulse: 68 73 63 69  Resp:  _0 Temp: 98.1 F (36.7 C) 98.2 F (36.8 C) 99.2 F (37.3 C) 99.3 F (37.4 C)  TempSrc: Oral Oral Oral Oral  SpO2: 95% 95% 96% 96%  Weight:      Height:       Weight change:   Intake/Output Summary (Last 24 hours) at 06/21/2020 1234 Last data filed at 06/21/2020 0640 Gross per 24 hour  Intake 720 ml  Output 425 ml  Net 295 ml    Physical Exam Constitutional:      General: He is not in acute distress.    Appearance: He is not ill-appearing, toxic-appearing or diaphoretic.     Comments: Patient seen lying in bed comfortably, NAD. Mildly agitated in matters surrounding discharge, but reasonably pleasant and cooperative.   Pulmonary:     Effort: Pulmonary effort is normal. No respiratory distress.  Neurological:     Mental Status: He is alert.  Psychiatric:        Behavior: Behavior is agitated.        Thought Content: Thought content normal.        Judgment: Judgment normal.     Assessment/Plan: Cleared Hep B and Hep C  Principal Problem:   Cavitary pneumonia Active Problems:   Necrotizing pneumonia (HCC)   Monocular vision loss   Abscess of lung with pneumonia (HCC)  Cavitary RLLPneumonia Thrombocytosis Patient presenting with two weeks of productive cough, tactile fevers, and chills. Labs significant for leukocytosis with left shift, that is downtrending.Contrasted CTA showedRLL pneumonia with consolidation and potential early  cavitation,concern fornecrotizing pneumonia; and reactive lymph nodes; no pleural effusion. Patientreportingdistant history of incarceration and shelter residence; and more recent hx of volunteering at shelter.MRSA PCR negative. Blood cultures without growth to date. Quantiferon gold on 2/8 positive while PPD negative; indicating potential for primary progressive TB.AFB sputum smear negative thus far.BAL culture with Neutrophils and rare Gram (+) cocci in clusters at this time.   *ID following, appreciate recs -Await Bronch results, (+) Quantiferon gold raises potential for TB etiology (maybe recent conversion, with subsequent primary progressive TB) -Okay to d/c if last AFB negative, and f/u outpatient; as patient desires to leave -for respiratory isolation: hope to get rifampin/mtb pcr back   -if negative, take out of isolation and f/u in ID clinic for final AFB culture/other fungal serology -immunosuppression per Neurology per below  *Appreciate Pulmonology recs: -Bronchoscopy yesterday -f/u BAL AFB & transbronchial biopsies -Okay to discharge when able, with f/u Pulm appointment to discuss results *ID recs: -Day 4 Augmentin: plan for 4 weeks *Appreciate PT   -Patient refused; signed off with recs for home health PT -F/u blood cultures -Airborne& Dropletprecautions -TB work up pending   -Robitussin PRN -Tylenol PRN -Trend CBC  Optic Neuritis Monocular Vision Loss Chronic pontine lacunar infarctions and cortical infarctions MRI with evidence of optic neuritis involving the entire left optic nerve as well as questions ofpossible demyelinating, infectious, or  inflammatory sequela within the cerebral white matter, basal ganglia, and pons. On exam his left pupil is fixed and non reactive. Also with left cranial nerve III palsy, and chronicdyscongugate gaze.Patient reporting hx of strabismus surgery in childhood.CSF analysis showed normal glucose of 56, and protein  elevation to 80; with no organisms detected.Elevated CSF protein; CSF IgG elevated at 14.5.   *Neurology following, appreciate recs: -Will see tomorrow, f/u recs -ASA 81 prophylaxis  -f/u further recs  *Ophtalmology following, appreciate recs -Findings consistent with old central retinal vein occlusion -outpatient general eye care  NormocyticAnemia No recent labs to establish baseline. Normal colonoscopy in 2015. No evidence of active bleeding however hgb 10>8.9 since admission.on studies consistent with anemia of inflammation (low TIBC and saturation, high ferritin). Normal B12 and folate.Previously stable at 8.4; significantdrop to 7.5. Likely some iatrogenic contribution from lab draws. Has remained stable at 7.9   -trend hgb -Transfuse for <7 -continue to monitor   LOS: 6 days   Azell Der, Medical Student 06/21/2020, 12:34 PM    Attestation for Student Documentation:  I personally was present and performed or re-performed the history, physical exam and medical decision-making activities of this service and have verified that the service and findings are accurately documented in the student's note.  Sanjuan Dame, MD 06/21/2020, 4:33 PM [phone]

## 2020-06-21 NOTE — Plan of Care (Signed)
  Problem: Education: Goal: Knowledge of General Education information will improve Description Including pain rating scale, medication(s)/side effects and non-pharmacologic comfort measures Outcome: Progressing   

## 2020-06-21 NOTE — Plan of Care (Signed)
  Problem: Education: Goal: Knowledge of General Education information will improve Description: Including pain rating scale, medication(s)/side effects and non-pharmacologic comfort measures 06/21/2020 2353 by Johnn Hai, RN Outcome: Progressing 06/21/2020 2038 by Johnn Hai, RN Outcome: Progressing

## 2020-06-21 NOTE — Progress Notes (Signed)
Neurology Progress Note  Subjective: On assessment this morning, patient very agitated about being in the hospital. When asked about his vision, he does not cooperate with examination and states "I see you standing there, that's it" and claims his vision is the same as it has been. He further states "my eye is not even why I came into this hospital". He verbalizes wishes to go home, shower, and to get out of the hospital. Endorses dissatisfaction with being in the hospital for 6 days now.    Objective: Current vital signs: BP 104/62 (BP Location: Left Arm)   Pulse 63   Temp 99.2 F (37.3 C) (Oral)   Resp 18   Ht _0  (1.854 m)   Wt 82.9 kg   SpO2 96%   BMI 24.11 kg/m  Vital signs in last 24 hours: Temp:  [98.1 F (36.7 C)-99.3 F (37.4 C)] 99.2 F (37.3 C) (02/12 6629) Pulse Rate:  [62-73] 63 (02/12 0638) Resp:  [18] 18 (02/12 4765) BP: (99-121)/(62-75) 104/62 (02/12 4650) SpO2:  [93 %-96 %] 96 % (02/12 3546)  Intake/Output from previous day: 02/11 0701 - 02/12 0700 In: 1420 [P.O.:1420] Out: 1025 [Urine:1025] Intake/Output this shift: No intake/output data recorded. Nutritional status:  Diet Order            Diet regular Room service appropriate? Yes; Fluid consistency: Thin  Diet effective now                Neurologic Exam: Extremely limited due to patient agitation and limited cooperation. Able to be slightly consoled by talking about further treatment options.  Mental status: awake, alert to person, place, and situation. Speech is fluent without dysarthria or aphasia.  Cranial Nerves:  II. Chronic disconjugate gaze after strabismus surgery. Left eye with chronic (since October) limited gross perception of light and shaped with impaired color and higher acuity form perception. Tracks examiner at bedside. Limited participation with eye exam this morning. III, IV, VI: EOMI tracks examiner around room, left eye with intermittent lag behind right eye movement  medially V: Unable to assess due to patient limited cooperation VII: Face appears symmetric resting VIII: Hearing intact to voice IX, X: Phonation intact. XI: Head appears midline XII: Unable to assess due to patient limited cooperation/agitation Motor: Moves upper and lower extremities spontaneously, antigravity movement in all extremities Sensation: Unable to assess due to patient limited cooperation/agitation. Coordination: Unable to assess due to patient limited cooperation/agitation DTRs: Unable to assess due to patient limited cooperation/agitation Gait: Deferred  Lab Results: Results for orders placed or performed during the hospital encounter of 06/14/20 (from the past 48 hour(s))  Culture, respiratory     Status: None (Preliminary result)   Collection Time: 06/19/20  3:15 PM   Specimen: Bronchial Alveolar Lavage; Respiratory  Result Value Ref Range   Specimen Description BRONCHIAL ALVEOLAR LAVAGE    Special Requests NONE    Gram Stain      FEW WBC PRESENT, PREDOMINANTLY PMN RARE GRAM POSITIVE COCCI IN CLUSTERS    Culture      CULTURE REINCUBATED FOR BETTER GROWTH Performed at [facility], [place] [address], [place], [place] [address]    Report Status PENDING   Surgical pathology     Status: None   Collection Time: 06/19/20  3:17 PM  Result Value Ref Range   SURGICAL PATHOLOGY      SURGICAL PATHOLOGY CASE: [id number] PATIENT: [name] [name] Surgical Pathology Report     Clinical History: RLL pneumonia (cm)  FINAL MICROSCOPIC DIAGNOSIS:  A. LUNG, RIGHT LOWER, TRANSBRONCHIAL BIOPSY: - Fragment of inflamed bronchial wall with suppurative inflammation - Negative for granulomas or malignancy     GROSS DESCRIPTION:  Received in formalin are few minute pieces of tan-red soft tissue/material, 0.2 x 0.2 x 0.1 cm in aggregate.  Submitted in 1 block.  SW 06/19/2020   Final Diagnosis performed by Jaquita Folds, MD.   Electronically signed  06/20/2020 Technical component performed at [employer]. [facility], [place] [address] [place], [place] [address].  Professional component performed at [facility], [place] [address], [place], [place] [address].  Immunohistochemistry Technical component (if applicable) was performed at [facility]. [address], [place], [place], [place] [address].   [place] (if applicable): Some of these immunohistochemical stains may have been developed and the performance characteristics determine by [facility]. Some may not have been cleared or approved by the U.S. Food and Drug Administration. The FDA has determined that such clearance or approval is not necessary. This test is used for clinical purposes. It should not be regarded as investigational or for research. This laboratory is certified under the [place] (CLIA-88) as qualified to perform high complexity clinical laboratory testing.  The controls stained appropriately.   Cytology - Non PAP;     Status: None   Collection Time: 06/19/20  3:18 PM  Result Value Ref Range   CYTOLOGY - NON GYN      CYTOLOGY - NON PAP CASE: [id number] PATIENT: [name] [name] Non-Gynecological Cytology Report     Clinical History: RLL pneumonia Specimen Submitted:  A. LUNG, RIGHT LOWER LOBE, BRUSHING:   FINAL MICROSCOPIC DIAGNOSIS: - No malignant cells identified  SPECIMEN ADEQUACY: Satisfactory for evaluation  GROSS: Received is/are 2 slides in 95% ethyl alcohol.(GW:gw) Smears: 2 Concentration Method (Thin Prep): 0 Cell Block: 0 Additional Studies: N/A     Final Diagnosis performed by Jaquita Folds, MD.   Electronically signed 06/20/2020 Technical component performed at [employer]. [facility], [place] [address], [place], [place] [address].  Professional component performed at [facility], [place] [address] [place], [place] [address].  Immunohistochemistry Technical component (if applicable) was performed at [facility]. [address] [place], [place], [place] [address].   IMMUNOHISTOCHEMISTRY DISCLAIMER (if applicable ): Some of these immunohistochemical stains may have been developed and the performance characteristics determine by [facility]. Some may not have been cleared or approved by the U.S. Food and Drug Administration. The FDA has determined that such clearance or approval is not necessary. This test is used for clinical purposes. It should not be regarded as investigational or for research. This laboratory is certified under the [place] (CLIA-88) as qualified to perform high complexity clinical laboratory testing.  The controls stained appropriately.   CBC     Status: Abnormal   Collection Time: 06/20/20  5:12 AM  Result Value Ref Range   WBC 13.2 (H) 4.0 - 10.5 K/uL   RBC 2.47 (L) 4.22 - 5.81 MIL/uL   Hemoglobin 7.6 (L) 13.0 - 17.0 g/dL   HCT 24.3 (L) 39.0 - 52.0 %   MCV 98.4 80.0 - 100.0 fL   MCH 30.8 26.0 - 34.0 pg   MCHC 31.3 30.0 - 36.0 g/dL   RDW 13.8 11.5 - 15.5 %   Platelets 534 (H) 150 - 400 K/uL   nRBC 0.2 0.0 - 0.2 %  Comment: Performed at [facility], [place] [address] [place], [place] [address]  Basic metabolic panel     Status: Abnormal   Collection Time: 06/20/20  5:12 AM  Result Value Ref Range   Sodium 135 135 - 145 mmol/L   Potassium 4.1 3.5 - 5.1 mmol/L   Chloride 99 98 - 111 mmol/L   CO2 25 22 - 32 mmol/L   Glucose, Bld 100 (H) 70 - 99 mg/dL    Comment: Glucose reference range applies only to samples taken after fasting for at least 8 hours.   BUN 8 8 - 23 mg/dL   Creatinine, Ser 1.01 0.61 - 1.24 mg/dL   Calcium 8.6 (L) 8.9 - 10.3 mg/dL   GFR, Estimated >60 >60 mL/min    Comment: (NOTE) Calculated using the CKD-EPI Creatinine Equation (2021)    Anion  gap 11 5 - 15    Comment: Performed at [facility] [address], [place], [place] [address]  CBC     Status: Abnormal   Collection Time: 06/21/20  6:53 AM  Result Value Ref Range   WBC 13.1 (H) 4.0 - 10.5 K/uL   RBC 2.51 (L) 4.22 - 5.81 MIL/uL   Hemoglobin 7.9 (L) 13.0 - 17.0 g/dL   HCT 24.3 (L) 39.0 - 52.0 %   MCV 96.8 80.0 - 100.0 fL   MCH 31.5 26.0 - 34.0 pg   MCHC 32.5 30.0 - 36.0 g/dL   RDW 14.0 11.5 - 15.5 %   Platelets 585 (H) 150 - 400 K/uL   nRBC 0.2 0.0 - 0.2 %    Comment: Performed at [facility], [place] [address], [place], [place] [address]  Basic metabolic panel     Status: Abnormal   Collection Time: 06/21/20  6:53 AM  Result Value Ref Range   Sodium 136 135 - 145 mmol/L   Potassium 4.0 3.5 - 5.1 mmol/L   Chloride 100 98 - 111 mmol/L   CO2 23 22 - 32 mmol/L   Glucose, Bld 124 (H) 70 - 99 mg/dL    Comment: Glucose reference range applies only to samples taken after fasting for at least 8 hours.   BUN 10 8 - 23 mg/dL   Creatinine, Ser 0.96 0.61 - 1.24 mg/dL   Calcium 8.7 (L) 8.9 - 10.3 mg/dL   GFR, Estimated >60 >60 mL/min    Comment: (NOTE) Calculated using the CKD-EPI Creatinine Equation (2021)    Anion gap 13 5 - 15    Comment: Performed at [facility] [address] [place], [place] [address]    Recent Results (from the past 240 hour(s))  Resp Panel by RT-PCR (Flu A&B, Covid)     Status: None   Collection Time: 06/14/20  2:04 AM  Result Value Ref Range Status   SARS Coronavirus 2 by RT PCR NEGATIVE NEGATIVE Final    Comment: (NOTE) SARS-CoV-2 target nucleic acids are NOT DETECTED.  The SARS-CoV-2 RNA is generally detectable in upper respiratory specimens during the acute phase of infection. The lowest concentration of SARS-CoV-2 viral copies this assay can detect is 138 copies/mL. A negative result does not preclude SARS-Cov-2 infection and should not be used as the sole basis for treatment or other patient management decisions.  A negative result may occur with  improper specimen collection/handling, submission of specimen other than nasopharyngeal swab, presence of viral mutation(s) within the areas targeted by this assay, and inadequate number of viral copies(<138 copies/mL).  A negative result must be combined with clinical observations, patient history, and epidemiological information. The expected result is Negative.  Fact Sheet for Patients:  EntrepreneurPulse.com.au  Fact Sheet for Healthcare Providers:  IncredibleEmployment.be  This test is no t yet approved or cleared by the [place] FDA and  has been authorized for detection and/or diagnosis of SARS-CoV-2 by FDA under an Emergency Use Authorization (EUA). This EUA will remain  in effect (meaning this test can be used) for the duration of the COVID-19 declaration under Section 564(b)(1) of the Act, 21 U.S.C.section 360bbb-3(b)(1), unless the authorization is terminated  or revoked sooner.       Influenza A by PCR NEGATIVE NEGATIVE Final   Influenza B by PCR NEGATIVE NEGATIVE Final    Comment: (NOTE) The Xpert Xpress SARS-CoV-2/FLU/RSV plus assay is intended as an aid in the diagnosis of influenza from Nasopharyngeal swab specimens and should not be used as a sole basis for treatment. Nasal washings and aspirates are unacceptable for Xpert Xpress SARS-CoV-2/FLU/RSV testing.  Fact Sheet for Patients: EntrepreneurPulse.com.au  Fact Sheet for Healthcare Providers: IncredibleEmployment.be  This test is not yet approved or cleared by the [place] FDA and has been authorized for detection and/or diagnosis of SARS-CoV-2 by FDA under an Emergency Use Authorization (EUA). This EUA will remain in effect (meaning this test can be used) for the duration of the COVID-19 declaration under Section 564(b)(1) of the Act, 21 U.S.C. section 360bbb-3(b)(1), unless the authorization  is terminated or revoked.  Performed at [facility], [place] [address], [place], [place] [address]   SARS Coronavirus 2 by RT PCR (hospital order, performed in [facility] hospital lab) Nasopharyngeal Nasopharyngeal Swab     Status: None   Collection Time: 06/14/20  3:44 PM   Specimen: Nasopharyngeal Swab  Result Value Ref Range Status   SARS Coronavirus 2 NEGATIVE NEGATIVE Final    Comment: (NOTE) SARS-CoV-2 target nucleic acids are NOT DETECTED.  The SARS-CoV-2 RNA is generally detectable in upper and lower respiratory specimens during the acute phase of infection. The lowest concentration of SARS-CoV-2 viral copies this assay can detect is 250 copies / mL. A negative result does not preclude SARS-CoV-2 infection and should not be used as the sole basis for treatment or other patient management decisions.  A negative result may occur with improper specimen collection / handling, submission of specimen other than nasopharyngeal swab, presence of viral mutation(s) within the areas targeted by this assay, and inadequate number of viral copies (<250 copies / mL). A negative result must be combined with clinical observations, patient history, and epidemiological information.  Fact Sheet for Patients:   StrictlyIdeas.no  Fact Sheet for Healthcare Providers: BankingDealers.co.za  This test is not yet approved or  cleared by the [place] FDA and has been authorized for detection and/or diagnosis of SARS-CoV-2 by FDA under an Emergency Use Authorization (EUA).  This EUA will remain in effect (meaning this test can be used) for the duration of the COVID-19 declaration under Section 564(b)(1) of the Act, 21 U.S.C. section 360bbb-3(b)(1), unless the authorization is terminated or revoked sooner.  Performed at [facility], [place] [address], [place], [place] [address]   Culture, blood (routine x 2) Call MD if unable to obtain  prior to antibiotics being given     Status: None   Collection Time: 06/15/20  1:51 PM   Specimen: BLOOD  Result Value Ref Range Status   Specimen Description BLOOD LEFT ANTECUBITAL  Final  Special Requests   Final    BOTTLES DRAWN AEROBIC AND ANAEROBIC Blood Culture adequate volume   Culture   Final    NO GROWTH 5 DAYS Performed at [facility], [place] [address], [place], [place] [address]    Report Status 06/20/2020 FINAL  Final  Culture, blood (routine x 2) Call MD if unable to obtain prior to antibiotics being given     Status: None   Collection Time: 06/15/20  1:52 PM   Specimen: BLOOD RIGHT HAND  Result Value Ref Range Status   Specimen Description BLOOD RIGHT HAND  Final   Special Requests   Final    BOTTLES DRAWN AEROBIC AND ANAEROBIC Blood Culture adequate volume   Culture   Final    NO GROWTH 5 DAYS Performed at [facility], [place] [address] [place], [place] [address]    Report Status 06/20/2020 FINAL  Final  MRSA PCR Screening     Status: None   Collection Time: 06/15/20  8:11 PM   Specimen: Nasal Mucosa; Nasopharyngeal  Result Value Ref Range Status   MRSA by PCR NEGATIVE NEGATIVE Final    Comment:        The GeneXpert MRSA Assay (FDA approved for NASAL specimens only), is one component of a comprehensive MRSA colonization surveillance program. It is not intended to diagnose MRSA infection nor to guide or monitor treatment for MRSA infections. Performed at [facility], [place] [address], [place], [place] [address]   Acid Fast Smear (AFB)     Status: None   Collection Time: 06/17/20 10:45 AM   Specimen: Sputum  Result Value Ref Range Status   AFB Specimen Processing Concentration  Final   Acid Fast Smear Negative  Final    Comment: (NOTE) Performed At: [facility] [place], [place] [id number] Rush Farmer MD [id number]    Source (AFB) SPU  Final    Comment: Performed at [facility], [place] [address]  [place], [place] [address]  Acid Fast Smear (AFB)     Status: None   Collection Time: 06/17/20 10:45 AM   Specimen: CSF  Result Value Ref Range Status   AFB Specimen Processing Concentration  Final   Acid Fast Smear Negative  Final    Comment: (NOTE) Performed At: [facility] [address] [place], [place] [id number] Rush Farmer MD [id number]    Source (AFB) SPU  Final    Comment: Performed at [facility], [place] [address], [place], [place] [address]  CSF culture     Status: None   Collection Time: 06/17/20  3:38 PM   Specimen: PATH Cytology CSF; Cerebrospinal Fluid  Result Value Ref Range Status   Specimen Description CSF  Final   Special Requests NONE  Final   Gram Stain   Final    WBC PRESENT, PREDOMINANTLY MONONUCLEAR NO ORGANISMS SEEN CYTOSPIN SMEAR    Culture   Final    NO GROWTH 3 DAYS Performed at [facility], [place] [address], [place], [place] [address]    Report Status 06/20/2020 FINAL  Final  MTB RIF NAA w/o Culture, Sputum     Status: None (Preliminary result)   Collection Time: 06/17/20  4:40 PM   Specimen: Expectorated Sputum  Result Value Ref Range Status   M Tuberculosis Complex PENDING  Incomplete   Rifampin PENDING  Incomplete   AFB Specimen Processing Direct Inoculation  Final    Comment: (NOTE) Performed  At: [facility] [place], [place] [id number] Rush Farmer MD [id number]   Expectorated sputum assessment w rflx to resp cult     Status: None   Collection Time: 06/17/20  4:42 PM   Specimen: Expectorated Sputum  Result Value Ref Range Status   Specimen Description EXPECTORATED SPUTUM  Final   Special Requests NONE  Final   Sputum evaluation   Final    THIS SPECIMEN IS ACCEPTABLE FOR SPUTUM CULTURE Performed at [facility], [address] [place], [place] [address]    Report Status 06/18/2020 FINAL  Final  Culture, respiratory     Status: None   Collection Time: 06/17/20  4:42 PM  Result  Value Ref Range Status   Specimen Description EXPECTORATED SPUTUM  Final   Special Requests NONE Reflexed from [id number]  Final   Gram Stain   Final    RARE WBC PRESENT,BOTH PMN AND MONONUCLEAR FEW GRAM VARIABLE ROD RARE GRAM POSITIVE COCCI IN PAIRS    Culture   Final    Normal respiratory flora-no Staph aureus or Pseudomonas seen Performed at [facility], [place] [address] [place], [place] [address]    Report Status 06/20/2020 FINAL  Final  Culture, respiratory     Status: None (Preliminary result)   Collection Time: 06/19/20  3:15 PM   Specimen: Bronchial Alveolar Lavage; Respiratory  Result Value Ref Range Status   Specimen Description BRONCHIAL ALVEOLAR LAVAGE  Final   Special Requests NONE  Final   Gram Stain   Final    FEW WBC PRESENT, PREDOMINANTLY PMN RARE GRAM POSITIVE COCCI IN CLUSTERS    Culture   Final    CULTURE REINCUBATED FOR BETTER GROWTH Performed at [facility], [place] [address] [place], [place] [address]    Report Status PENDING  Incomplete   Lab Results  Component Value Date   CHOL 113 07/02/2013   HDL 33 (L) 07/02/2013   LDLCALC 49 07/02/2013   TRIG 154 (H) 07/02/2013   CHOLHDL 3.4 07/02/2013   Studies/Results: DG CHEST PORT 1 VIEW  Result Date: 06/19/2020 CLINICAL DATA:  Status post bronchoscopy and biopsy EXAM: PORTABLE CHEST 1 VIEW COMPARISON:  06/14/2020, 06/15/2020 FINDINGS: Single frontal view of the chest demonstrates an unremarkable cardiac silhouette. Consolidation within the medial right lung base again noted, compatible with known pneumonia. Trace right pleural effusion has developed in the interim. No pneumothorax after bronchoscopy. The left chest is clear. IMPRESSION: 1. Persistent right basilar consolidation consistent with pneumonia. 2. Trace right pleural effusion. 3. No pneumothorax.  No evidence of complication after bronchoscopy. Electronically Signed   By: Randa Ngo M.D.   On: 06/19/2020 15:52   DG C-ARM  BRONCHOSCOPY  Result Date: 06/19/2020 C-ARM BRONCHOSCOPY: Fluoroscopy was utilized by the requesting physician.  No radiographic interpretation.   Medications:  Scheduled: . amoxicillin-clavulanate  1 tablet Oral Q12H  . aspirin EC  81 mg Oral Daily  . enoxaparin (LOVENOX) injection  40 mg Subcutaneous Q24H  . folic acid  1 mg Oral Daily  . multivitamin with minerals  1 tablet Oral Daily  . thiamine  100 mg Oral Daily   Or  . thiamine  100 mg Intravenous Daily    Assessment: 65 year old male with acute respiratory illness who complains of left eye vision loss with acute onset in October that has persisted. -The vision loss manifested less than 1 week following the first Covid vaccination, back in October. Ophthalmology has seen the patient and  based on their retinal exam, his vision loss is felt most likely to be due to an old central retinal vein occlusion (CRVO) -Left ocular motility deficit is chronic since childhood, present since a strabismus surgery. -Although CRVO is diagnosed by Ophthalmology, MRI orbits revealed hyperintense signal abnormality of the left optic nerve concerning for optic neuritis. However, images have been reviewed by Neurology and the left optic nerve finding is not highly conspicuous, raising the possibility that it is artifactual, which would weigh more in favor of CRVO occurring in October as the etiology for his vision loss. Also unusual for an optic neuritis was the fact that the patient did not and continues not to have any eye pain associated with his vision loss. On the other hand, fluoroscopically-guided LP initial labs showed an elevated total protein of 80, suggestive of inflammation. This would tend to favor an optic neuritis or infectious optic neuropathy as the etiology for the vision loss OS and increases the likelihood that the MRI finding was not artifactual.  -Per ID, TB monoocular involvement is rare. 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 Sputum AFB smear negative x 2, with third smear and cultures pending for further diagnosis.   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  - Optic neuritis is lower on the DDx based on the above (see assessment) therefore, would recommend to hold off on steroids for now. Need additional clinical information regarding the possibility of TB. If TB is ruled out, then an empiric 5 day course of IV Solumedrol may be indicated.   - Due to chronic nature of vision loss (> 4 months) and patient reluctance for further hospitalization and with inability to monitor infection and response to steroids outpatient, would be hesitant to initiate steroid treatment inpatient. He is amenable to outpatient infusions, if TB is ruled out, consider IV steroid treatment on an outpatient basis with appropriate follow up.   - Consider outpatient ID and ophthalmology follow-up for further management of vision loss and pneumonia.     -Await further information from ID.   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 BAL and transbronchial bx results pending.  Rifampin PCR also pending.   Old stroke seen on MRI    -Continue ASA 81 mg daily for stroke prophylaxis.    LOS: 6 days   _0  signed: Dr. Kerney Elbe 06/21/2020  8:00 AM

## 2020-06-22 ENCOUNTER — Encounter (HOSPITAL_COMMUNITY): Payer: Self-pay | Admitting: Pulmonary Disease

## 2020-06-22 DIAGNOSIS — H547 Unspecified visual loss: Secondary | ICD-10-CM

## 2020-06-22 DIAGNOSIS — J181 Lobar pneumonia, unspecified organism: Secondary | ICD-10-CM

## 2020-06-22 DIAGNOSIS — F1010 Alcohol abuse, uncomplicated: Secondary | ICD-10-CM

## 2020-06-22 LAB — ANAEROBIC CULTURE

## 2020-06-22 LAB — CBC
HCT: 26.9 % — ABNORMAL LOW (ref 39.0–52.0)
Hemoglobin: 8.1 g/dL — ABNORMAL LOW (ref 13.0–17.0)
MCH: 30.6 pg (ref 26.0–34.0)
MCHC: 30.1 g/dL (ref 30.0–36.0)
MCV: 101.5 fL — ABNORMAL HIGH (ref 80.0–100.0)
Platelets: 646 10*3/uL — ABNORMAL HIGH (ref 150–400)
RBC: 2.65 MIL/uL — ABNORMAL LOW (ref 4.22–5.81)
RDW: 14.2 % (ref 11.5–15.5)
WBC: 13.9 10*3/uL — ABNORMAL HIGH (ref 4.0–10.5)
nRBC: 0 % (ref 0.0–0.2)

## 2020-06-22 LAB — BASIC METABOLIC PANEL
Anion gap: 11 (ref 5–15)
BUN: 8 mg/dL (ref 8–23)
CO2: 22 mmol/L (ref 22–32)
Calcium: 8.9 mg/dL (ref 8.9–10.3)
Chloride: 102 mmol/L (ref 98–111)
Creatinine, Ser: 0.91 mg/dL (ref 0.61–1.24)
GFR, Estimated: >60 mL/min (ref >60)
Glucose, Bld: 103 mg/dL — ABNORMAL HIGH (ref 70–99)
Potassium: 4.3 mmol/L (ref 3.5–5.1)
Sodium: 135 mmol/L (ref 135–145)

## 2020-06-22 LAB — CULTURE, RESPIRATORY W GRAM STAIN: Culture: NORMAL

## 2020-06-22 LAB — ACID FAST SMEAR (AFB, MYCOBACTERIA): Acid Fast Smear: NEGATIVE

## 2020-06-22 MED ORDER — FOLIC ACID 1 MG PO TABS: 1.0000 mg | ORAL_TABLET | Freq: Every day | ORAL | 0 refills | Status: AC

## 2020-06-22 MED ORDER — AMOXICILLIN-POT CLAVULANATE 875-125 MG PO TABS
1.0000 | ORAL_TABLET | Freq: Two times a day (BID) | ORAL | 0 refills | Status: AC
Start: 2020-06-22 — End: 2020-07-13

## 2020-06-22 MED ORDER — ASPIRIN 81 MG PO TBEC
81.0000 mg | DELAYED_RELEASE_TABLET | Freq: Every day | ORAL | 11 refills | Status: AC
Start: 2020-06-22 — End: ?

## 2020-06-22 MED ORDER — THIAMINE HCL 100 MG PO TABS: 100.0000 mg | ORAL_TABLET | Freq: Every day | ORAL | 0 refills | Status: AC

## 2020-06-22 NOTE — Plan of Care (Signed)

## 2020-06-22 NOTE — Discharge Summary (Addendum)
Name: [name] [name] MRN: [id number] DOB: [birth year] 65 y.o. PCP: Patient, No Pcp Per  Date of Admission: 06/14/2020  3:32 PM Date of Discharge:  06/22/2020 Attending Physician: Reymundo Poll, MD  Subjective: Patient evaluated at bedside this AM. States he is doing well, feels almost back to normal. Denies further respiratory symptoms or fevers. Discussed plan for discharge including multiple follow-up appointments between PCP, ID, neurology, pulmonology, and ophthalmology. Patient verbalized understanding.  Discharge Diagnosis: 1. Cavitary RLL pneumonia 2. Chronic monocular vision loss 3. Normocytic anemia 4. Microscopic hematuria 5. Alcohol use disorder  Discharge Medications: Allergies as of 06/22/2020       Reactions   Hctz [hydrochlorothiazide]    Erectile dysfunction        Medication List     TAKE these medications    amoxicillin-clavulanate 875-125 MG tablet Commonly known as: AUGMENTIN Take 1 tablet by mouth 2 (two) times daily for 21 days.   aspirin 81 MG EC tablet Take 1 tablet (81 mg total) by mouth daily. Swallow whole.   folic acid 1 MG tablet Commonly known as: FOLVITE Take 1 tablet (1 mg total) by mouth daily.   lisinopril 40 MG tablet Commonly known as: ZESTRIL Take 40 mg by mouth daily.   thiamine 100 MG tablet Take 1 tablet (100 mg total) by mouth daily.        Disposition and follow-up:   Mr.Yutaka Raver was discharged from [facility] in Stable condition.  At the hospital follow up visit please address:  1. Cavitary RLL pneumonia: Concern for aspiration vs TB. Prior to discharge received three negative AFB smears with positive Quantiferon. Patient to follow-up with pulmonology s/p bronchoscopy and ID. Patient to complete three more weeks Augmentin (end date 07/12/20). No TB precautions needed unless AFB culture returns positive.  2. Chronic monocular vision loss: Possible inflammation of optic nerve on MRI w/ concern  for optic neuritis. Ophthalmology consulted, believed to be CRVO without active signs of inflammation. Can consider IV steroids as outpatient. Patient will follow-up with neurology for further recommendations.  3. Normocytic anemia: Most consistent with anemia of inflammation. No active source of bleeding, will need follow-up CBC.  4. Microscopic hematuria: UA on admission w/ microscopic hematuria, patient asymptomatic. Will need f/u UA with PCP.  5. Alcohol use disorder: Reportedly drinks ~40oz EtOH daily. No signs of withdrawal during hospitalization. Started on thiamine, folate, can re-assess need with PCP.  6.  Labs / imaging needed at time of follow-up: BMP, CBC, ferritin/iron studies  7.  Pending labs/ test needing follow-up: AFB, fungal cultures  Follow-up Appointments:  Follow-up Information     Schedule an appointment as soon as possible for a visit  with Stephannie Li, MD.   Specialty: Ophthalmology Contact information: [address] [place] [address] [phone]         Julio Sicks, NP Follow up on 06/30/2020.   Specialty: Pulmonary Disease Why: 330 pm  Contact information: [address] [place] [address] [phone]         Raymondo Band, MD. Go on 07/16/2020.   Specialty: Infectious Diseases Why: You have a scheduled appointment with Dr. Renold Don on 07/16/20 at 2:45pm.  Contact information: [address] [place] [address] [phone]         [facility]. Schedule an appointment as soon as possible for a visit in 1 week(s).   Why: Please make sure to make an appointment with Dr.  Sater with [facility] within the next 1-2 weeks. They can help set up outpatient steroid infusion. Contact information: [address]     [address] [place] [address] [phone]        Anselm Jungling, NP. Schedule an appointment as soon as possible for a visit in 1 week(s).    Specialty: Nurse Practitioner Why: Please call to make an appointment within the next week! Contact information: [address] [address] [place] [address] [phone]                Hospital Course by problem list: 1. Cavitary RLL pneumonia: On arrival patient presented with two week history of productive cough, tactile fevers, and chills. Imaging revealed RLL consolidation with early cavitation concerning for necrotizing pneumonia. Patient was started on Zosyn in ED. 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 Infectious disease consulted. Quantiferon positive. TB skin test negative. Concern for possible primary tuberculosis. Pulmonary also consulted and bronchoscopy performed with AFB smear, culture sent. Three AFB smears returned negative. Per ID, patient to complete total of 4 weeks antibiotics (end date: 3.5.22) for likely necrotizing bacterial pneumonia, quantiferon was likely false positive. Patient to follow-up with primary care, pulmonology, and infectious disease.  2. Chronic monocular vision loss: Patient reports non-painful eye loss that started in October 2021 following COVID-19 vaccination. MRI revealed possible active inflammation with concern for optic neuritis. Neurology and ophthalmology consulted. No other obvious neurological symptoms. Neurology initially concerned for demyelinating autoimmune disease with plans for IV steroids after pneumonia stabilized. LP pursued for evaluation of possible autoimmune vs infectious neurological processes and showed elevated protein and IgG in CSF. Ophthalmology examined patient, diagnosed central retinal vein occlusion as likely cause of vision loss. It was then decided to hold steroids as inpatient and to follow-up with neurology and ophthalmology as outpatient for further  evaluation and treatment.  3. Normocytic anemia: Patient persistently anemic throughout hospitalization. Further laboratory work-up revealed anemia most consistent with anemia of inflammation. No obvious source of bleeding. Patient will need to have follow-up studies completed with primary care physician to ensure counts have normalized. Would recommend repeating ferritin and iron studies when infection has resolved.   4. Microscopic hematuria: UA on arrival revealed RBC's, no previous history of trauma or previous hematuria. Patient asymptomatic. Will need to follow-up with PCP for further evaluation.  5. Alcohol use disorder: Patient reports ~40oz alcohol intake daily. Started patient on thiamine and folate while inpatient. No signs of withdrawal. Discharged with 30d supply. Patient to follow-up with PCP to determine need to continue taking medications.  Discharge Exam:   BP 111/76 (BP Location: Left Arm)   Pulse 71   Temp 98.9 F (37.2 C) (Oral)   Resp 16   Ht 6\' 1"  (1.854 m)   Wt 82.9 kg   SpO2 94%   BMI 24.11 kg/m  General: Resting comfortably in bed, no acute distress HENT: Normocephalic, atraumatic. Poor dentition. CV: Regular rate, rhythm. No murmurs, rubs, gallops. Pulm: Clear to auscultation bilaterally. No wheezing, rales, rhonchi appreciated. Neuro: Awake, alert, oriented x4. Moving extremities appropriately. Psych: Normal mood, affect, speech.  Pertinent Labs, Studies, and Procedures:  CBC Latest Ref Rng & Units 06/22/2020 06/21/2020 06/20/2020  WBC 4.0 - 10.5 K/uL 13.9(H) 13.1(H) 13.2(H)  Hemoglobin 13.0 - 17.0 g/dL 8.1(L) 7.9(L) 7.6(L)  Hematocrit 39.0 - 52.0 %  26.9(L) 24.3(L) 24.3(L)  Platelets 150 - 400 K/uL 646(H) 585(H) 534(H)   BMP Latest Ref Rng & Units 06/22/2020 06/21/2020 06/20/2020  Glucose 70 - 99 mg/dL 195(K) 932(I) 712(W)  BUN 8 - 23 mg/dL 8 10 8   Creatinine 0.61 - 1.24 mg/dL 5.80 9.98  Sodium 135 - 145 mmol/L 135 136 135  Potassium 3.5 - 5.1 mmol/L  4.3 4.0 4.1  Chloride 98 - 111 mmol/L 102 100 99  CO2 22 - 32 mmol/L 22 23 25   Calcium 8.9 - 10.3 mg/dL 8.9 3.38) )   CXR 2/5 > Reticulonodular opacities along right heart border.  CTA chest 2/6 > Lobar PNA in RLL with consolidation and suspicion of early cavitation. No pleural effusion or acute pulmonary embolus.  CT head 2/6 > Evidence of small vessel disease including age indeterminate involvement of deep white matter capsules and pons. Disconjugate gaze and chronic left lamina papyracea fracture.  MR brain, orbits 2/7 > No evidence of acute intracranial abnormality. Small foci of chronic cortical encephalomalacia within left parietal and temporal operculum. Mild to moderate multifocal hyperintensity within cerebral white matter, basal ganglia, and pons. Chronic pontine lacunar infarcts. Mild generalized atrophy of the brain. T2 hyperintense signal abnormality involving majority of left optic nerve with associated abnormal enhancement. Findings likely reflect sequela of optic neuritis. This enhancement likely indicates ongoing active inflammation/demyelination, but there are portions of L optic nerve that are atrophic.  Fluoroscopically-guided LP 2/8  Bronchoscopy 2/10  Discharge Instructions:   Mr. Voong, I am so glad you are feeling better! You were admitted with a severe pneumonia, which was concerning for tuberculosis. Thankfully we were able to rule out tuberculosis, but you will need to continue taking antibiotics for the next few weeks. In addition, imaging revealed possible inflammation in the nerve of your eye, which could be causing your vision loss. Please see the following notes:  -Please make sure to continue taking your antibiotic. You should take this twice daily, with the last day being July 12, 2020. The antibiotic is called Augmentin (amoxicillin-clavulanate).   -In addition, you will need to follow-up with multiple specialties, including neurology, ophthalmology,  infectious disease, pulmonology, and your primary care. Please make sure to make an appointment with your primary care physician within the next week. Also, please call the neurologist's office tomorrow, Monday, 06/23/20 to schedule an appointment within the next week or two. They can help set up your outpatient IV steroid infusions.  It was a pleasure meeting you, Mr. Ottaway. I hope you stay happy and healthy!  Thank you, 06/25/20, MD  Signed: Bing Ree, MD 06/22/2020, 10:22 AM   Pager: [phone]

## 2020-06-22 NOTE — Care Management (Signed)
1229 06-22-20 Patient has declined home health physical therapy. Patient states he lives alone and has a primary care provider that makes house calls. No further needs from Case Manager at this time. Graves-Bigelow, Lamar Laundry, RN, BSN Case Manager

## 2020-06-23 LAB — MTB RIF NAA W/O CULTURE, SPUTUM

## 2020-06-23 LAB — BLASTOMYCES ANTIGEN: Blastomyces Antigen: NOT DETECTED ng/mL

## 2020-06-24 LAB — HISTOPLASMA ANTIGEN, URINE: Histoplasma Antigen, urine: <0.5 (ref <0.5)

## 2020-06-24 LAB — MISC LABCORP TEST (SEND OUT): Labcorp test code: 827550

## 2020-06-30 ENCOUNTER — Ambulatory Visit (INDEPENDENT_AMBULATORY_CARE_PROVIDER_SITE_OTHER): Payer: Medicaid Other

## 2020-06-30 ENCOUNTER — Inpatient Hospital Stay: Payer: Medicaid Other | Admitting: Adult Health

## 2020-06-30 ENCOUNTER — Ambulatory Visit (INDEPENDENT_AMBULATORY_CARE_PROVIDER_SITE_OTHER): Payer: Medicaid Other | Admitting: Adult Health

## 2020-06-30 ENCOUNTER — Encounter: Payer: Self-pay | Admitting: Adult Health

## 2020-06-30 ENCOUNTER — Other Ambulatory Visit: Payer: Self-pay

## 2020-06-30 VITALS — BP 128/68 | HR 90 | Temp 97.5°F | Ht 73.0 in | Wt 175.0 lb

## 2020-06-30 DIAGNOSIS — J850 Gangrene and necrosis of lung: Secondary | ICD-10-CM

## 2020-06-30 DIAGNOSIS — J189 Pneumonia, unspecified organism: Secondary | ICD-10-CM | POA: Diagnosis not present

## 2020-06-30 DIAGNOSIS — H5460 Unqualified visual loss, one eye, unspecified: Secondary | ICD-10-CM

## 2020-06-30 DIAGNOSIS — J984 Other disorders of lung: Secondary | ICD-10-CM | POA: Diagnosis not present

## 2020-06-30 NOTE — Progress Notes (Signed)
@Patient  ID: , male    DOB: 08/04/1955, 65 y.o.   MRN: [id number]  Chief Complaint  Patient presents with  . Pneumonia    Referring provider: No ref. provider found  HPI: 65 year old male smoker seen for pulmonary consult during hospitalization February 2022 for right lower lobe cavitary pneumonia During hospitalization patient also had left sided visual loss felt to be secondary to a previous Medical history significant for polysubstance abuse with cocaine and alcohol in the past   TEST/EVENTS :   06/30/2020 Follow up : Cavitary pneumonia Patient presents for a post hospital follow-up.  Patient was seen earlier this month for pulmonary consult during hospitalization. For Cavitary Pneumonia . CT chest June 15, 2020 showed lobar pneumonia in the right lower lobe consolidation and suspicion of early cavitation negative for PE.  And reactive appearing mediastinal and right hilar lymph nodes. Patient underwent bronchoscopy.  Cytology was negative for malignant cells.  Pathology showed inflamed bronchial wall with suppurative inflammation, negative for granulomas or malignancy Sputum culture, fungus, AFB were negative.  There was concern for possible TB.  Prior to discharge patient had 3 negative  AFB smears with a positive QuantiFERON.  Patient was recommended to complete a total of 4 weeks of Augmentin and date July 12, 2020. Patient did have left-sided visual loss during hospitalization.  Felt to be possible inflammation of the optic nerve on MRI.  Patient was seen by neurology and ophthalmology.  Has planned follow-up with neurology in the outpatient setting.  Says feeling some better. Remains weak. Gets winded with activity level is improved.  Eating a little better.  Denies any bloody stools or diarrhea.  No fever. No inhalers , no previous diagnosis of COPD or asthma  Patient has restarted smoking.  Smoking cessation was encouraged.  Patient also did start back to  drinking but very minimal alcohol.  Cessation was encouraged.  Allergies  Allergen Reactions  . Hctz [Hydrochlorothiazide]     Erectile dysfunction    Immunization History  Administered Date(s) Administered  . Hepatitis A, Adult 10/29/2013, 06/25/2014  . Influenza,inj,Quad PF,6+ Mos 03/21/2014, 03/04/2019  . PPD Test 06/17/2020    Past Medical History:  Diagnosis Date  . Arthritis    right hip  . Disc displacement, lumbar   . Gout   . Hypertension    Takes medications daily    Tobacco History: Social History   Tobacco Use  Smoking Status Current Every Day Smoker  . Packs/day: 0.30  . Years: 35.00  . Pack years: 10.50  . Types: Cigarettes  Smokeless Tobacco Never Used   Ready to quit: Not Answered Counseling given: Not Answered   Outpatient Medications Prior to Visit  Medication Sig Dispense Refill  . amoxicillin-clavulanate (AUGMENTIN) 875-125 MG tablet Take 1 tablet by mouth 2 (two) times daily for 21 days. 41 tablet 0  . aspirin EC 81 MG EC tablet Take 1 tablet (81 mg total) by mouth daily. Swallow whole. 30 tablet 11  . folic acid (FOLVITE) 1 MG tablet Take 1 tablet (1 mg total) by mouth daily. 30 tablet 0  . lisinopril (ZESTRIL) 40 MG tablet Take 40 mg by mouth daily.    08/15/2020 thiamine 100 MG tablet Take 1 tablet (100 mg total) by mouth daily. 30 tablet 0   No facility-administered medications prior to visit.     Review of Systems:   Constitutional:   No  weight loss, night sweats,  Fevers, chills,  +fatigue, or  lassitude.  HEENT:  No headaches,  Difficulty swallowing,  Tooth/dental problems, or  Sore throat,                No sneezing, itching, ear ache, nasal congestion, post nasal drip,   CV:  No chest pain,  Orthopnea, PND, swelling in lower extremities, anasarca, dizziness, palpitations, syncope.   GI  No heartburn, indigestion, abdominal pain, nausea, vomiting, diarrhea, change in bowel habits, loss of appetite, bloody stools.   Resp:    No  chest wall deformity  Skin: no rash or lesions.  GU: no dysuria, change in color of urine, no urgency or frequency.  No flank pain, no hematuria   MS:  No joint pain or swelling.  No decreased range of motion.  No back pain.    Physical Exam  BP 128/68 (BP Location: Left Arm)   Pulse 90   Temp (!) 97.5 F (36.4 C)   Ht  (1.854 m)   Wt 175 lb (79.4 kg)   SpO2 98%   BMI 23.09 kg/m   GEN: A/Ox3; pleasant , NAD, well nourished    HEENT:  [place]/AT,  , NOSE-clear, THROAT-clear, no lesions, no postnasal drip or exudate noted.  Poor dentition  NECK:  Supple w/ fair ROM; no JVD; normal carotid impulses w/o bruits; no thyromegaly or nodules palpated; no lymphadenopathy.    RESP  Clear  P & A; w/o, wheezes/ rales/ or rhonchi. no accessory muscle use, no dullness to percussion  CARD:  RRR, no m/r/g, no peripheral edema, pulses intact, no cyanosis or clubbing.  GI:   Soft & nt; nml bowel sounds; no organomegaly or masses detected.   Musco: Warm bil, no deformities or joint swelling noted.   Neuro: alert, no focal deficits noted.    Skin: Warm, no lesions or rashes    Lab Results:  CBC    Component Value Date/Time   WBC 13.9 (H) 06/22/2020 0509   RBC 2.65 (L) 06/22/2020 0509   HGB 8.1 (L) 06/22/2020 0509   HCT 26.9 (L) 06/22/2020 0509   PLT 646 (H) 06/22/2020 0509   MCV 101.5 (H) 06/22/2020 0509   MCH 30.6 06/22/2020 0509   MCHC 30.1 06/22/2020 0509   RDW 14.2 06/22/2020 0509   LYMPHSABS 1.1 06/14/2020 1558   MONOABS 1.5 (H) 06/14/2020 1558   EOSABS 0.0 06/14/2020 1558   BASOSABS 0.0 06/14/2020 1558    BMET    Component Value Date/Time   NA 135 06/22/2020 0509   K 4.3 06/22/2020 0509   CL 102 06/22/2020 0509   CO2 22 06/22/2020 0509   GLUCOSE 103 (H) 06/22/2020 0509   BUN 8 06/22/2020 0509   CREATININE 0.91 06/22/2020 0509   CREATININE 1.48 (H) 02/12/2014 1004   CALCIUM 8.9 06/22/2020 0509   GFRNONAA >60 06/22/2020 0509   GFRNONAA 53 (L) 02/12/2014 1004    GFRAA >60 10/27/2015 0845   GFRAA 61 02/12/2014 1004    BNP No results found for: BNP  ProBNP No results found for: PROBNP  Imaging: CT Head Wo Contrast  Result Date: 06/15/2020 CLINICAL DATA:  65 year old male with fever, left eye vision loss since October following vaccination for COVID-19. EXAM: CT HEAD WITHOUT CONTRAST TECHNIQUE: Contiguous axial images were obtained from the base of the skull through the vertex without intravenous contrast. COMPARISON:  None. FINDINGS: Brain: No midline shift, ventriculomegaly, mass effect, evidence of mass lesion, intracranial hemorrhage or evidence of cortically based acute infarction. Confluent hypodensity in the anterior deep white matter  capsules greater on the left. Mild additional scattered white matter hypodensity. Deep gray nuclei remain within normal limits. But heterogeneity of the pons suggests lacunar infarcts including right paracentral pontine involvement on series 3, image 9). Superimposed small area of cystic encephalomalacia in the posterosuperior left temporal lobe and frontal operculum (series 6, image 51). More subtle right superior temporal lobe encephalomalacia (series 3, image 12). No other cortical encephalomalacia identified. Suprasellar cistern and optic chiasm appear within normal limits. Vascular: Mild Calcified atherosclerosis at the skull base. Skull: Chronic left lamina papyracea fracture. No acute osseous abnormality identified. Sinuses/Orbits: Chronic left lamina papyracea fracture. Paranasal sinuses are well aerated. Tympanic cavities and mastoids appear clear. Other: 7 mm broad-based area of scalp soft tissue thickening along the left posterior convexity (series 4, image 43). Underlying calvarium intact. Other visualized scalp soft tissues are within normal limits. Disconjugate gaze. Orbits soft tissues otherwise appear within normal limits. IMPRESSION: 1. Evidence of small vessel disease including age indeterminate involvement  of the deep white matter capsules and pons. Superimposed left greater than right temporal/operculum chronic encephalomalacia might also be post ischemic. 2. Disconjugate gaze and chronic left lamina papyracea fracture, below but otherwise negative plain CT appearance of the orbits. Electronically Signed   By: Odessa Fleming M.D.   On: 06/15/2020 07:36   CT Angio Chest PE W and/or Wo Contrast  Result Date: 06/15/2020 CLINICAL DATA:  65 year old male with fever and cough. EXAM: CT ANGIOGRAPHY CHEST WITH CONTRAST TECHNIQUE: Multidetector CT imaging of the chest was performed using the standard protocol during bolus administration of intravenous contrast. Multiplanar CT image reconstructions and MIPs were obtained to evaluate the vascular anatomy. CONTRAST:  18mL OMNIPAQUE IOHEXOL 350 MG/ML SOLN COMPARISON:  Portable chest x-ray 06/14/2020. FINDINGS: Cardiovascular: Good contrast bolus timing in the pulmonary arterial tree. No focal filling defect identified in the pulmonary arteries to suggest acute pulmonary embolism. No cardiomegaly or pericardial effusion. Negative visible aorta aside from mild atherosclerosis. Mediastinum/Nodes: Subcentimeter but reactive appearing mediastinal lymph nodes. Similar right hilar nodes. No left hilar lymphadenopathy. Lungs/Pleura: Large area of right lower lobe consolidation from the superior segment through the anterior and medial basal segments. Additional widely scattered lower lobe reticulonodular opacity. Multiple lucent area suspicious for early cavitation in the consolidated lung (series 8, image 65). Mass effect on the lower lobe airways, but the major airways remain patent. The other bilateral pulmonary lobes appear spared at this time; minimal dependent opacity in the left lower lobe. No pneumothorax or pleural effusion. Upper Abdomen: Negative visible liver, gallbladder, spleen, pancreas, adrenal glands, kidneys and bowel in the upper abdomen. Musculoskeletal: No acute or  suspicious osseous lesion. Review of the MIP images confirms the above findings. IMPRESSION: 1. Lobar pneumonia in the right lower lobe with consolidation and suspicion of early cavitation. Consider Necrotizing Pneumonia. No pleural effusion or other complicating features. 2. Negative for acute pulmonary embolus. 3. Reactive appearing mediastinal and right hilar lymph nodes. 4. Aortic Atherosclerosis (ICD10-I70.0). Electronically Signed   By: Odessa Fleming M.D.   On: 06/15/2020 07:41   MR BRAIN WO CONTRAST  Result Date: 06/16/2020 CLINICAL DATA:  Vision loss, monocular. Additional history obtained from prior radiology records: 65 year old male with fever, left eye vision loss since October following vaccination for COVID 19. EXAM: MRI HEAD WIHTOUT CONTRAST AND MRI ORBITS WITHOUT AND WITH CONTRAST TECHNIQUE: Multiplanar, multiecho pulse sequences of the brain and surrounding structures were obtained without intravenous contrast. Multiplanar, multiecho pulse sequences of the orbits and surrounding structures were obtained including  fat saturation techniques, before and after intravenous contrast administration. CONTRAST:  72mL GADAVIST GADOBUTROL 1 MMOL/ML IV SOLN COMPARISON:  Head CT 06/15/2020. FINDINGS: MRI HEAD FINDINGS Brain: Mild intermittent motion degradation. Mild cerebral and cerebellar atrophy. Small foci of chronic cortical encephalomalacia within the left parietal and temporal operculum. Chronic microhemorrhage at the site of left parietal encephalomalacia. Mild-to-moderate multifocal T2/FLAIR hyperintensity within the cerebral white matter, basal ganglia and pons. Additionally, chronic lacunar infarcts are present within the pons. There is no acute infarct. No evidence of intracranial mass. No extra-axial fluid collection. No midline shift. Vascular: Expected proximal arterial flow voids. Skull and upper cervical spine: No focal marrow lesion. MRI ORBITS FINDINGS The examination is intermittently motion  degraded. Most notably, there is moderate motion degradation of the coronal T1 weighted fat saturated postcontrast sequence. Orbits: The globes are normal in size and contour. The extraocular muscles are symmetric and unremarkable. There is T2 hyperintensity throughout the majority of the left optic nerve (series 8, image 11) (series 10, images 17-28). Additionally, there is apparent enhancement of the intraorbital left optic nerve (for instance as seen on series 16, image 24). Portions of the left optic nerve appear atrophic (for instance the pre chiasmatic left optic nerve (series 10, image 18). The right optic nerve sheath complex is unremarkable. Visualized sinuses: Mild paranasal sinus mucosal thickening, most notably ethmoidal. Soft tissues: The visualized maxillofacial and upper neck soft tissues are unremarkable. MRI orbits impression 2 will be called to the ordering clinician or representative by the Radiologist Assistant, and communication documented in the PACS or [employer]. IMPRESSION: MRI brain: 1. No evidence of acute intracranial abnormality. 2. Small foci of chronic cortical encephalomalacia within the left parietal and temporal operculum, likely reflecting remote infarcts. 3. Mild-to-moderate multifocal T2/FLAIR hyperintensity within the cerebral white matter, basal ganglia and pons. Findings are nonspecific but likely at least partially reflect chronic small vessel ischemic disease. Sequela of a second superimposed process (i.e. demyelinating, infectious/inflammatory) cannot be excluded. 4. Chronic pontine lacunar infarcts. 5. Mild generalized atrophy of the brain. MRI orbits: 1. Motion degraded exam. 2. T2 hyperintense signal abnormality involving the majority of the left optic nerve with associated abnormal enhancement. Findings likely reflect sequela of optic neuritis/neuropathy (of indeterminate etiology). While the enhancement likely indicates ongoing active inflammation/demyelination,  portions of the left optic nerve are atrophic and this would suggest somewhat prolonged involvement. Electronically Signed   By: Jackey Loge DO   On: 06/16/2020 15:20   DG CHEST PORT 1 VIEW  Result Date: 06/19/2020 CLINICAL DATA:  Status post bronchoscopy and biopsy EXAM: PORTABLE CHEST 1 VIEW COMPARISON:  06/14/2020, 06/15/2020 FINDINGS: Single frontal view of the chest demonstrates an unremarkable cardiac silhouette. Consolidation within the medial right lung base again noted, compatible with known pneumonia. Trace right pleural effusion has developed in the interim. No pneumothorax after bronchoscopy. The left chest is clear. IMPRESSION: 1. Persistent right basilar consolidation consistent with pneumonia. 2. Trace right pleural effusion. 3. No pneumothorax.  No evidence of complication after bronchoscopy. Electronically Signed   By: Sharlet Salina M.D.   On: 06/19/2020 15:52   DG Chest Portable 1 View  Result Date: 06/14/2020 CLINICAL DATA:  Fever EXAM: PORTABLE CHEST 1 VIEW COMPARISON:  October 27, 2015 FINDINGS: The cardiomediastinal silhouette is unchanged in contour.Tortuous thoracic aorta no pleural effusion. No pneumothorax. Reticulonodular opacities along the RIGHT heart border. LEFT basilar linear opacity. Visualized abdomen is unremarkable. No acute osseous abnormality. IMPRESSION: Reticulonodular opacities along the RIGHT heart border could reflect  underlying atelectasis, aspiration or infection. Electronically Signed   By: Meda KlinefelterStephanie  Peacock MD   On: 06/14/2020 15:56   MR ORBITS W WO CONTRAST  Result Date: 06/16/2020 CLINICAL DATA:  Vision loss, monocular. Additional history obtained from prior radiology records: 65 year old male with fever, left eye vision loss since October following vaccination for COVID 19. EXAM: MRI HEAD WIHTOUT CONTRAST AND MRI ORBITS WITHOUT AND WITH CONTRAST TECHNIQUE: Multiplanar, multiecho pulse sequences of the brain and surrounding structures were obtained without  intravenous contrast. Multiplanar, multiecho pulse sequences of the orbits and surrounding structures were obtained including fat saturation techniques, before and after intravenous contrast administration. CONTRAST:  8mL GADAVIST GADOBUTROL 1 MMOL/ML IV SOLN COMPARISON:  Head CT 06/15/2020. FINDINGS: MRI HEAD FINDINGS Brain: Mild intermittent motion degradation. Mild cerebral and cerebellar atrophy. Small foci of chronic cortical encephalomalacia within the left parietal and temporal operculum. Chronic microhemorrhage at the site of left parietal encephalomalacia. Mild-to-moderate multifocal T2/FLAIR hyperintensity within the cerebral white matter, basal ganglia and pons. Additionally, chronic lacunar infarcts are present within the pons. There is no acute infarct. No evidence of intracranial mass. No extra-axial fluid collection. No midline shift. Vascular: Expected proximal arterial flow voids. Skull and upper cervical spine: No focal marrow lesion. MRI ORBITS FINDINGS The examination is intermittently motion degraded. Most notably, there is moderate motion degradation of the coronal T1 weighted fat saturated postcontrast sequence. Orbits: The globes are normal in size and contour. The extraocular muscles are symmetric and unremarkable. There is T2 hyperintensity throughout the majority of the left optic nerve (series 8, image 11) (series 10, images 17-28). Additionally, there is apparent enhancement of the intraorbital left optic nerve (for instance as seen on series 16, image 24). Portions of the left optic nerve appear atrophic (for instance the pre chiasmatic left optic nerve (series 10, image 18). The right optic nerve sheath complex is unremarkable. Visualized sinuses: Mild paranasal sinus mucosal thickening, most notably ethmoidal. Soft tissues: The visualized maxillofacial and upper neck soft tissues are unremarkable. MRI orbits impression 2 will be called to the ordering clinician or representative by  the Radiologist Assistant, and communication documented in the PACS or [employer]. IMPRESSION: MRI brain: 1. No evidence of acute intracranial abnormality. 2. Small foci of chronic cortical encephalomalacia within the left parietal and temporal operculum, likely reflecting remote infarcts. 3. Mild-to-moderate multifocal T2/FLAIR hyperintensity within the cerebral white matter, basal ganglia and pons. Findings are nonspecific but likely at least partially reflect chronic small vessel ischemic disease. Sequela of a second superimposed process (i.e. demyelinating, infectious/inflammatory) cannot be excluded. 4. Chronic pontine lacunar infarcts. 5. Mild generalized atrophy of the brain. MRI orbits: 1. Motion degraded exam. 2. T2 hyperintense signal abnormality involving the majority of the left optic nerve with associated abnormal enhancement. Findings likely reflect sequela of optic neuritis/neuropathy (of indeterminate etiology). While the enhancement likely indicates ongoing active inflammation/demyelination, portions of the left optic nerve are atrophic and this would suggest somewhat prolonged involvement. Electronically Signed   By: Jackey LogeKyle  Golden DO   On: 06/16/2020 15:20   DG C-ARM BRONCHOSCOPY  Result Date: 06/19/2020 C-ARM BRONCHOSCOPY: Fluoroscopy was utilized by the requesting physician.  No radiographic interpretation.   DG FLUORO GUIDE LUMBAR PUNCTURE  Result Date: 06/17/2020 CLINICAL DATA:  Optic neuritis. EXAM: DIAGNOSTIC LUMBAR PUNCTURE UNDER FLUOROSCOPIC GUIDANCE FLUOROSCOPY TIME:  Fluoroscopy Time:  54 seconds Radiation Exposure Index (if provided by the fluoroscopic device): 18.8 mGy Number of Acquired Spot Images: None PROCEDURE: Informed consent was obtained from the patient prior  to the procedure with a discussion of potential complications including but not limited to headache, allergy, infection, pain, spinal cord/nerve root damage. With the patient prone, the lower back was prepped  with Betadine. 1% Lidocaine was used for local anesthesia. Lumbar puncture was performed at the L4 laminectomy defect using a 20 gauge needle with return of clear CSF. 6 ml of CSF could be obtained for laboratory studies. The patient tolerated the procedure well without immediate post-procedure complication. IMPRESSION: Fluoroscopically-guided lumbar puncture performed at the L4 level. 6 mL of CSF could be obtained and sent for laboratory studies. The patient tolerated the procedure well without immediate post-procedure complication. Electronically Signed   By: Jackey Loge DO   On: 06/17/2020 15:18      No flowsheet data found.  No results found for: NITRICOXIDE      Assessment & Plan:   Cavitary pneumonia Right lower lobe cavitary pneumonia.  Patient has history of alcohol abuse.  Also has very poor dentition. Patient underwent bronchoscopy during hospitalization.  Cytology, pathology and cultures including AFB are negative. Patient is to finish a full course of 4 weeks of Augmentin.  He does have some clinical improvement.  We will check chest x-ray today.  Have patient return back in 3 to 4 weeks with a follow-up chest x-ray.  Plan  Patient Instructions  Eat yogurt daily  Begin Probiotic daily  Finish Augmentin as directed  Follow up with Neurology as planned  Activity as tolerated  Chest xray today  Set up dentist appointment .  Quit smoking and alcohol.  Follow up with Dr. Vassie Loll  In 3-4 weeks with chest xray and As needed   Please contact office for sooner follow up if symptoms do not improve or worsen or seek emergency care         Alcohol dependence Alcohol cessation encouraged  Monocular vision loss Left-sided visual loss questionable etiology.  Patient is continue follow-up with neurology and ophthalmology in the outpatient setting.     Rubye Oaks, NP 06/30/2020

## 2020-06-30 NOTE — Assessment & Plan Note (Signed)
Left-sided visual loss questionable etiology.  Patient is continue follow-up with neurology and ophthalmology in the outpatient setting.

## 2020-06-30 NOTE — Assessment & Plan Note (Signed)
Alcohol cessation encouraged 

## 2020-06-30 NOTE — Assessment & Plan Note (Signed)
Right lower lobe cavitary pneumonia.  Patient has history of alcohol abuse.  Also has very poor dentition. Patient underwent bronchoscopy during hospitalization.  Cytology, pathology and cultures including AFB are negative. Patient is to finish a full course of 4 weeks of Augmentin.  He does have some clinical improvement.  We will check chest x-ray today.  Have patient return back in 3 to 4 weeks with a follow-up chest x-ray.  Plan  Patient Instructions  Eat yogurt daily  Begin Probiotic daily  Finish Augmentin as directed  Follow up with Neurology as planned  Activity as tolerated  Chest xray today  Set up dentist appointment .  Quit smoking and alcohol.  Follow up with Dr. Vassie Loll  In 3-4 weeks with chest xray and As needed   Please contact office for sooner follow up if symptoms do not improve or worsen or seek emergency care

## 2020-06-30 NOTE — Patient Instructions (Addendum)
Eat yogurt daily  Begin Probiotic daily  Finish Augmentin as directed  Follow up with Neurology as planned  Activity as tolerated  Chest xray today  Set up dentist appointment .  Quit smoking and alcohol.  Follow up with Dr. Vassie Loll  In 3-4 weeks with chest xray and As needed   Please contact office for sooner follow up if symptoms do not improve or worsen or seek emergency care

## 2020-07-02 ENCOUNTER — Inpatient Hospital Stay (HOSPITAL_COMMUNITY): Payer: Medicaid Other

## 2020-07-02 ENCOUNTER — Inpatient Hospital Stay (HOSPITAL_COMMUNITY)
Admission: EM | Admit: 2020-07-02 | Discharge: 2020-07-05 | DRG: 178 | Disposition: A | Discharge status: Home / Self Care | Payer: Medicaid Other | Attending: Internal Medicine | Admitting: Internal Medicine

## 2020-07-02 ENCOUNTER — Emergency Department (HOSPITAL_COMMUNITY): Payer: Medicaid Other

## 2020-07-02 ENCOUNTER — Encounter (HOSPITAL_COMMUNITY): Payer: Self-pay | Admitting: Emergency Medicine

## 2020-07-02 ENCOUNTER — Other Ambulatory Visit: Payer: Self-pay

## 2020-07-02 DIAGNOSIS — E871 Hypo-osmolality and hyponatremia: Secondary | ICD-10-CM | POA: Diagnosis present

## 2020-07-02 DIAGNOSIS — N179 Acute kidney failure, unspecified: Secondary | ICD-10-CM | POA: Diagnosis present

## 2020-07-02 DIAGNOSIS — M109 Gout, unspecified: Secondary | ICD-10-CM | POA: Diagnosis present

## 2020-07-02 DIAGNOSIS — R339 Retention of urine, unspecified: Secondary | ICD-10-CM | POA: Diagnosis present

## 2020-07-02 DIAGNOSIS — R197 Diarrhea, unspecified: Secondary | ICD-10-CM | POA: Diagnosis present

## 2020-07-02 DIAGNOSIS — D638 Anemia in other chronic diseases classified elsewhere: Secondary | ICD-10-CM | POA: Diagnosis present

## 2020-07-02 DIAGNOSIS — T361X5A Adverse effect of cephalosporins and other beta-lactam antibiotics, initial encounter: Secondary | ICD-10-CM | POA: Diagnosis present

## 2020-07-02 DIAGNOSIS — Z7151 Drug abuse counseling and surveillance of drug abuser: Secondary | ICD-10-CM

## 2020-07-02 DIAGNOSIS — Z716 Tobacco abuse counseling: Secondary | ICD-10-CM

## 2020-07-02 DIAGNOSIS — R3912 Poor urinary stream: Secondary | ICD-10-CM | POA: Diagnosis present

## 2020-07-02 DIAGNOSIS — Z9689 Presence of other specified functional implants: Secondary | ICD-10-CM | POA: Diagnosis not present

## 2020-07-02 DIAGNOSIS — J850 Gangrene and necrosis of lung: Principal | ICD-10-CM

## 2020-07-02 DIAGNOSIS — G8929 Other chronic pain: Secondary | ICD-10-CM | POA: Diagnosis present

## 2020-07-02 DIAGNOSIS — Z7982 Long term (current) use of aspirin: Secondary | ICD-10-CM | POA: Diagnosis not present

## 2020-07-02 DIAGNOSIS — Z8249 Family history of ischemic heart disease and other diseases of the circulatory system: Secondary | ICD-10-CM

## 2020-07-02 DIAGNOSIS — J189 Pneumonia, unspecified organism: Secondary | ICD-10-CM | POA: Diagnosis not present

## 2020-07-02 DIAGNOSIS — Z20822 Contact with and (suspected) exposure to covid-19: Secondary | ICD-10-CM | POA: Diagnosis present

## 2020-07-02 DIAGNOSIS — N281 Cyst of kidney, acquired: Secondary | ICD-10-CM | POA: Diagnosis present

## 2020-07-02 DIAGNOSIS — D75839 Thrombocytosis, unspecified: Secondary | ICD-10-CM | POA: Diagnosis present

## 2020-07-02 DIAGNOSIS — M1611 Unilateral primary osteoarthritis, right hip: Secondary | ICD-10-CM | POA: Diagnosis present

## 2020-07-02 DIAGNOSIS — I119 Hypertensive heart disease without heart failure: Secondary | ICD-10-CM | POA: Diagnosis present

## 2020-07-02 DIAGNOSIS — Z888 Allergy status to other drugs, medicaments and biological substances status: Secondary | ICD-10-CM

## 2020-07-02 DIAGNOSIS — F1910 Other psychoactive substance abuse, uncomplicated: Secondary | ICD-10-CM | POA: Diagnosis not present

## 2020-07-02 DIAGNOSIS — J918 Pleural effusion in other conditions classified elsewhere: Secondary | ICD-10-CM | POA: Diagnosis present

## 2020-07-02 DIAGNOSIS — J9600 Acute respiratory failure, unspecified whether with hypoxia or hypercapnia: Secondary | ICD-10-CM | POA: Diagnosis not present

## 2020-07-02 DIAGNOSIS — E872 Acidosis: Secondary | ICD-10-CM | POA: Diagnosis present

## 2020-07-02 DIAGNOSIS — H5462 Unqualified visual loss, left eye, normal vision right eye: Secondary | ICD-10-CM | POA: Diagnosis present

## 2020-07-02 DIAGNOSIS — H469 Unspecified optic neuritis: Secondary | ICD-10-CM | POA: Diagnosis not present

## 2020-07-02 DIAGNOSIS — Z79899 Other long term (current) drug therapy: Secondary | ICD-10-CM | POA: Diagnosis not present

## 2020-07-02 DIAGNOSIS — J90 Pleural effusion, not elsewhere classified: Secondary | ICD-10-CM | POA: Diagnosis not present

## 2020-07-02 DIAGNOSIS — F17210 Nicotine dependence, cigarettes, uncomplicated: Secondary | ICD-10-CM | POA: Diagnosis present

## 2020-07-02 DIAGNOSIS — R0603 Acute respiratory distress: Secondary | ICD-10-CM

## 2020-07-02 HISTORY — PX: IR THORACENTESIS ASP PLEURAL SPACE W/IMG GUIDE: IMG5380

## 2020-07-02 LAB — URINALYSIS, ROUTINE W REFLEX MICROSCOPIC
Bilirubin Urine: NEGATIVE
Glucose, UA: NEGATIVE mg/dL
Hgb urine dipstick: NEGATIVE
Ketones, ur: NEGATIVE mg/dL
Leukocytes,Ua: NEGATIVE
Nitrite: NEGATIVE
Protein, ur: NEGATIVE mg/dL
Specific Gravity, Urine: 1.015 (ref 1.005–1.030)
pH: 7 (ref 5.0–8.0)

## 2020-07-02 LAB — COMPREHENSIVE METABOLIC PANEL
ALT: 19 U/L (ref 0–44)
AST: 19 U/L (ref 15–41)
Albumin: 2.6 g/dL — ABNORMAL LOW (ref 3.5–5.0)
Alkaline Phosphatase: 61 U/L (ref 38–126)
Anion gap: 16 — ABNORMAL HIGH (ref 5–15)
BUN: 20 mg/dL (ref 8–23)
CO2: 22 mmol/L (ref 22–32)
Calcium: 9.2 mg/dL (ref 8.9–10.3)
Chloride: 95 mmol/L — ABNORMAL LOW (ref 98–111)
Creatinine, Ser: 1.81 mg/dL — ABNORMAL HIGH (ref 0.61–1.24)
GFR, Estimated: 42 mL/min — ABNORMAL LOW (ref >60)
Glucose, Bld: 121 mg/dL — ABNORMAL HIGH (ref 70–99)
Potassium: 4.9 mmol/L (ref 3.5–5.1)
Sodium: 133 mmol/L — ABNORMAL LOW (ref 135–145)
Total Bilirubin: 0.4 mg/dL (ref 0.3–1.2)
Total Protein: 8.5 g/dL — ABNORMAL HIGH (ref 6.5–8.1)

## 2020-07-02 LAB — CBC WITH DIFFERENTIAL/PLATELET
Abs Immature Granulocytes: 0.05 10*3/uL (ref 0.00–0.07)
Basophils Absolute: 0.1 10*3/uL (ref 0.0–0.1)
Basophils Relative: 1 %
Eosinophils Absolute: 0.2 10*3/uL (ref 0.0–0.5)
Eosinophils Relative: 2 %
HCT: 31 % — ABNORMAL LOW (ref 39.0–52.0)
Hemoglobin: 9.1 g/dL — ABNORMAL LOW (ref 13.0–17.0)
Immature Granulocytes: 1 %
Lymphocytes Relative: 20 %
Lymphs Abs: 2 10*3/uL (ref 0.7–4.0)
MCH: 28.8 pg (ref 26.0–34.0)
MCHC: 29.4 g/dL — ABNORMAL LOW (ref 30.0–36.0)
MCV: 98.1 fL (ref 80.0–100.0)
Monocytes Absolute: 0.8 10*3/uL (ref 0.1–1.0)
Monocytes Relative: 8 %
Neutro Abs: 7.1 10*3/uL (ref 1.7–7.7)
Neutrophils Relative %: 68 %
Platelets: 697 10*3/uL — ABNORMAL HIGH (ref 150–400)
RBC: 3.16 MIL/uL — ABNORMAL LOW (ref 4.22–5.81)
RDW: 15.4 % (ref 11.5–15.5)
WBC: 10.2 10*3/uL (ref 4.0–10.5)
nRBC: 0 % (ref 0.0–0.2)

## 2020-07-02 LAB — PROTEIN, PLEURAL OR PERITONEAL FLUID: Total protein, fluid: 5.8 g/dL

## 2020-07-02 LAB — GLUCOSE, PLEURAL OR PERITONEAL FLUID: Glucose, Fluid: 86 mg/dL

## 2020-07-02 LAB — ALBUMIN, PLEURAL OR PERITONEAL FLUID: Albumin, Fluid: 2.1 g/dL

## 2020-07-02 LAB — BODY FLUID CELL COUNT WITH DIFFERENTIAL
Eos, Fluid: 1 %
Lymphs, Fluid: 67 %
Monocyte-Macrophage-Serous Fluid: 20 % — ABNORMAL LOW (ref 50–90)
Neutrophil Count, Fluid: 12 % (ref 0–25)
Total Nucleated Cell Count, Fluid: 584 cu mm (ref 0–1000)

## 2020-07-02 LAB — LACTATE DEHYDROGENASE, PLEURAL OR PERITONEAL FLUID: LD, Fluid: 291 U/L — ABNORMAL HIGH (ref 3–23)

## 2020-07-02 LAB — CREATININE, URINE, RANDOM: Creatinine, Urine: 186.76 mg/dL

## 2020-07-02 LAB — LACTIC ACID, PLASMA
Lactic Acid, Venous: 2 mmol/L (ref 0.5–1.9)
Lactic Acid, Venous: 1.2 mmol/L (ref 0.5–1.9)

## 2020-07-02 LAB — SODIUM, URINE, RANDOM: Sodium, Ur: 66 mmol/L

## 2020-07-02 LAB — RESP PANEL BY RT-PCR (FLU A&B, COVID) ARPGX2
Influenza A by PCR: NEGATIVE
Influenza B by PCR: NEGATIVE
SARS Coronavirus 2 by RT PCR: NEGATIVE

## 2020-07-02 MED ORDER — LIDOCAINE HCL 1 % IJ SOLN
INTRAMUSCULAR | Status: AC
  Filled 2020-07-02: qty 20

## 2020-07-02 MED ORDER — SODIUM CHLORIDE 0.9 % IV BOLUS
500.0000 mL | Freq: Once | INTRAVENOUS | Status: AC
  Administered 2020-07-02: 500 mL via INTRAVENOUS

## 2020-07-02 MED ORDER — ASPIRIN EC 81 MG PO TBEC
81.0000 mg | DELAYED_RELEASE_TABLET | Freq: Every day | ORAL | Status: DC
Start: 2020-07-02 — End: 2020-07-06
  Administered 2020-07-02 – 2020-07-05 (×4): 81 mg via ORAL
  Filled 2020-07-02 (×4): qty 1

## 2020-07-02 MED ORDER — LIDOCAINE HCL 1 % IJ SOLN
INTRAMUSCULAR | Status: DC | PRN
  Administered 2020-07-02: 10 mL

## 2020-07-02 MED ORDER — ACETAMINOPHEN 650 MG RE SUPP: 650.0000 mg | Freq: Four times a day (QID) | RECTAL | Status: DC | PRN

## 2020-07-02 MED ORDER — THIAMINE HCL 100 MG PO TABS
100.0000 mg | ORAL_TABLET | Freq: Every day | ORAL | Status: DC
  Administered 2020-07-02 – 2020-07-05 (×4): 100 mg via ORAL
  Filled 2020-07-02 (×4): qty 1

## 2020-07-02 MED ORDER — ACETAMINOPHEN 325 MG PO TABS: 650.0000 mg | ORAL_TABLET | Freq: Four times a day (QID) | ORAL | Status: DC | PRN

## 2020-07-02 MED ORDER — FOLIC ACID 1 MG PO TABS
1.0000 mg | ORAL_TABLET | Freq: Every day | ORAL | Status: DC
  Administered 2020-07-02 – 2020-07-05 (×4): 1 mg via ORAL
  Filled 2020-07-02 (×4): qty 1

## 2020-07-02 MED ORDER — ENOXAPARIN SODIUM 40 MG/0.4ML ~~LOC~~ SOLN
40.0000 mg | SUBCUTANEOUS | Status: DC
  Filled 2020-07-02 (×2): qty 0.4

## 2020-07-02 MED ORDER — SODIUM CHLORIDE 0.9 % IV SOLN
1.0000 g | Freq: Two times a day (BID) | INTRAVENOUS | Status: DC
  Administered 2020-07-02 – 2020-07-04 (×5): 1 g via INTRAVENOUS
  Filled 2020-07-02 (×7): qty 1

## 2020-07-02 NOTE — Progress Notes (Signed)
Pt arrived on unit and was able to transfer Independently from stretcher to bed. Pt had shirt, shoes, pants, cell phone and wallet.

## 2020-07-02 NOTE — ED Triage Notes (Signed)
Patient recently discharged from hospital and then sent back to ED from PCP for necrotizing pneumonia that is not improving. Patient complains of shortness of breath, denies pain, denies other symptoms.

## 2020-07-02 NOTE — Procedures (Signed)
PROCEDURE SUMMARY:  Limited US Chest shows large pocket of fluid with septations vs. Loculations.  Successful US guided diagnostic and therapeutic right thoracentesis. Yielded 850 mL of amber fluid. Pt tolerated procedure well, however procedure stopped prior to removal of all fluid due to forceful cough.  No immediate complications.  Specimen was sent for labs. CXR ordered.  EBL < 5 mL  Hoyt Koch PA-C 07/02/2020 4:22 PM

## 2020-07-02 NOTE — ED Notes (Signed)
Critical lactic acid result given to [place], PA, no new orders.

## 2020-07-02 NOTE — H&P (Signed)
Date: 07/02/2020               Patient Name:  [name] [name] MRN: [id number]  DOB: May 09, 1956 Age / Sex: 65 y.o., male   PCP: Anselm Jungling, NP         Medical Service: Internal Medicine Teaching Service         Attending Physician: Dr. Reymundo Poll, MD    First Contact: Dr. Marijo Conception Pager: [phone]  Second Contact: Dr. Ephriam Knuckles Pager: [phone]       After Hours (After 5p/  First Contact Pager: [phone]  weekends / holidays): Second Contact Pager: [phone]   Chief Complaint: dyspnea  History of Present Illness:  [name] is 64yo person with hypertension, chronic alcohol use, recent admission for lobar pneumonia with early cavitation presenting to [facility] 2/23 for dyspnea. Patient recently admitted 2/5-2/13 for pneumonia with concern for TB and optic neuritis, discharged on Augmentin with follow-ups planned with neurology, pulmonology, and ID. Patient reports he felt okay in the first few days after discharge, but then began having dyspnea on exertion, which has progressively worsened. States he can only walk a few feet currently. He has not noticed much cough. Denies chest pain, palpitations, abdominal pain, nausea, vomiting, leg swelling. Of note, states he has noticed his urine "dribbling" recently, denies dysuria. He also endorses diarrhea since starting Augmentin. Reports compliance w/ antibiotic except for this morning. He reports 2-3 watery, non-bloody BM daily.  Of note, patiently followed up with pulmonologist on 06/30/20. At that time, chest x-ray was performed. Patient states the pulmonologist called him for fluid on his lungs, and he needed to go to ED.  Meds:  Augmentin 875-125mg  TID ASA 81mg  QD Folice acid qd Lisinopril 40mg  qd Thiamine 100mg  qd  Allergies: Allergies as of 07/02/2020 - Review Complete 07/02/2020  Allergen Reaction Noted  . Hctz [hydrochlorothiazide]  08/29/2011   Past Medical History:  Diagnosis Date  . Arthritis    right hip  . Disc  displacement, lumbar   . Gout   . Hypertension    Takes medications daily   Family History:  Multiple family members with hypertension.  Social History: Works with homeless population. Lives alone in apartment without pets. Smokes 3-4 cigarettes daily, roughly 40oz alcohol weekly, regular marijuana use.  Review of Systems: A complete ROS was negative except as per HPI.   Physical Exam: Blood pressure 129/79, pulse 88, temperature 98.5 F (36.9 C), temperature source Oral, resp. rate (!) 26, height 6\' 1"  (1.854 m), weight 79.3 kg, SpO2 92 %. General: Pleasant, laying in bed, no acute distress HENT: Normocephalic, atraumatic. Dry mucous membranes. No JVD. Eyes: Disconjugate gaze. Limited left eye vision. CV: Regular rate, rhythm. No m/r/g appreciated. Pulm: No air movement on R to mid-lung field. No wheezing, rales, rhonchi appreciated. GI: Soft, non-tender, non-distended. No fluid wave. Normoactive bowel sounds. Skin: Warm, dry. No rashes or lesions noted. MSK: Normal bulk, tone. No pitting edema bilaterally. Neuro: Awake, alert, oriented x4. Moving extremities appropriately. Psych: Normal mood, speech, affect.  CXR: personally reviewed my interpretation is right sided pleural effusion, similar to previous study on 2/21.  Assessment & Plan by Problem: [name] is 64yo person with hypertension, chronic alcohol use, recent admission for lobar pneumonia with early cavitation admitted 2/23 for right-sided pleural effusion.  Active Problems:   Pleural effusion  #Right-sided pleural effusion Patient presenting with new right-sided pleural effusion after discharge and >1 week of antibiotic therapy. During last admission, TB was  considered given patient's risk factors and cavitary lesion on lobar pneumonia. AFB smear x4 negative, culture still pending. Can also consider malignancy, hypothyroidism, autoimmune disease. Cannot rule-out heart failure, although less likely given  unilateral effusion. Will hold off Echo pending lab results from thoracentesis. Although patient has history of chronic alcohol use, no signs of cirrhosis as LFT's normal, no hepatomegaly or fluid wave on exam. Will obtain labs from thoracentesis in AM. - Thoracentesis ordered - F/u pleural fluid studies, TSH - D/c augmentin, start meropenem - Trending procal   #Acute kidney injury #Anion gap metabolic acidosis On arrival, sCr 1..81 w/ GFR 42. At baseline, sCr ~1.0. Possibly multi-factorial, patient has had GI losses with diarrhea and is endorsing obstructive symptoms. ED giving 500cc fluids, will also check bladder scan, UA, renal ultrasound. If no obstruction, will start maintenance fluids. - F/u bladder scan, UA, renal ultrasound - Daily BMP - Strict I&O's - Avoid nephrotoxic meds   #Diarrhea Diarrhea appears to have started around the same time as Augmentin. Reports 2-3 non-bloody, watery BM/day. Will switch antibiotics, and if diarrhea does not resolve will obtain GI studies.  - Hold augmentin - Start meropenem  #Normocytic anemia Appears stable from previous admission. Previous anemia studies normal B12, folate. Iron studies consistent with anemia of chronic disease/inflammation. No obvious signs of bleeding on exam.  - CBC daily  #Tobacco use disorder Will start nicotine patch, needs smoking cessation. - Nicotine patch - Smoking cessation counseling  Dispo: Admit patient to Inpatient with expected length of stay greater than 2 midnights.  Signed: Evlyn Kanner, MD 07/02/2020, 1:04 PM  Pager: [phone] After 5pm on weekdays and 1pm on weekends: On Call pager: [phone]

## 2020-07-02 NOTE — ED Provider Notes (Signed)
[facility] EMERGENCY DEPARTMENT Provider Note   CSN: [id number] Arrival date & time: 07/02/20  1108     History Chief Complaint  Patient presents with  . Pneumonia    [name] [name] is a 65 y.o. male.  Patient with history of hypertension, admission to the hospital 2/5-2/13/2022 for right lower lobe cavitary/necrotizing pneumonia, evaluated for TB during hospitalization but had 3  negative acid-fast smears (currently awaiting acid-fast culture), discharged to home on several additional weeks of Augmentin with which he has been compliant --presents the emergency department today for evaluation of shortness of breath and a new right-sided pleural effusion.  Patient was seen by pulmonology 2 days ago and had repeat chest x-ray done.  Patient states that he has been feeling very rough with increasing shortness of breath since his hospitalization.  He denies pain or fevers.  He gives an example of this morning feeling hungry and wanting to eat breakfast, however he was unable to get up and prepare breakfast himself due to profound shortness of breath.  He states that pulmonology recommended that he come to the hospital yesterday, however he wanted to speak with his PCP this morning and then decided to come to the hospital.  No lower extremity swelling.  No history of heart failure.  The onset of this condition was acute. The course is worsening. Aggravating factors: activity. Alleviating factors: none.          Past Medical History:  Diagnosis Date  . Arthritis    right hip  . Disc displacement, lumbar   . Gout   . Hypertension    Takes medications daily    Patient Active Problem List   Diagnosis Date Noted  . Abscess of lung with pneumonia (HCC)   . Monocular vision loss   . Cavitary pneumonia   . Optic neuritis   . Necrotizing pneumonia (HCC) 06/15/2020  . Alcohol dependence (HCC) 05/20/2014  . Hypertension 04/30/2011  . Spinal stenosis, lumbar region, with  neurogenic claudication 02/01/2011  . Hepatitis C virus infection without hepatic coma 02/01/2011  . Chronic pain 02/01/2011  . HIP PAIN, RIGHT 02/02/2010    Past Surgical History:  Procedure Laterality Date  . BACK SURGERY  1980s   x2:2013one back surgery  . BRONCHIAL BIOPSY  06/19/2020   Procedure: BRONCHIAL BIOPSIES;  Surgeon: Oretha Milch, MD;  Location: [facility] ENDOSCOPY;  Service: Cardiopulmonary;;  . BRONCHIAL BRUSHINGS  06/19/2020   Procedure: BRONCHIAL BRUSHINGS;  Surgeon: Oretha Milch, MD;  Location: [facility] ENDOSCOPY;  Service: Cardiopulmonary;;  . BRONCHIAL WASHINGS  06/19/2020   Procedure: BRONCHIAL WASHINGS;  Surgeon: Oretha Milch, MD;  Location: [facility] ENDOSCOPY;  Service: Cardiopulmonary;;  . COLONOSCOPY WITH PROPOFOL N/A 09/18/2013   Procedure: COLONOSCOPY WITH PROPOFOL;  Surgeon: Charolett Bumpers, MD;  Location: [facility] ENDOSCOPY;  Service: Endoscopy;  Laterality: N/A;  . VIDEO BRONCHOSCOPY N/A 06/19/2020   Procedure: VIDEO BRONCHOSCOPY WITH FLUORO;  Surgeon: Oretha Milch, MD;  Location: [facility] ENDOSCOPY;  Service: Cardiopulmonary;  Laterality: N/A;       Family History  Problem Relation Age of Onset  . Hypertension Mother     Social History   Tobacco Use  . Smoking status: Current Every Day Smoker    Packs/day: 0.30    Years: 35.00    Pack years: 10.50    Types: Cigarettes  . Smokeless tobacco: Never Used  Substance Use Topics  . Alcohol use: Yes    Alcohol/week: 0.0 standard drinks    Comment:  occasionally  . Drug use: Yes    Types: Marijuana, Cocaine    Comment: last used 3-4 days ago    Home Medications Prior to Admission medications   Medication Sig Start Date End Date Taking? Authorizing Provider  amoxicillin-clavulanate (AUGMENTIN) 875-125 MG tablet Take 1 tablet by mouth 2 (two) times daily for 21 days. 06/22/20 07/13/20  Evlyn Kanner, MD  aspirin EC 81 MG EC tablet Take 1 tablet (81 mg total) by mouth daily. Swallow whole. 06/22/20   Evlyn Kanner, MD   folic acid (FOLVITE) 1 MG tablet Take 1 tablet (1 mg total) by mouth daily. 06/22/20   Evlyn Kanner, MD  lisinopril (ZESTRIL) 40 MG tablet Take 40 mg by mouth daily.    [provider]  thiamine 100 MG tablet Take 1 tablet (100 mg total) by mouth daily. 06/22/20   Evlyn Kanner, MD  hydrochlorothiazide (HYDRODIURIL) 25 MG tablet Take 1 tablet (25 mg total) by mouth daily. 05/17/11 07/26/11  Nestor Ramp, MD    Allergies    Hctz [hydrochlorothiazide]  Review of Systems   Review of Systems  Constitutional: Negative for fever.  HENT: Negative for rhinorrhea and sore throat.   Eyes: Negative for redness.  Respiratory: Positive for shortness of breath. Negative for cough.   Cardiovascular: Negative for chest pain and leg swelling.  Gastrointestinal: Negative for abdominal pain, diarrhea, nausea and vomiting.  Genitourinary: Negative for dysuria and hematuria.  Musculoskeletal: Negative for myalgias.  Skin: Negative for rash.  Neurological: Negative for headaches.    Physical Exam Updated Vital Signs BP 103/75 (BP Location: Left Arm)   Pulse (!) 109   Temp 98.5 F (36.9 C) (Oral)   Resp 20   SpO2 98%   Physical Exam Vitals and nursing note reviewed.  Constitutional:      Appearance: He is well-developed and well-nourished.  HENT:     Head: Normocephalic and atraumatic.  Eyes:     General:        Right eye: No discharge.        Left eye: No discharge.     Conjunctiva/sclera: Conjunctivae normal.  Cardiovascular:     Rate and Rhythm: Regular rhythm. Tachycardia present.     Heart sounds: Normal heart sounds.  Pulmonary:     Effort: Pulmonary effort is normal. Tachypnea present. No accessory muscle usage or respiratory distress.     Breath sounds: Examination of the right-middle field reveals decreased breath sounds. Examination of the right-lower field reveals decreased breath sounds. Decreased breath sounds present.  Abdominal:     Palpations: Abdomen is  soft.     Tenderness: There is no abdominal tenderness.  Musculoskeletal:     Cervical back: Normal range of motion and neck supple.  Skin:    General: Skin is warm and dry.  Neurological:     Mental Status: He is alert.  Psychiatric:        Mood and Affect: Mood and affect normal.     ED Results / Procedures / Treatments   Labs (all labs ordered are listed, but only abnormal results are displayed) Labs Reviewed  LACTIC ACID, PLASMA - Abnormal; Notable for the following components:      Result Value   Lactic Acid, Venous 2.0 (*)    All other components within normal limits  COMPREHENSIVE METABOLIC PANEL - Abnormal; Notable for the following components:   Sodium 133 (*)    Chloride 95 (*)    Glucose, Bld 121 (*)  Creatinine, Ser 1.81 (*)    Total Protein 8.5 (*)    Albumin 2.6 (*)    GFR, Estimated 42 (*)    Anion gap 16 (*)    All other components within normal limits  CBC WITH DIFFERENTIAL/PLATELET - Abnormal; Notable for the following components:   RBC 3.16 (*)    Hemoglobin 9.1 (*)    HCT 31.0 (*)    MCHC 29.4 (*)    Platelets 697 (*)    All other components within normal limits  RESP PANEL BY RT-PCR (FLU A&B, COVID) ARPGX2  LACTIC ACID, PLASMA  URINALYSIS, ROUTINE W REFLEX MICROSCOPIC    EKG None  Radiology DG Chest 2 View  Result Date: 07/01/2020 CLINICAL DATA:  Cough, necrotizing pneumonia EXAM: CHEST - 2 VIEW COMPARISON:  Chest radiograph 06/19/2020 FINDINGS: Normal cardiac silhouette. New large RIGHT effusion occupying half the RIGHT hemithorax. RIGHT lung base poorly evaluated. RIGHT upper lobe clear. LEFT lung clear. No acute osseous abnormality. IMPRESSION: 1. New large RIGHT effusion. 2. RIGHT lung base poorly evaluated. 3. LEFT lung clear. Electronically Signed   By: Genevive Bi M.D.   On: 07/01/2020 15:21    Procedures Procedures   Medications Ordered in ED Medications  sodium chloride 0.9 % bolus 500 mL (has no administration in time  range)    ED Course  I have reviewed the triage vital signs and the nursing notes.  Pertinent labs & imaging results that were available during my care of the patient were reviewed by me and considered in my medical decision making (see chart for details).  Patient seen and examined. Work-up initiated.  He will need to be readmitted to the hospital and likely have the pleural effusion drained.  He is very symptomatic with tachypnea and tachycardia and do not feel that arranging for outpatient thoracentesis would be best plan at this point.  Vital signs reviewed and are as follows: BP 103/75 (BP Location: Left Arm)   Pulse (!) 109   Temp 98.5 F (36.9 C) (Oral)   Resp 20   Ht 6\' 1"  (1.854 m)   Wt 79.3 kg   SpO2 98%   BMI 23.07 kg/m   12:31 PM Lactic acid noted 2.0. Doubt sepsis at this time. He is on treatment with Augmentin for previously known PNA. Suspect he is symptomatic from his pleural effusion.   12:52 PM AKI noted on chemistry. 500cc fluid bolus ordered. Anemia present but improved from previous. WBC normal range. Will discuss with IMTS for admission.   1:10 PM Discussed with IMTS who will see.     MDM Rules/Calculators/A&P                          Admit.    Final Clinical Impression(s) / ED Diagnoses Final diagnoses:  Pleural effusion  Acute kidney injury (HCC)  Necrotizing pneumonia [facility])    Rx / DC Orders ED Discharge Orders    None       [facility], Renne Crigler 07/02/20 1310    07/04/20, MD 07/02/20 1447

## 2020-07-02 NOTE — Progress Notes (Signed)
Pharmacy Antibiotic Note  [name] [name] is a 65 y.o. male admitted on 07/02/2020 with pneumonia.  Pharmacy has been consulted for Meropenem dosing.  Plan: - Meropenem 1g Q12h  - Monitor renal function and can increase to q8h if CrCL > 50 ml/min  - De-escalate abx when appropriate    Height: 6\' 1"  (185.4 cm) Weight: 79.3 kg (174 lb 13.2 oz) IBW/kg (Calculated) : 79.9  Temp (24hrs), Avg:98.5 F (36.9 C), Min:98.5 F (36.9 C), Max:98.5 F (36.9 C)  Recent Labs  Lab 07/02/20 1133 07/02/20 1342  WBC 10.2  --   CREATININE 1.81*  --   LATICACIDVEN 2.0* 1.2    Estimated Creatinine Clearance: 47.5 mL/min (A) (by C-G formula based on SCr of 1.81 mg/dL (H)).    Allergies  Allergen Reactions  . Hctz [Hydrochlorothiazide] Other (See Comments)    Erectile dysfunction    Antimicrobials this admission: 2/23 Meoepenem >>   PTA Augmentin   Dose adjustments this admission:   Microbiology results: N/a  Thank you for allowing pharmacy to be a part of this patient's care.  3/23 07/02/2020 2:54 PM

## 2020-07-02 NOTE — ED Notes (Signed)
Patient transported to Ultrasound 

## 2020-07-02 NOTE — ED Notes (Signed)
Pt returned from radiology.

## 2020-07-02 NOTE — ED Notes (Signed)
Pt given ice chips, ok by admitting team

## 2020-07-02 NOTE — Progress Notes (Signed)
Tammy has already called patient and given results/recommendations.  Nothing further needed.

## 2020-07-03 ENCOUNTER — Encounter (HOSPITAL_COMMUNITY)
Admission: EM | Disposition: A | Discharge status: Home / Self Care | Payer: Self-pay | Source: Home / Self Care | Attending: Internal Medicine

## 2020-07-03 ENCOUNTER — Encounter (HOSPITAL_COMMUNITY): Payer: Self-pay | Admitting: Internal Medicine

## 2020-07-03 ENCOUNTER — Inpatient Hospital Stay (HOSPITAL_COMMUNITY): Payer: Medicaid Other

## 2020-07-03 DIAGNOSIS — J189 Pneumonia, unspecified organism: Secondary | ICD-10-CM

## 2020-07-03 DIAGNOSIS — H469 Unspecified optic neuritis: Secondary | ICD-10-CM

## 2020-07-03 DIAGNOSIS — J918 Pleural effusion in other conditions classified elsewhere: Secondary | ICD-10-CM | POA: Diagnosis not present

## 2020-07-03 DIAGNOSIS — D638 Anemia in other chronic diseases classified elsewhere: Secondary | ICD-10-CM | POA: Diagnosis present

## 2020-07-03 HISTORY — PX: CHEST TUBE INSERTION: SHX231

## 2020-07-03 LAB — COMPREHENSIVE METABOLIC PANEL
ALT: 15 U/L (ref 0–44)
AST: 18 U/L (ref 15–41)
Albumin: 2.2 g/dL — ABNORMAL LOW (ref 3.5–5.0)
Alkaline Phosphatase: 51 U/L (ref 38–126)
Anion gap: 11 (ref 5–15)
BUN: 19 mg/dL (ref 8–23)
CO2: 24 mmol/L (ref 22–32)
Calcium: 9.2 mg/dL (ref 8.9–10.3)
Chloride: 99 mmol/L (ref 98–111)
Creatinine, Ser: 1.48 mg/dL — ABNORMAL HIGH (ref 0.61–1.24)
GFR, Estimated: 53 mL/min — ABNORMAL LOW (ref >60)
Glucose, Bld: 93 mg/dL (ref 70–99)
Potassium: 5.1 mmol/L (ref 3.5–5.1)
Sodium: 134 mmol/L — ABNORMAL LOW (ref 135–145)
Total Bilirubin: 0.6 mg/dL (ref 0.3–1.2)
Total Protein: 7.4 g/dL (ref 6.5–8.1)

## 2020-07-03 LAB — CBC WITH DIFFERENTIAL/PLATELET
Abs Immature Granulocytes: 0.02 K/uL (ref 0.00–0.07)
Basophils Absolute: 0.1 K/uL (ref 0.0–0.1)
Basophils Relative: 1 %
Eosinophils Absolute: 0.3 K/uL (ref 0.0–0.5)
Eosinophils Relative: 3 %
HCT: 27.5 % — ABNORMAL LOW (ref 39.0–52.0)
Hemoglobin: 8.3 g/dL — ABNORMAL LOW (ref 13.0–17.0)
Immature Granulocytes: 0 %
Lymphocytes Relative: 24 %
Lymphs Abs: 2.2 K/uL (ref 0.7–4.0)
MCH: 29.1 pg (ref 26.0–34.0)
MCHC: 30.2 g/dL (ref 30.0–36.0)
MCV: 96.5 fL (ref 80.0–100.0)
Monocytes Absolute: 0.7 K/uL (ref 0.1–1.0)
Monocytes Relative: 7 %
Neutro Abs: 6 K/uL (ref 1.7–7.7)
Neutrophils Relative %: 65 %
Platelets: 620 K/uL — ABNORMAL HIGH (ref 150–400)
RBC: 2.85 MIL/uL — ABNORMAL LOW (ref 4.22–5.81)
RDW: 15.7 % — ABNORMAL HIGH (ref 11.5–15.5)
WBC: 9.2 K/uL (ref 4.0–10.5)
nRBC: 0 % (ref 0.0–0.2)

## 2020-07-03 LAB — RAPID URINE DRUG SCREEN, HOSP PERFORMED
Amphetamines: NOT DETECTED
Barbiturates: NOT DETECTED
Benzodiazepines: NOT DETECTED
Cocaine: POSITIVE — AB
Opiates: NOT DETECTED
Tetrahydrocannabinol: NOT DETECTED

## 2020-07-03 LAB — TSH: TSH: 1.835 u[IU]/mL (ref 0.350–4.500)

## 2020-07-03 LAB — GRAM STAIN

## 2020-07-03 LAB — AMYLASE, PLEURAL OR PERITONEAL FLUID: Amylase, Fluid: 49 U/L

## 2020-07-03 LAB — TRIGLYCERIDES, BODY FLUIDS: Triglycerides, Fluid: 44 mg/dL

## 2020-07-03 LAB — PROCALCITONIN: Procalcitonin: <0.1 ng/mL

## 2020-07-03 LAB — MAGNESIUM: Magnesium: 1.9 mg/dL (ref 1.7–2.4)

## 2020-07-03 LAB — LACTATE DEHYDROGENASE: LDH: 85 U/L — ABNORMAL LOW (ref 98–192)

## 2020-07-03 LAB — PSA: Prostatic Specific Antigen: 0.75 ng/mL (ref 0.00–4.00)

## 2020-07-03 SURGERY — CHEST TUBE INSERTION
Anesthesia: Moderate Sedation

## 2020-07-03 MED ORDER — OXYCODONE-ACETAMINOPHEN 5-325 MG PO TABS
1.0000 | ORAL_TABLET | ORAL | Status: DC | PRN
  Administered 2020-07-03 (×3): 1 via ORAL
  Administered 2020-07-04 – 2020-07-05 (×5): 2 via ORAL
  Administered 2020-07-05: 1 via ORAL
  Administered 2020-07-05: 2 via ORAL
  Filled 2020-07-03 (×2): qty 2
  Filled 2020-07-03: qty 1
  Filled 2020-07-03 (×6): qty 2

## 2020-07-03 MED ORDER — RAMELTEON 8 MG PO TABS
8.0000 mg | ORAL_TABLET | Freq: Every day | ORAL | Status: DC
  Administered 2020-07-03 – 2020-07-04 (×2): 8 mg via ORAL
  Filled 2020-07-03 (×4): qty 1

## 2020-07-03 MED ORDER — SODIUM CHLORIDE 0.9% FLUSH
10.0000 mL | Freq: Three times a day (TID) | INTRAVENOUS | Status: DC
  Administered 2020-07-03 – 2020-07-05 (×6): 10 mL

## 2020-07-03 MED ORDER — LACTATED RINGERS IV SOLN: INTRAVENOUS | Status: AC

## 2020-07-03 MED ORDER — TAMSULOSIN HCL 0.4 MG PO CAPS
0.4000 mg | ORAL_CAPSULE | Freq: Every day | ORAL | Status: DC
  Administered 2020-07-03 – 2020-07-05 (×3): 0.4 mg via ORAL
  Filled 2020-07-03 (×3): qty 1

## 2020-07-03 NOTE — Consult Note (Signed)
NAME:  [name] [name], MRN:  [id number], DOB:  [birth year], LOS: 1 ADMISSION DATE:  07/02/2020, CONSULTATION DATE:  07/03/2020 REFERRING MD:  Dr. Antony Contras, CHIEF COMPLAINT:  Loculated pleural effusion    Brief History:  65 year old male readmitted with dyspnea on exertion and nonproductive cough. Of note patient recently admitted to this facility from 2/5-2/13 for lobar pneumonia with early cavitary presentation with concern for TB and optic neuritis.  Discharged on p.o. Augmentin for 4 weeks.  History of Present Illness:  [name] [name] is a 65 year old male with a past medical history significant for arthritis, gout, hypertension, tobacco abuse, and alcohol use who presented to the emergency department with complaints of dyspnea on exertion and nonproductive cough.  Of note patient was recently treated at this facility from 2/52/13 for right lower lobe lobar pneumonia with early cavitation with concern for TB and optic neuritis.  Patient was discharged with p.o. Augmentin for 4 weeks.  He was seen and an hour clinic by [facility] for follow-up to right lower lobe cavitary pneumonia, chest x-ray obtained and revealed new significant, loculated appearing, right lower lobe pleural effusion.  He was instructed to present to the emergency department for further work-up.  On arrival patient was seen in no acute distress with normal vital signs.  Lab work on arrival significant for sodium 133, creatinine 1.81, albumin 2.6, lactic 2.0, and hemoglobin 9.1.  Repeat chest x-ray on arrival revealed no change in moderate large right pleural effusion and airspace disease.  Personal review of chest x-ray with attending physician revealed likely evidence of loculation.  Patient underwent thoracentesis per IR with 850 mL of amber-colored fluid removed.  PCCM consulted 2/24 for further assistance in management  Past Medical History:  Arthritis Gout Hypertension Tobacco use Alcohol use  Significant Hospital Events:   Admitted 06/23/2020  Consults:  Interventional radiology  Procedures:  IR guided thoracentesis 2/23 > 850 mL of amber-colored fluid removed  Significant Diagnostic Tests:   Chest x-ray 2/23 > no change in moderate large right pleural effusion with airspace disease   Renal ultrasound 2/23 > clustered small cyst lower pole left kidney and complex right pleural effusion with multiple septations  Micro Data:  COVID 2/23 > negative Pleural fluid culture 2/23 >  MRSA PCR 2/3 >  AFB 2/23 > Gram stain pleural fluid 2/23 > few WBC present predominantly mononuclear no organisms seen  Antimicrobials:  Meropenem 2/23 >   Interim History / Subjective:  Seen lying in bed with no acute complaints Requesting something to eat for breakfast Reports significant improvement in dyspnea post thoracentesis  Objective   Blood pressure 116/60, pulse 91, temperature 98.3 F (36.8 C), temperature source Oral, resp. rate 18, height 6\' 1"  (1.854 m), weight 78.3 kg, SpO2 99 %.        Intake/Output Summary (Last 24 hours) at 07/03/2020 0858 Last data filed at 07/02/2020 1401 Gross per 24 hour  Intake 500 ml  Output --  Net 500 ml   Filed Weights   07/02/20 1206 07/03/20 0533  Weight: 79.3 kg 78.3 kg    Examination: General: Middle-aged male lying in bed in no acute distress HEENT: [place]/AT, MM pink/moist Neuro: Alert and oriented x3, nonfocal CV: s1s2 regular rate and rhythm, no murmur, rubs, or gallops,  PULM: Slightly diminished right base, no added breath sounds, oxygen saturations appropriate on room air, no increased work of breathing GI: soft, bowel sounds active in all 4 quadrants, non-tender, non-distended,  Extremities: warm/dry, no edema  Skin:  no rashes or lesions  Resolved Hospital Problem list     Assessment & Plan:  Right lobar pneumonia with early cavitary  -Discharged on p.o. Augmentin for 4 weeks. Concern for TB and optic neuritis -Acid-fast culture from 2/10 remains  pending Moderate to large loculated right pleural effusion -Seen on chest x-ray post pulmonary clinic follow-up -Underwent thoracentesis per IR 2/23 with an IVP mL fluid removed P: Chest x-ray images reviewed with attending and evidence of loculations seen Bedside ultrasound this morning also reveals evidence of loculation Patient will need small bore chest tube and likely utilization of intrapleural lytics Will discuss with attending timing for chest tube placement  Continue broad-spectrum IV antibiotics Follow culture results Encourage pulmonary hygiene   Best practice (evaluated daily)  Per primary  Labs   CBC: Recent Labs  Lab 07/02/20 1133 07/03/20 0227  WBC 10.2 9.2  NEUTROABS 7.1 6.0  HGB 9.1* 8.3*  HCT 31.0* 27.5*  MCV 98.1 96.5  PLT 697* 620*    Basic Metabolic Panel: Recent Labs  Lab 07/02/20 1133 07/03/20 0227  NA 133* 134*  K 4.9 5.1  CL 95* 99  CO2 22 24  GLUCOSE 121* 93  BUN 20 19  CREATININE 1.81* 1.48*  CALCIUM 9.2 9.2  MG  --  1.9   GFR: Estimated Creatinine Clearance: 57.3 mL/min (A) (by C-G formula based on SCr of 1.48 mg/dL (H)). Recent Labs  Lab 07/02/20 1133 07/02/20 1342 07/03/20 0227  PROCALCITON  --   --  <0.10  WBC 10.2  --  9.2  LATICACIDVEN 2.0* 1.2  --     Liver Function Tests: Recent Labs  Lab 07/02/20 1133 07/03/20 0227  AST 19 18  ALT 19 15  ALKPHOS 61 51  BILITOT 0.4 0.6  PROT 8.5* 7.4  ALBUMIN 2.6* 2.2*   No results for input(s): LIPASE, AMYLASE in the last 168 hours. No results for input(s): AMMONIA in the last 168 hours.  ABG No results found for: PHART, PCO2ART, PO2ART, HCO3, TCO2, ACIDBASEDEF, O2SAT   Coagulation Profile: No results for input(s): INR, PROTIME in the last 168 hours.  Cardiac Enzymes: No results for input(s): CKTOTAL, CKMB, CKMBINDEX, TROPONINI in the last 168 hours.  HbA1C: Hgb A1c MFr Bld  Date/Time Value Ref Range Status  06/18/2020 06:53 AM 4.6 (L) 4.8 - 5.6 % Final    Comment:     (NOTE) Pre diabetes:          5.7%-6.4%  Diabetes:              >6.4%  Glycemic control for   <7.0% adults with diabetes     CBG: No results for input(s): GLUCAP in the last 168 hours.  Review of Systems:   Gen: Denies fever, chills, weight change, fatigue, night sweats HEENT: Denies blurred vision, double vision, hearing loss, tinnitus, sinus congestion, rhinorrhea, sore throat, neck stiffness, dysphagia PULM: Denies shortness of breath, cough, sputum production, hemoptysis, wheezing CV: Denies chest pain, edema, orthopnea, paroxysmal nocturnal dyspnea, palpitations GI: Denies abdominal pain, nausea, vomiting, diarrhea, hematochezia, melena, constipation, change in bowel habits GU: Denies dysuria, hematuria, polyuria, oliguria, urethral discharge Endocrine: Denies hot or cold intolerance, polyuria, polyphagia or appetite change Derm: Denies rash, dry skin, scaling or peeling skin change Heme: Denies easy bruising, bleeding, bleeding gums Neuro: Denies headache, numbness, weakness, slurred speech, loss of memory or consciousness  Past Medical History:  He,  has a past medical history of Arthritis, Disc displacement, lumbar, Gout, and Hypertension.  Surgical History:   Past Surgical History:  Procedure Laterality Date  . BACK SURGERY  1980s   x2:2013one back surgery  . BRONCHIAL BIOPSY  06/19/2020   Procedure: BRONCHIAL BIOPSIES;  Surgeon: Oretha Milch, MD;  Location: [facility] ENDOSCOPY;  Service: Cardiopulmonary;;  . BRONCHIAL BRUSHINGS  06/19/2020   Procedure: BRONCHIAL BRUSHINGS;  Surgeon: Oretha Milch, MD;  Location: [facility] ENDOSCOPY;  Service: Cardiopulmonary;;  . BRONCHIAL WASHINGS  06/19/2020   Procedure: BRONCHIAL WASHINGS;  Surgeon: Oretha Milch, MD;  Location: [facility] ENDOSCOPY;  Service: Cardiopulmonary;;  . COLONOSCOPY WITH PROPOFOL N/A 09/18/2013   Procedure: COLONOSCOPY WITH PROPOFOL;  Surgeon: Charolett Bumpers, MD;  Location: [facility] ENDOSCOPY;  Service: Endoscopy;   Laterality: N/A;  . IR THORACENTESIS ASP PLEURAL SPACE W/IMG GUIDE  07/02/2020  . VIDEO BRONCHOSCOPY N/A 06/19/2020   Procedure: VIDEO BRONCHOSCOPY WITH FLUORO;  Surgeon: Oretha Milch, MD;  Location: [facility] ENDOSCOPY;  Service: Cardiopulmonary;  Laterality: N/A;     Social History:   reports that he has been smoking cigarettes. He has a 10.50 pack-year smoking history. He has never used smokeless tobacco. He reports current alcohol use. He reports current drug use. Drugs: Marijuana and Cocaine.   Family History:  His family history includes Hypertension in his mother.   Allergies Allergies  Allergen Reactions  . Hctz [Hydrochlorothiazide] Other (See Comments)    Erectile dysfunction     Home Medications  Prior to Admission medications   Medication Sig Start Date End Date Taking? Authorizing Provider  amoxicillin-clavulanate (AUGMENTIN) 875-125 MG tablet Take 1 tablet by mouth 2 (two) times daily for 21 days. 06/22/20 07/13/20 Yes Evlyn Kanner, MD  aspirin EC 81 MG EC tablet Take 1 tablet (81 mg total) by mouth daily. Swallow whole. 06/22/20  Yes Evlyn Kanner, MD  folic acid (FOLVITE) 1 MG tablet Take 1 tablet (1 mg total) by mouth daily. 06/22/20  Yes Evlyn Kanner, MD  lisinopril (ZESTRIL) 40 MG tablet Take 40 mg by mouth daily.   Yes [provider]  thiamine 100 MG tablet Take 1 tablet (100 mg total) by mouth daily. 06/22/20  Yes Evlyn Kanner, MD  hydrochlorothiazide (HYDRODIURIL) 25 MG tablet Take 1 tablet (25 mg total) by mouth daily. 05/17/11 07/26/11  Nestor Ramp, MD     Signature:   Delfin Gant, NP-C [place] Personal contact information can be found on Amion  If no response please page: Adult pulmonary and critical care medicine pager on Amion unitl 7pm After 7pm please call [phone] 07/03/2020, 9:24 AM

## 2020-07-03 NOTE — Procedures (Signed)
Insertion of Chest Tube Procedure Note  [name] [name]  [id number]  04/11/1956  Date:07/03/20  Time:3:02 PM    Provider Performing: Elige Radon   Procedure: Pleural Catheter Insertion w/ Imaging Guidance ([id number])  Indication(s) Effusion  Consent Risks of the procedure as well as the alternatives and risks of each were explained to the patient and/or caregiver.  Consent for the procedure was obtained and is signed in the bedside chart  Anesthesia Topical only with 1% lidocaine    Time Out Verified patient identification, verified procedure, site/side was marked, verified correct patient position, special equipment/implants available, medications/allergies/relevant history reviewed, required imaging and test results available.   Sterile Technique Maximal sterile technique including full sterile barrier drape, hand hygiene, sterile gown, sterile gloves, mask, hair covering, sterile ultrasound probe cover (if used).   Procedure Description Ultrasound used to identify appropriate pleural anatomy for placement and overlying skin marked. Area of placement cleaned and draped in sterile fashion.  A 14 French pigtail pleural catheter was placed into the right pleural space using Seldinger technique. Appropriate return of fluid was obtained.  The tube was connected to atrium and placed on -20 cm H2O wall suction.   Complications/Tolerance None; patient tolerated the procedure well. Chest X-ray is ordered to verify placement.   EBL Minimal  Specimen(s) none  Elige Radon, MD Internal Medicine Resident PGY-2 [facility] Pager: [phone] 07/03/2020 3:04 PM

## 2020-07-03 NOTE — Plan of Care (Signed)

## 2020-07-03 NOTE — Progress Notes (Signed)
Subjective:  Patient evaluated at bedside this AM. States he is feeling better since the procedure yesterday, no issues afterward. Reports roughly 20lb weight loss since the start of this illness in January. Denies previous or current night sweats, chest pain, palpitations. Does endorse chills at home prior to arrival.  Objective:  Vital signs in last 24 hours: Vitals:   07/02/20 1500 07/02/20 1654 07/02/20 2110 07/03/20 0533  BP: 121/78 125/90 112/77   Pulse:  84 84   Resp:  20 20   Temp:  98.1 F (36.7 C) 98.1 F (36.7 C)   TempSrc:   Oral   SpO2:  99% 97%   Weight:    78.3 kg  Height:       Physical Exam: General: Laying in bed, no acute distress CV: Regular rate, rhythm. No murmurs, rubs, gallops Pulm: Minimal air movement on R to mid-lung fields. L lung field without wheezing, rales, rhonchi. Neuro: Awake, alert, oriented x4. Moving extremities appropriately.   CBC Latest Ref Rng & Units 07/03/2020 07/02/2020 06/22/2020  WBC 4.0 - 10.5 K/uL 9.2 10.2 13.9(H)  Hemoglobin 13.0 - 17.0 g/dL 8.3(L) 9.1(L) 8.1(L)  Hematocrit 39.0 - 52.0 % 27.5(L) 31.0(L) 26.9(L)  Platelets 150 - 400 K/uL 620(H) 697(H) 646(H)   CMP Latest Ref Rng & Units 07/03/2020 07/02/2020 06/22/2020  Glucose 70 - 99 mg/dL 93 240(X) 735(H)  BUN 8 - 23 mg/dL 19 20 8   Creatinine 0.61 - 1.24 mg/dL ) 2.99(M) 4.26(S  Sodium 135 - 145 mmol/L 134(L) 133(L) 135  Potassium 3.5 - 5.1 mmol/L 5.1 4.9 4.3  Chloride 98 - 111 mmol/L 99 95(L) 102  CO2 22 - 32 mmol/L 24 22 22   Calcium 8.9 - 10.3 mg/dL 9.2 9.2 8.9  Total Protein 6.5 - 8.1 g/dL 7.4 3.41) -  Total Bilirubin 0.3 - 1.2 mg/dL 0.6 0.4 -  Alkaline Phos 38 - 126 U/L 51 61 -  AST 15 - 41 U/L 18 19 -  ALT 0 - 44 U/L 15 19 -   Assessment/Plan: Mr. Arvo Ealy is 65yo person with hypertension, chronic alcohol use, recent admission for lobar pneumonia with early cavitation admitted 2/23 for right-sided pleural effusion, concerning for possible malignancy vs  infection.  Active Problems:   Pleural effusion  #Right-sided parapneumonic effusion Yesterday patient underwent thoracentesis with 850cc removed before stopping d/t forceful coughing. Studies consistent with exudative effusion, 3/3 Light's criteria. Main concern for infection vs malignancy. Patient initially presented last admission febrile with leukocytosis concerning for infectious etiology. CTA chest did not reveal findings concerning for malignancy. Cytology and cultures from last admission negative for TB, malignancy. Procalcitonin negative. Per chart review, patient has had 40lb weight loss over the last few years, no B symptoms. Patient also has tobacco use disorder and currently smokes. However, given acuity of recent lobar pneumonia, most likely parapneumonic effusion. Pulmonology consulted, possibly will need chest tube given loculation of effusion. Will continue with meropenem, discuss with ID for further recommendations for atypical coverage. - Appreciate pulmonology, ID recs - C/w meropenem  #Acute kidney injury #Anion gap metabolic acidosis, resolved Further studies yesterday and overnight revealed no hydronephrosis or signs of post-renal involvement with AKI. FeNa 0.5, indicating most likely pre-renal etiology. sCr 1.8 > 1.4, GFR 42>53. Started on maintenance fluids overnight x12 hr. Will re-assess need for fluids tomorrow AM. - Daily BMP - Strict I&O's - Avoid nephrotoxic meds  #Possible obstructive uropathy Patient reports previously feeling like he cannot empty his bladder and has weak stream.  No known history of prostate cancer or BPH. Denies family hx prostate cancer. Yesterday, renal ultrasound without hydronephrosis, bladder scan ~150. PSA checked this AM, normal. Will start flomax and monitor daily. - Tamsulosin 0.4mg  qd  #Normocytic anemia Stable, no signs of obvious bleed. Minimal EBL from yesterday's procedure. Will continue to monitor. - Daily CBC  DIET:  Regular IVF: LR 75/hr x12h DVT PPX: Lovenox BOWEL: n/a CODE: FULL FAM COM: n/a  Prior to Admission Living Arrangement: Home Anticipated Discharge Location: Home Barriers to Discharge: medical management Dispo: Anticipated discharge in approximately 1-3 day(s).   Evlyn Kanner, MD 07/03/2020, 5:47 AM Pager: [phone] After 5pm on weekdays and 1pm on weekends: On Call pager [phone]

## 2020-07-04 ENCOUNTER — Inpatient Hospital Stay (HOSPITAL_COMMUNITY): Payer: Medicaid Other

## 2020-07-04 ENCOUNTER — Encounter (HOSPITAL_COMMUNITY): Payer: Self-pay | Admitting: Pulmonary Disease

## 2020-07-04 DIAGNOSIS — J918 Pleural effusion in other conditions classified elsewhere: Secondary | ICD-10-CM | POA: Diagnosis not present

## 2020-07-04 DIAGNOSIS — J9600 Acute respiratory failure, unspecified whether with hypoxia or hypercapnia: Secondary | ICD-10-CM

## 2020-07-04 DIAGNOSIS — J189 Pneumonia, unspecified organism: Secondary | ICD-10-CM | POA: Diagnosis not present

## 2020-07-04 DIAGNOSIS — D638 Anemia in other chronic diseases classified elsewhere: Secondary | ICD-10-CM

## 2020-07-04 DIAGNOSIS — Z9689 Presence of other specified functional implants: Secondary | ICD-10-CM

## 2020-07-04 DIAGNOSIS — J90 Pleural effusion, not elsewhere classified: Secondary | ICD-10-CM | POA: Diagnosis present

## 2020-07-04 LAB — BASIC METABOLIC PANEL
Anion gap: 13 (ref 5–15)
BUN: 16 mg/dL (ref 8–23)
CO2: 21 mmol/L — ABNORMAL LOW (ref 22–32)
Calcium: 9.3 mg/dL (ref 8.9–10.3)
Chloride: 100 mmol/L (ref 98–111)
Creatinine, Ser: 1.25 mg/dL — ABNORMAL HIGH (ref 0.61–1.24)
GFR, Estimated: >60 mL/min (ref >60)
Glucose, Bld: 104 mg/dL — ABNORMAL HIGH (ref 70–99)
Potassium: 5.2 mmol/L — ABNORMAL HIGH (ref 3.5–5.1)
Sodium: 134 mmol/L — ABNORMAL LOW (ref 135–145)

## 2020-07-04 LAB — CBC
HCT: 26.7 % — ABNORMAL LOW (ref 39.0–52.0)
Hemoglobin: 8.1 g/dL — ABNORMAL LOW (ref 13.0–17.0)
MCH: 29.7 pg (ref 26.0–34.0)
MCHC: 30.3 g/dL (ref 30.0–36.0)
MCV: 97.8 fL (ref 80.0–100.0)
Platelets: 537 10*3/uL — ABNORMAL HIGH (ref 150–400)
RBC: 2.73 MIL/uL — ABNORMAL LOW (ref 4.22–5.81)
RDW: 15.8 % — ABNORMAL HIGH (ref 11.5–15.5)
WBC: 5.9 10*3/uL (ref 4.0–10.5)
nRBC: 0 % (ref 0.0–0.2)

## 2020-07-04 LAB — CYTOLOGY - NON PAP

## 2020-07-04 LAB — PH, BODY FLUID: pH, Body Fluid: 7.7

## 2020-07-04 LAB — ACID FAST SMEAR (AFB, MYCOBACTERIA): Acid Fast Smear: NEGATIVE

## 2020-07-04 MED ORDER — SODIUM CHLORIDE 0.9 % IV SOLN
1.0000 g | Freq: Three times a day (TID) | INTRAVENOUS | Status: DC
  Administered 2020-07-04 – 2020-07-05 (×4): 1 g via INTRAVENOUS
  Filled 2020-07-04 (×6): qty 1

## 2020-07-04 MED ORDER — SODIUM CHLORIDE (PF) 0.9 % IJ SOLN
10.0000 mg | Freq: Once | INTRAMUSCULAR | Status: DC
  Filled 2020-07-04: qty 10

## 2020-07-04 MED ORDER — STERILE WATER FOR INJECTION IJ SOLN
5.0000 mg | Freq: Once | RESPIRATORY_TRACT | Status: DC
  Filled 2020-07-04: qty 5

## 2020-07-04 MED ORDER — SODIUM ZIRCONIUM CYCLOSILICATE 5 G PO PACK
5.0000 g | PACK | Freq: Every day | ORAL | Status: DC
  Administered 2020-07-04: 5 g via ORAL
  Filled 2020-07-04 (×2): qty 1

## 2020-07-04 NOTE — Plan of Care (Signed)

## 2020-07-04 NOTE — Progress Notes (Signed)
   NAME:  [name] [name], MRN:  [id number], DOB:  March 20, 1956, LOS: 2 ADMISSION DATE:  07/02/2020, CONSULTATION DATE:  07/03/2020 REFERRING MD:  Dr. Antony Contras, CHIEF COMPLAINT:  Loculated pleural effusion    Brief History:  65 year old male readmitted with dyspnea on exertion and nonproductive cough. Of note patient recently admitted to this facility from 2/5-2/13 for lobar pneumonia with early cavitary presentation with concern for TB and optic neuritis.  Discharged on p.o. Augmentin for 4 weeks.  Then developed loculated effusion on the right lower lobe and presented for admission.  management  Past Medical History:  Arthritis Gout Hypertension Tobacco use Alcohol use  Significant Hospital Events:  2/23 Admit, thoracentesis by IR 2/24 Chest tube placement by PCCM 2/25 First dose DNase, TPA right chest tube.  Consults:  Interventional radiology, PCCM  Procedures:  IR guided thoracentesis 2/23 > 850 mL of amber-colored fluid removed Right chest tube 2/24  Significant Diagnostic Tests:   Chest x-ray 2/23 > no change in moderate large right pleural effusion with airspace disease   Renal ultrasound 2/23 > clustered small cyst lower pole left kidney and complex right pleural effusion with multiple septations  Micro Data:  COVID 2/23 > negative Pleural fluid culture 2/23 >  MRSA PCR 2/3 >  AFB 2/23 > Gram stain pleural fluid 2/23 > few WBC present predominantly mononuclear no organisms seen  Antimicrobials:  Meropenem 2/23 >   Interim History / Subjective:   States that breathing is improved 820 cc output from chest tube  Objective   Blood pressure 99/66, pulse 62, temperature 98.2 F (36.8 C), resp. rate 20, height 6\' 1"  (1.854 m), weight 78.3 kg, SpO2 98 %.        Intake/Output Summary (Last 24 hours) at 07/04/2020 1548 Last data filed at 07/04/2020 1116 Gross per 24 hour  Intake 1830.14 ml  Output 2830 ml  Net -999.86 ml   Filed Weights   07/02/20 1206 07/03/20  0533  Weight: 79.3 kg 78.3 kg    Examination: Gen:      No acute distress HEENT:  EOMI, sclera anicteric Neck:     No masses; no thyromegaly Lungs:    Clear to auscultation bilaterally; normal respiratory effort CV:         Regular rate and rhythm; no murmurs Abd:      + bowel sounds; soft, non-tender; no palpable masses, no distension Ext:    No edema; adequate peripheral perfusion Skin:      Warm and dry; no rash Neuro: alert and oriented x 3  Resolved Hospital Problem list     Assessment & Plan:  Right lobar pneumonia with early cavitary  -Discharged on p.o. Augmentin for 4 weeks. -Acid-fast culture from 2/10 remains pending Moderate to large loculated right pleural effusion -Underwent thoracentesis per IR 2/23 with an IVP mL fluid removed, chest tube placed on 2/24 P: Continue antibiotics, follow cultures First dose DNase TPA today  Best practice (evaluated daily)  Per primary  Signature:    3/24 MD [place] [facility] See Amion for pager  If no response to pager , please call [phone] until 7pm After 7:00 pm call Elink  [phone] 07/04/2020, 3:48 PM

## 2020-07-04 NOTE — Procedures (Signed)
Pleural Fibrinolytic Administration Procedure Note  [name] [name]  [id number]  Aug 03, 1955  Date:07/04/20  Time:3:56 PM   Provider Performing:Aundrey Elahi   Procedure: Pleural Fibrinolysis Initial day [id number])  Indication(s) Fibrinolysis of complicated pleural effusion  Consent Risks of the procedure as well as the alternatives and risks of each were explained to the patient and/or caregiver.  Consent for the procedure was obtained.   Anesthesia None   Time Out Verified patient identification, verified procedure, site/side was marked, verified correct patient position, special equipment/implants available, medications/allergies/relevant history reviewed, required imaging and test results available.   Sterile Technique Hand hygiene, gloves   Procedure Description Existing pleural catheter was cleaned and accessed in sterile manner.  10mg  of tPA in 30cc of saline and 5mg  of dornase in 30cc of sterile water were injected into pleural space using existing pleural catheter.  Catheter will be clamped for 1 hour and then placed back to suction.   Complications/Tolerance None; patient tolerated the procedure well.   EBL None   Specimen(s) None  MD [place] [facility] See Amion for pager  If no response to pager , please call [phone] until 7pm After 7:00 pm call Elink  [phone] 07/04/2020, 3:56 PM

## 2020-07-04 NOTE — Consult Note (Signed)
[facility]                                                                                        Patient Identification: Patient Name: [name] [name] MRN: [id number] [place] Date: 07/02/2020 11:22 AM Today's Date: 07/04/2020 Reason for consult:  Requesting provider:   Active Problems:   Parapneumonic effusion   Anemia of chronic disease   Antibiotics: Meropenem 2/23-  Lines/Tubes: PIVs, Rt sided chest tube   Assessment 31 Y O male with PMH of HTN, alcohol use, UDS positive for cocaine, recent hospital admission 2/6-2/3  for lobar PNA with cavitation/left optic neuritis  with extensive unremarkable ID and autoimmune work up who presented to the ED on 2/23 for chest xray new Rt large pleural effusion. He was discharged on Augmentin for 3 weeks last admission which he was taking prior to this admission  He had an extensive ID and autoimmune work up approx 2 weeks ago that was unremarkable except a positive quantiferon but no other evidence concerning for TB.   He is afebrile, no leukocytosis and hemodynamically stable at this point. Recent pleural cultures have been no growth. His is not immunocompromised. He feels much better after he underwent  thoracentesis.   Recommendations  Given no infective cause found after extensive work up including a negative Procalcitonin along with patient being clinically stable with significant improvement in SOB after thoracentesis, I doubt there is additional advantage of continuing further broad spectrum  antibiotics.   Recommend to DC meropenem and finish off prior recommended course of Augmentin last admission  Chest tube management per Pulm  Patient already has a FU appointment with Dr Gale Journey on [facility] on 3/9 at 2:45 pm  Will sign off for now.   Rest of the management as per the primary team. Please call with questions or concerns.  Thank you for the  consult _________________________________________________________________________________________________________ HPI and Hospital Course: 66 Y O male with PMH of HTN, alcohol use, UDS positive for cocaine, recent hospital admission for lobar PNA with cavitation who presented to the ED on 2/23 with SOB.   Patient was initially admitted 2/6-2/13 when he presented with N/V/cough and myalgia a/w fevers. He was seen by ID and Pulmonary at that time with extensive  infectious work up ( see below)  that was essentially negative. He also had left eye vision loss since October which reportedly started less than a week after first [place] vaccination in October. discharged on Augmentin with follow-ups planned with neurology, pulmonology, and ID.   After hospital discharge, he felt okay in the first few days but then began having dyspnea on exertion, progressively worsened. He was taking Augmentin as prescribed and was having some loose stools associated with it. Cough was minimal. Denies any fevers, chill, sweats. Denies nausea. Vomiting and abdominal pain. Followed up with pulmonologist on 06/30/20. At that time, chest x-ray was performed which showed pleural effusion and was advised to go to ED.  Patient underwent Chest tube insertion on 2/24 with no growth in cultures. AFB cx pending   Socia//ID h/o  Born in [place]. No recent travel h/o except traveled  to [place] 2 years ago. Denies travel outside of the country. Denies any pets at home. Heterosexual, last sexual activity several years ago. Denies any joint pain and rashes. Denies any loss of appetite., loss of around 18 lbs since 2 months. Denies any GU symptoms and back pain.  Worked with cars and different jobs. Says " jack of all and master one none". Smokes 10 cigarettes a day since the age of 53. Smokes marijuana. Denies alcohol use  Infective work up  06/17/20 Quantiferon positive 06/17/20 CSF AFB smear negative *2, no organisms in Gram stain and no  growth in cx, CSF VDRL NR, RPR NR  06/17/20 sputum MTB Rif negative for M Tb  06/17/20 sputum cx Normal respiratory flora 06/19/20 BAL cx Normal respiratory flora and mixed anaerobic flora  07/02/20 Rt pleural fluid cx NG, no organisms in Gram stain 07/02/20 SARScov 2 negative  06/19/20 Transbronchial biopsy: " Fragment of inflamed bronchial wall with suppurative inflammation. Negative for granulomas or malignancy    2/6 HIV NR 2/9 Crypto ag negative, Histoplasma ag negative 06/18/20 Blastomyces ag not detected  06/18/20 Coccidiodes ab <1:2 2/8 HCV not detected, HCV ab reactive 06/17/20 hep B core total ab reactive, hep B surface ag NR, hep B surface ab 566  Auto immune work up  06/17/20 ANA ab negative  ANCA <3.5 CCP antibodies IgG 5 MPO abs <9 RF negative c ANCA <1:2 pANCA <1:2 ENA Sm ab <1:2 Lyme disease negative   ROS: General- Denies fever, chills, loss of appetite. LOSS OF WEIGHT HEENT - Denies headache, neck pain, sinus pain, BLURRY VISION IN THE LEFT EYE CHRONIC Chest - Denies any chest pain, SOB AND COUGH + CVS- Denies any dizziness/lightheadedness, syncopal attacks, palpitations Abdomen- Denies any nausea, vomiting, abdominal pain, hematochezia and diarrhea Neuro - Denies any weakness, numbness, tingling sensation Psych - Denies any changes in mood irritability or depressive symptoms GU- Denies any burning, dysuria, hematuria or increased frequency of urination Skin - denies any rashes/lesions MSK - denies any joint pain/swelling or restricted ROM   Past Medical History:  Diagnosis Date  . Arthritis    right hip  . Disc displacement, lumbar   . Gout   . Hypertension    Takes medications daily   Past Surgical History:  Procedure Laterality Date  . BACK SURGERY  1980s   x2:2013one back surgery  . BRONCHIAL BIOPSY  06/19/2020   Procedure: BRONCHIAL BIOPSIES;  Surgeon: Rigoberto Noel, MD;  Location: [facility];  Service: Cardiopulmonary;;  . BRONCHIAL BRUSHINGS   06/19/2020   Procedure: BRONCHIAL BRUSHINGS;  Surgeon: Rigoberto Noel, MD;  Location: [facility];  Service: Cardiopulmonary;;  . BRONCHIAL WASHINGS  06/19/2020   Procedure: BRONCHIAL WASHINGS;  Surgeon: Rigoberto Noel, MD;  Location: [facility];  Service: Cardiopulmonary;;  . CHEST TUBE INSERTION N/A 07/03/2020   Procedure: CHEST TUBE INSERTION;  Surgeon: Marshell Garfinkel, MD;  Location: [facility];  Service: Cardiopulmonary;  Laterality: N/A;  . COLONOSCOPY WITH PROPOFOL N/A 09/18/2013   Procedure: COLONOSCOPY WITH PROPOFOL;  Surgeon: Garlan Fair, MD;  Location: [facility];  Service: Endoscopy;  Laterality: N/A;  . IR THORACENTESIS ASP PLEURAL SPACE W/IMG GUIDE  07/02/2020  . VIDEO BRONCHOSCOPY N/A 06/19/2020   Procedure: VIDEO BRONCHOSCOPY WITH FLUORO;  Surgeon: Rigoberto Noel, MD;  Location: [facility];  Service: Cardiopulmonary;  Laterality: N/A;      Scheduled Meds: . alteplase (TPA) for intrapleural administration  10 mg Intrapleural Once   And  . pulmozyme (DORNASE)  for intrapleural administration  5 mg Intrapleural Once  . aspirin EC  81 mg Oral Daily  . enoxaparin (LOVENOX) injection  40 mg Subcutaneous Q24H  . folic acid  1 mg Oral Daily  . ramelteon  8 mg Oral QHS  . sodium chloride flush  10 mL Intracatheter Q8H  . sodium zirconium cyclosilicate  5 g Oral Daily  . tamsulosin  0.4 mg Oral Daily  . thiamine  100 mg Oral Daily   Continuous Infusions: . meropenem (MERREM) IV 1 g (07/04/20 1228)   PRN Meds:.acetaminophen **OR** acetaminophen, lidocaine, oxyCODONE-acetaminophen  Allergies  Allergen Reactions  . Hctz [Hydrochlorothiazide] Other (See Comments)    Erectile dysfunction   Social History   Socioeconomic History  . Marital status: Single    Spouse name: Not on file  . Number of children: Not on file  . Years of education: Not on file  . Highest education level: Not on file  Occupational History  . Not on file  Tobacco Use  . Smoking status:  Current Every Day Smoker    Packs/day: 0.30    Years: 35.00    Pack years: 10.50    Types: Cigarettes  . Smokeless tobacco: Never Used  Substance and Sexual Activity  . Alcohol use: Yes    Alcohol/week: 0.0 standard drinks    Comment: occasionally  . Drug use: Yes    Types: Marijuana, Cocaine    Comment: last used 3-4 days ago  . Sexual activity: Not Currently  Other Topics Concern  . Not on file  Social History Narrative  . Not on file   Social Determinants of Health   Financial Resource Strain: Not on file  Food Insecurity: Not on file  Transportation Needs: Not on file  Physical Activity: Not on file  Stress: Not on file  Social Connections: Not on file  Intimate Partner Violence: Not on file    Vitals  BP 99/66 (BP Location: Right Arm)   Pulse 62   Temp 98.2 F (36.8 C)   Resp 20   Ht _0  (1.854 m)   Wt 78.3 kg   SpO2 98%   BMI 22.77 kg/m    Physical Exam Adult male sitting in bed, not in respiratory distress  PERRLA, no pallor and no jaundice Mouth - no thrush, ppor dental hygiene Chest - decreased air entry in the rt side, rales + CVS- Normal s1s2, RRR Abdomen - soft, NT Extremities - no edema Skin - no rashes Neuro - grossly non focal   Pertinent Microbiology Results for orders placed or performed during the hospital encounter of 07/02/20  Resp Panel by RT-PCR (Flu A&B, Covid) Nasopharyngeal Swab     Status: None   Collection Time: 07/02/20 12:02 PM   Specimen: Nasopharyngeal Swab; Nasopharyngeal(NP) swabs in vial transport medium  Result Value Ref Range Status   SARS Coronavirus 2 by RT PCR NEGATIVE NEGATIVE Final    Comment: (NOTE) SARS-CoV-2 target nucleic acids are NOT DETECTED.  The SARS-CoV-2 RNA is generally detectable in upper respiratory specimens during the acute phase of infection. The lowest concentration of SARS-CoV-2 viral copies this assay can detect is 138 copies/mL. A negative result does not preclude SARS-Cov-2 infection  and should not be used as the sole basis for treatment or other patient management decisions. A negative result may occur with  improper specimen collection/handling, submission of specimen other than nasopharyngeal swab, presence of viral mutation(s) within the areas targeted by this assay, and inadequate number of viral  copies(<138 copies/mL). A negative result must be combined with clinical observations, patient history, and epidemiological information. The expected result is Negative.  Fact Sheet for Patients:  EntrepreneurPulse.com.au  Fact Sheet for Healthcare Providers:  IncredibleEmployment.be  This test is no t yet approved or cleared by the [place] FDA and  has been authorized for detection and/or diagnosis of SARS-CoV-2 by FDA under an Emergency Use Authorization (EUA). This EUA will remain  in effect (meaning this test can be used) for the duration of the COVID-19 declaration under Section 564(b)(1) of the Act, 21 U.S.C.section 360bbb-3(b)(1), unless the authorization is terminated  or revoked sooner.       Influenza A by PCR NEGATIVE NEGATIVE Final   Influenza B by PCR NEGATIVE NEGATIVE Final    Comment: (NOTE) The Xpert Xpress SARS-CoV-2/FLU/RSV plus assay is intended as an aid in the diagnosis of influenza from Nasopharyngeal swab specimens and should not be used as a sole basis for treatment. Nasal washings and aspirates are unacceptable for Xpert Xpress SARS-CoV-2/FLU/RSV testing.  Fact Sheet for Patients: EntrepreneurPulse.com.au  Fact Sheet for Healthcare Providers: IncredibleEmployment.be  This test is not yet approved or cleared by the [place] FDA and has been authorized for detection and/or diagnosis of SARS-CoV-2 by FDA under an Emergency Use Authorization (EUA). This EUA will remain in effect (meaning this test can be used) for the duration of the COVID-19 declaration  under Section 564(b)(1) of the Act, 21 U.S.C. section 360bbb-3(b)(1), unless the authorization is terminated or revoked.  Performed at [facility], [place] [address], [place], [place] [address]   Gram stain     Status: None   Collection Time: 07/02/20  4:00 PM   Specimen: Lung, Right; Pleural Fluid  Result Value Ref Range Status   Specimen Description FLUID PLEURAL RIGHT  Final   Special Requests NONE  Final   Gram Stain   Final    FEW WBC PRESENT, PREDOMINANTLY MONONUCLEAR NO ORGANISMS SEEN Performed at [facility], [address] [place], [place] [address]    Report Status 07/03/2020 FINAL  Final  Acid Fast Smear (AFB)     Status: None   Collection Time: 07/02/20  4:00 PM   Specimen: Lung, Right; Pleural Fluid  Result Value Ref Range Status   AFB Specimen Processing Concentration  Final   Acid Fast Smear Negative  Final    Comment: (NOTE) Performed At: [facility] [place], [place] [id number] Rush Farmer MD OY:[id number]    Source (AFB) FLUID  Final    Comment: PLEURAL RIGHT Performed at [facility], [place] [address] [place], [place] [address]   Culture, body fluid w Gram Stain-bottle     Status: None (Preliminary result)   Collection Time: 07/02/20  4:00 PM   Specimen: Fluid  Result Value Ref Range Status   Specimen Description FLUID RIGHT PLEURAL  Final   Special Requests BOTTLES DRAWN AEROBIC AND ANAEROBIC  Final   Culture   Final    NO GROWTH 2 DAYS Performed at [facility], [place] [address] [place], [place] [address]    Report Status PENDING  Incomplete    Pertinent Lab seen by me: CBC Latest Ref Rng & Units 07/04/2020 07/03/2020 07/02/2020  WBC 4.0 - 10.5 K/uL 5.9 9.2 10.2  Hemoglobin 13.0 - 17.0 g/dL 8.1(L) 8.3(L) 9.1(L)  Hematocrit 39.0 - 52.0 % 26.7(L) 27.5(L) 31.0(L)  Platelets 150 - 400 K/uL 537(H) 620(H) 697(H)   CMP Latest  Ref Rng & Units 07/04/2020 07/03/2020 07/02/2020  Glucose 70 - 99 mg/dL 104(H) 93  121(H)  BUN 8 - 23 mg/dL _0 Creatinine 0.61 - 1.24 mg/dL 1.25(H) 1.48(H) 1.81(H)  Sodium 135 - 145 mmol/L 134(L) 134(L) 133(L)  Potassium 3.5 - 5.1 mmol/L 5.2(H) 5.1 4.9  Chloride 98 - 111 mmol/L 100 99 95(L)  CO2 22 - 32 mmol/L 21(L) 24 22  Calcium 8.9 - 10.3 mg/dL 9.3 9.2 9.2  Total Protein 6.5 - 8.1 g/dL - 7.4 8.5(H)  Total Bilirubin 0.3 - 1.2 mg/dL - 0.6 0.4  Alkaline Phos 38 - 126 U/L - 51 61  AST 15 - 41 U/L - 18 19  ALT 0 - 44 U/L - 15 19     Pertinent Imagings/Other Imagings Plain films and CT images have been personally visualized and interpreted; radiology reports have been reviewed. Decision making incorporated into the Impression / Recommendations.  Chest Xray 07/02/20 FINDINGS: Large right effusion and airspace disease are unchanged since the most recent exam. The left lung is clear. Heart size is normal. No pneumothorax. No acute or focal bony abnormality.  IMPRESSION: No change in a moderately large right pleural effusion and airspace disease.   US Renal 07/02/20 IMPRESSION: 1. Normal renal echotexture. 2. Clustered simple cysts lower pole left kidney as above. 3. Complex right pleural effusion with multiple septations.  I have spent 60 minutes for this patient encounter including review of prior medical records with greater than 50% of time being face to face and coordination of their care.  Electronically signed by:   Rosiland Oz, MD Infectious Disease Physician [facility] Infectious Disease Pager: [phone]

## 2020-07-04 NOTE — Progress Notes (Signed)
PHARMACY NOTE:  ANTIMICROBIAL RENAL DOSAGE ADJUSTMENT  Current antimicrobial regimen includes a mismatch between antimicrobial dosage and estimated renal function.  As per policy approved by the Pharmacy & Therapeutics and Medical Executive Committees, the antimicrobial dosage will be adjusted accordingly.  Current antimicrobial dosage:  Merrem 1 gm IV q12hr  Indication: PNA  Renal Function:  Estimated Creatinine Clearance: 67.9 mL/min (A) (by C-G formula based on SCr of 1.25 mg/dL (H)).    Antimicrobial dosage has been changed to:  Meropenem 1 gm IV q8hr  Thank you for allowing pharmacy to be a part of this patient's care.  Tera Mater, [facility] 07/04/2020 10:20 AM

## 2020-07-04 NOTE — Progress Notes (Signed)
Subjective:  No significant events overnight. He is not having too much discomfort from the chest tube. Discussed his positive cocaine finding on UDS. He insists that he only smokes marijuana, but does not use cocaine. Educated on the reason for the concern related to cocaine and how it could play a role in his effusion.  Objective:  Vital signs in last 24 hours: Vitals:   07/03/20 1530 07/03/20 1559 07/03/20 2123 07/04/20 0601  BP: 108/67 120/86 104/67 110/76  Pulse: 75 77 77 80  Resp: (!) _0 Temp:  97.7 F (36.5 C) 98 F (36.7 C) 98.4 F (36.9 C)  TempSrc:  Axillary    SpO2: 98% 95% 97% 99%  Weight:      Height:       Physical Exam: General: well appearing in NAD Cardiac: RRR Pulm: breathing comfortably on room air. Chest tube connected to wall suction with ~850cc in the container. Lung sounds heard throughout.   CBC Latest Ref Rng & Units 07/04/2020 07/03/2020 07/02/2020  WBC 4.0 - 10.5 K/uL 5.9 9.2 10.2  Hemoglobin 13.0 - 17.0 g/dL 8.1(L) 8.3(L) 9.1(L)  Hematocrit 39.0 - 52.0 % 26.7(L) 27.5(L) 31.0(L)  Platelets 150 - 400 K/uL 537(H) 620(H) 697(H)   CMP Latest Ref Rng & Units 07/04/2020 07/03/2020 07/02/2020  Glucose 70 - 99 mg/dL 104(H) 93 121(H)  BUN 8 - 23 mg/dL _1 Creatinine 0.61 - 1.24 mg/dL 1.25(H) 1.48(H) 1.81(H)  Sodium 135 - 145 mmol/L 134(L) 134(L) 133(L)  Potassium 3.5 - 5.1 mmol/L 5.2(H) 5.1 4.9  Chloride 98 - 111 mmol/L 100 99 95(L)  CO2 22 - 32 mmol/L 21(L) 24 22  Calcium 8.9 - 10.3 mg/dL 9.3 9.2 9.2  Total Protein 6.5 - 8.1 g/dL - 7.4 8.5(H)  Total Bilirubin 0.3 - 1.2 mg/dL - 0.6 0.4  Alkaline Phos 38 - 126 U/L - 51 61  AST 15 - 41 U/L - 18 19  ALT 0 - 44 U/L - 15 19   Assessment/Plan: [name] is 65yo person with hypertension, chronic alcohol use, recent admission for lobar pneumonia with early cavitation admitted 2/23 for right-sided pleural effusion, concerning for possible malignancy vs infection.  Active Problems:    Parapneumonic effusion   Anemia of chronic disease  #Right lower lobe necrotizing pneumonia. As noted on chest CT last admission. Rare GPC on BAL culture from 2/10. #Loculated, exudative pleural effusion (right). Likely attributable to the pneumonia found from last admission that he has been receiving treatment for. May consider alternative infectious sources as well. TB had been considered on the last admission however all acid fast testing including sputum, BAL, and pleural fluid. there has been no growth so far on pleural fluid culture which is reassuring. Could also be a secondary complication related to his cocaine use depending on how it was ingested however this is difficult to know as he denies its use. Malignancy could also be considered but unlikely. -s/p thoracentesis 2/23 with ~850cc removed -s/p pigtail pleural catheter placed. Approximately 750cc out at time of the procedure with around another 100cc out overnight  Plan  -appreciate pulmonology consult--planning for fibrinolytic therapy to assist with the loculations. Also appreciate their assistance with the chest tube. -will continue merrem for now.  -Appreciate infectious disease consult  -continue to follow pleural fluid culture  #Acute kidney injury. Continues to improve. Continue to monitor. #LUTS. PSA 0.75 which is reassuring however does not rule out prostate cancer or BPH. He continues  to have good urine output. Flomax started 2/24--will continue to monitor symptoms.  #Anemia of chronic disease stable #Gamma gap elevation. Suspect this is related to his acute illness.  -Evaluating for plasma dyscrasia with SPEP/light chains which are still in process.  DIET: Regular IVF: none DVT PPX: Lovenox CODE: FULL  Prior to Admission Living Arrangement: Home Anticipated Discharge Location: Home Barriers to Discharge: medical management Dispo: Anticipated discharge in approximately 1-3 day(s).   Mitzi Hansen, MD Internal  Medicine Resident PGY-2 [facility] Pager: [phone] 07/04/2020 12:43 PM    After 5pm on weekdays and 1pm on weekends: On Call pager [phone]

## 2020-07-04 NOTE — Progress Notes (Incomplete)
   Subjective: ***  Objective:  Vital signs in last 24 hours: Vitals:   07/03/20 1530 07/03/20 1559 07/03/20 2123 07/04/20 0601  BP: 108/67 120/86 104/67 110/76  Pulse: 75 77 77 80  Resp: (!) 27 19 20 17   Temp:  97.7 F (36.5 C) 98 F (36.7 C) 98.4 F (36.9 C)  TempSrc:  Axillary    SpO2: 98% 95% 97% 99%  Weight:      Height:       ***  Assessment/Plan:  Active Problems:   Parapneumonic effusion   Anemia of chronic disease  *** Prior to Admission Living Arrangement: Anticipated Discharge Location: Barriers to Discharge: Dispo: Anticipated discharge in approximately *** day(s).   , MD 07/04/2020, 6:28 AM Pager: [phone] After 5pm on weekdays and 1pm on weekends: On Call pager [phone]

## 2020-07-05 ENCOUNTER — Inpatient Hospital Stay (HOSPITAL_COMMUNITY): Payer: Medicaid Other

## 2020-07-05 DIAGNOSIS — Z9689 Presence of other specified functional implants: Secondary | ICD-10-CM

## 2020-07-05 DIAGNOSIS — J90 Pleural effusion, not elsewhere classified: Secondary | ICD-10-CM

## 2020-07-05 DIAGNOSIS — F1910 Other psychoactive substance abuse, uncomplicated: Secondary | ICD-10-CM

## 2020-07-05 LAB — CBC
HCT: 28.3 % — ABNORMAL LOW (ref 39.0–52.0)
Hemoglobin: 8.2 g/dL — ABNORMAL LOW (ref 13.0–17.0)
MCH: 28.7 pg (ref 26.0–34.0)
MCHC: 29 g/dL — ABNORMAL LOW (ref 30.0–36.0)
MCV: 99 fL (ref 80.0–100.0)
Platelets: 535 10*3/uL — ABNORMAL HIGH (ref 150–400)
RBC: 2.86 MIL/uL — ABNORMAL LOW (ref 4.22–5.81)
RDW: 15.8 % — ABNORMAL HIGH (ref 11.5–15.5)
WBC: 7 10*3/uL (ref 4.0–10.5)
nRBC: 0 % (ref 0.0–0.2)

## 2020-07-05 LAB — BASIC METABOLIC PANEL
Anion gap: 9 (ref 5–15)
BUN: 14 mg/dL (ref 8–23)
CO2: 23 mmol/L (ref 22–32)
Calcium: 8.9 mg/dL (ref 8.9–10.3)
Chloride: 101 mmol/L (ref 98–111)
Creatinine, Ser: 1.26 mg/dL — ABNORMAL HIGH (ref 0.61–1.24)
GFR, Estimated: >60 mL/min (ref >60)
Glucose, Bld: 106 mg/dL — ABNORMAL HIGH (ref 70–99)
Potassium: 5 mmol/L (ref 3.5–5.1)
Sodium: 133 mmol/L — ABNORMAL LOW (ref 135–145)

## 2020-07-05 LAB — PROTEIN ELECTROPHORESIS, SERUM
A/G Ratio: 0.5 — ABNORMAL LOW (ref 0.7–1.7)
Albumin ELP: 2.5 g/dL — ABNORMAL LOW (ref 2.9–4.4)
Alpha-1-Globulin: 0.4 g/dL (ref 0.0–0.4)
Alpha-2-Globulin: 1.5 g/dL — ABNORMAL HIGH (ref 0.4–1.0)
Beta Globulin: 1.1 g/dL (ref 0.7–1.3)
Gamma Globulin: 1.8 g/dL (ref 0.4–1.8)
Globulin, Total: 4.9 g/dL — ABNORMAL HIGH (ref 2.2–3.9)
Total Protein ELP: 7.4 g/dL (ref 6.0–8.5)

## 2020-07-05 MED ORDER — OXYCODONE-ACETAMINOPHEN 5-325 MG PO TABS: 1.0000 | ORAL_TABLET | Freq: Four times a day (QID) | ORAL | 0 refills | Status: AC | PRN

## 2020-07-05 MED ORDER — TAMSULOSIN HCL 0.4 MG PO CAPS
0.4000 mg | ORAL_CAPSULE | Freq: Every day | ORAL | 0 refills | Status: AC
Start: 2020-07-06 — End: ?

## 2020-07-05 NOTE — Discharge Instructions (Signed)
You were hospitalized for treatment of fluid around your right lung. You feel better because that fluid was drained with the chest tube.  Please follow up with your PCP next week and request a chest xray to make sure fluid is not reaccumulating around your lung. If it does, you will need to either go see the lung doctors (pulmonology) or return to the hospital, depending on how much it is and how you feel. Your doctor will help determine this.  Due to all of the fluid around your lung and the pneumonia that was diagnosed last admission, I would like for your PCP to obtain a chest CT in around 3 months from now, to make sure this is all cleared up.  If you develop shortness of breath or chest pain, please seek re-evaluation.  We have also started you on a new medication called Flomax this hospitalization. This is a pill that is used for men with difficulty urinating due to enlarged prostates. Please make sure to let your doctor know that this was a new medication.

## 2020-07-05 NOTE — Progress Notes (Signed)
Subjective/Interm history:  Received initial dose of DNAase tPA therapy for loculated effusion yesterday. No significant events overnight. No complaints on rounds today. Continues to inquire about discharging. Discussed that he is still undergoing therapy from pulm for the loculations.  Objective:  Vital signs in last 24 hours: Vitals:   07/03/20 2123 07/04/20 0601 07/04/20 1159 07/05/20 0608  BP: 104/67 110/76 99/66 112/73  Pulse: 77 80 62 65  Resp: _0 Temp: 98 F (36.7 C) 98.4 F (36.9 C) 98.2 F (36.8 C) 98.1 F (36.7 C)  TempSrc:      SpO2: 97% 99% 98% 100%  Weight:      Height:       Physical Exam: General: well appearing in NAD Cardiac: RRR Pulm: breathing comfortably on room air. Chest tube present. Improved aeration throughout lung fields but lungs sounds remain somewhat diminished at the right base  CBC Latest Ref Rng & Units 07/05/2020 07/04/2020 07/03/2020  WBC 4.0 - 10.5 K/uL 7.0 5.9 9.2  Hemoglobin 13.0 - 17.0 g/dL 8.2(L) 8.1(L) 8.3(L)  Hematocrit 39.0 - 52.0 % 28.3(L) 26.7(L) 27.5(L)  Platelets 150 - 400 K/uL 535(H) 537(H) 620(H)   CMP Latest Ref Rng & Units 07/05/2020 07/04/2020 07/03/2020  Glucose 70 - 99 mg/dL 106(H) 104(H) 93  BUN 8 - 23 mg/dL _1 Creatinine 0.61 - 1.24 mg/dL 1.26(H) 1.25(H) 1.48(H)  Sodium 135 - 145 mmol/L 133(L) 134(L) 134(L)  Potassium 3.5 - 5.1 mmol/L 5.0 5.2(H) 5.1  Chloride 98 - 111 mmol/L 101 100 99  CO2 22 - 32 mmol/L 23 21(L) 24  Calcium 8.9 - 10.3 mg/dL 8.9 9.3 9.2  Total Protein 6.5 - 8.1 g/dL - - 7.4  Total Bilirubin 0.3 - 1.2 mg/dL - - 0.6  Alkaline Phos 38 - 126 U/L - - 51  AST 15 - 41 U/L - - 18  ALT 0 - 44 U/L - - 15   Assessment/Plan: [name] is 65yo person with hypertension, chronic alcohol use, recent admission for lobar pneumonia with early cavitation admitted 2/23 for right-sided pleural effusion, concerning for possible malignancy vs infection.  Active Problems:   Parapneumonic  effusion   Anemia of chronic disease   Acute respiratory failure (HCC)   Chest tube in place   Loculated pleural effusion  #Right lower lobe necrotizing pneumonia. As noted on chest CT last admission. Rare GPC on BAL culture from 2/10. #Loculated, exudative pleural effusion (right). Still no growth on cultured pleural fluid. -s/p thoracentesis 2/23 with ~850cc removed -s/p pigtail pleural catheter placed. Minimal output overnight -ID consulted 2/25>>given no infective cause found on workup and normal procal, broad spectrum abx unlikely to be helpful Plan  -appreciate pulmonology consult--pt undergoing DN-ase/TPA therapy for loculation -appreciate ID consult--recommending stopping merem and going back to augmentin. Will need to consider alternative to augmentin due to diarhea noted by pt -will continue merrem for now.  -continue to follow pleural fluid culture  #Acute kidney injury. Stable from yesterday. Continue to monitor. #Mild hyponatremia. Continue to monitor. #LUTS. PSA 0.75 which is reassuring however does not rule out prostate cancer or BPH. He continues to have good urine output. Flomax started 2/24--will continue to monitor symptoms.  #Anemia of chronic disease stable #Thrombocytosis (mild) stable  #Gamma gap elevation. Suspect this is related to his acute illness.  -Evaluating for plasma dyscrasia with SPEP/light chains which are still in process.  #Substance use disorder. UDS from admission + for cocaine. Patient denies use. Counseled  on risks and recommended cessation.  DIET: Regular IVF: none DVT PPX: Lovenox CODE: FULL  Prior to Admission Living Arrangement: Home Anticipated Discharge Location: Home Barriers to Discharge: medical management Dispo: Anticipated discharge in approximately 1-3 day(s).   [name] Hansen, MD Internal Medicine Resident PGY-2 [name] Pager: [phone] 07/05/2020 8:17 AM    After 5pm on weekdays and  1pm on weekends: On Call pager [phone]

## 2020-07-05 NOTE — Discharge Summary (Signed)
Name: [name] [name] MRN: [id number] DOB: Jun 10, 1955 65 y.o. PCP: Lauretta Grill, NP  Date of Admission: 07/02/2020 11:22 AM Date of Discharge: 07/05/20 Attending Physician: Lenice Pressman, MD  Discharge Diagnosis: 1. Acute hypoxic respiratory failure (resolved) 2. Complex loculated right pleural effusion 3. Right lower lobe necrotizing pneumonia 4. Acute kidney injury 5. Mild Hyponatremia 6. Lower urinary tract symptoms  7. Anemia of Chronic Disease 8. Mild Thrombocytosis 9. Elevated gamma gap 10. Substance use disorder 11. Left simple renal cysts  Discharge Medications: Allergies as of 07/05/2020      Reactions   Hctz [hydrochlorothiazide] Other (See Comments)   Erectile dysfunction      Medication List    TAKE these medications   amoxicillin-clavulanate 875-125 MG tablet Commonly known as: AUGMENTIN Take 1 tablet by mouth 2 (two) times daily for 21 days.   aspirin 81 MG EC tablet Take 1 tablet (81 mg total) by mouth daily. Swallow whole.   folic acid 1 MG tablet Commonly known as: FOLVITE Take 1 tablet (1 mg total) by mouth daily.   lisinopril 40 MG tablet Commonly known as: ZESTRIL Take 40 mg by mouth daily.   oxyCODONE-acetaminophen 5-325 MG tablet Commonly known as: PERCOCET/ROXICET Take 1 tablet by mouth every 6 (six) hours as needed for up to 3 days for moderate pain or severe pain.   tamsulosin 0.4 MG Caps capsule Commonly known as: FLOMAX Take 1 capsule (0.4 mg total) by mouth daily. Start taking on: July 06, 2020   thiamine 100 MG tablet Take 1 tablet (100 mg total) by mouth daily.       Disposition and follow-up:   [name] [name] was discharged from [facility] in Stable condition.  At the hospital follow up visit please address:  Complex loculated right exudative pleural effusion secondary to right lower lobe necrotizing pneumonia.  Discharge medications: Continue augmentin (end date 07/12/20) Follow up  recommendations -CXR to evaluate pleural effusion. Depending on the degree of re-accumulation, may need pulmonology referral or hospital readmission -Chest CT with contrast in 2-3 months to rule out underlying malignancy  Acute kidney injury. Improved over the hospitalization. Creatinine 1.26 on discharge. Follow up recommendations -BMP at time of follow up  Lower urinary tract symptoms. PSA 0.75 this admission. Discharge Medications: Flomax 0.36m daily Follow up recommendations -continue to assess symptoms -consider urology referral  Elevated Gamma Gap. Most likely due to this ongoing infection however obtained SPEP/light chains this admission. Follow up recommendations -follow up above labs -continue to monitor  Monocular vision loss, chronic central retinal vein occlusion. No change from last admission Follow up recommendation -please ensure he has an ophthalmology appointment for ongoing evaluation and monitoring.  Substance use disorder. He admits to marijuana use. UDS was + for cocaine this admission however pt denies use so it remains unclear how it was ingested. Educated regarding risks. Follow up recommendations -continue to monitor for signs of abuse  Labs / imaging needed at time of follow-up: CBC, BMP, CXR  Pending labs/ test needing follow-up: pleural fluid acid fast culture, pleural fluid fungal culture, serum flow cytometry, immunofixation, kappa/lamda light chains, CD 19, CD20, acid fast BAL and sputum cultures  Follow-up Appointments:  Follow-up Information    HLauretta Grill NP. Schedule an appointment as soon as possible for a visit.   Specialty: Nurse Practitioner Why: 3-5 days Contact information: [phone]               Hospital Course by  problem list: 65 year old male recently admitted to [facility] 2/5-2/13/22 for treatment of a right lower lobe necrotizing pneumonia with cavitary features. He  underwent a thorough investigation for TB and other infectious etiologies that admission which were negative. He had also noted left monoclear vision loss on admission that had been present for 3-4 months. He underwent evaluation of that during last admission as well. See 06/22/20 discharge summary for details.  Acute hypoxic respiratory failure secondary to complex loculated right pleural effusion. Necrotizing RLL pneumonia with cavitary features. He was evaluated by pulmonology on 2/21 as recommended last admission. A chest xray was obtained at that visit and he was found to have a large right pleural effusion and was instructed to be evaluated in the ED.  He noted that he had felt well for the first few days and had been adhering to the Augmentin for necrotizing pneumonia after discharge however became progressively short of breath.  He underwent a thoracentesis on day of admission with 850cc removed. Fluid studies were consistent with a exudative effusion. Fluid cultures had no growth at time of discharge however remained in process. Due to the loculations noted with US imaging, pulmonology was consulted and he underwent a pigtail pleural catheter placement on hospital day #1. He had ~750cc of pleural fluid removed during the initial placement however output decreased considerably thereafter. He underwent one treatment of fibrinolytic therapy via chest tube for the loculations on hospital day #3. Pulmonology re-evaluated the effusion via bedside ultrasound on hospital day #4 and noted that there was only a trace pleural effusion and, due to minimal output since placement, the chest tube was removed. He was discharged with instructions to follow up with his PCP and obtain a CXR at time of follow up. IMPRESSION  I suspect that this loculated effusion was secondary to the necrotizing pneumonia. As stated above, there was no growth on the fluid cultures and he did not show signs of systemic infection this  hospitalization.  On admission, he had mentioned that he had been compliant with the augmentin however was having diarrhea. I discussed this with him prior to discharge and he noted that the diarrhea was not bad and he was ammenable to continuing this to complete the treatment course.  I do think that he needs a chest CT in a few months to make sure there is no underlying malignancy.   Acute kidney injury.  He presented, this admission, with an AKI. His creatinine had been 0.9 on discharge on 2/13. His creatinine was 1.8 this admission. I suspect that it was pre-renal and it improved over the course of his hospitalization however did not resolve. Discharge creatinine was 1.26.  Lower urinary tract symptoms He had noted LUTS on admission, including weak stream and incomplete bladder emptying. His UA was completely unremarkable this admission. A PSA was obtained and was normal at 0.75. Post void bladder scans were unremarkable and renal ultrasound did not show hydronephrosis however did note a cluster of simple cysts on the left kidney. We started a trial with Flomax this admission, which he said worked very well. He was instructed on following up with his PCP for ongoing monitoring.  Anemia of Chronic Disease, Mild thrombocytosis, elevated gamma gap (5.9) Hemoglobin was stable this admission and unchanged from last admission. In the setting of an elevated gamma gap, SPEP, IPF, kappa/lamda light chains were obtained and remained in process at time of discharge.  Substance use disorder (marijuana, cocaine) He openly admits  to marijuana use. A UDS was obtained this admission and was + for cocaine. On discussion of this finding with him, he denied cocaine use. I reviewed his medications, none of which should cause a false positive.  Discharge Exam:   BP 111/70 (BP Location: Left Arm)   Pulse 70   Temp 98.6 F (37 C) (Oral)   Resp 16   Ht _0  (1.854 m)   Wt 78.3 kg   SpO2 98%   BMI 22.77 kg/m     Pertinent Labs, Studies, and Procedures:  CBC Latest Ref Rng & Units 07/05/2020 07/04/2020 07/03/2020  WBC 4.0 - 10.5 K/uL 7.0 5.9 9.2  Hemoglobin 13.0 - 17.0 g/dL 8.2(L) 8.1(L) 8.3(L)  Hematocrit 39.0 - 52.0 % 28.3(L) 26.7(L) 27.5(L)  Platelets 150 - 400 K/uL 535(H) 537(H) 620(H)   BMP Latest Ref Rng & Units 07/05/2020 07/04/2020 07/03/2020  Glucose 70 - 99 mg/dL 106(H) 104(H) 93  BUN 8 - 23 mg/dL _1 Creatinine 0.61 - 1.24 mg/dL 1.26(H) 1.25(H) 1.48(H)  Sodium 135 - 145 mmol/L 133(L) 134(L) 134(L)  Potassium 3.5 - 5.1 mmol/L 5.0 5.2(H) 5.1  Chloride 98 - 111 mmol/L 101 100 99  CO2 22 - 32 mmol/L 23 21(L) 24  Calcium 8.9 - 10.3 mg/dL 8.9 9.3 9.2   DG Chest 1 View Result Date: 07/02/2020 CLINICAL DATA:  Status post right pleural effusion EXAM: CHEST  1 VIEW COMPARISON:  07/02/2020 at 11:43 a.m. FINDINGS: Single frontal view of the chest demonstrates interval decrease in right pleural effusion after thoracentesis. Small residual pleural effusion at the right costophrenic angle, with continued right basilar consolidation. No evidence of pneumothorax. Left chest is clear. IMPRESSION: 1. Decreased right pleural effusion after thoracentesis. No evidence of pneumothorax. Electronically Signed   By: Randa Ngo M.D.   On: 07/02/2020 16:21   US RENAL Result Date: 07/02/2020 CLINICAL DATA:  Urinary retention EXAM: RENAL / URINARY TRACT ULTRASOUND COMPLETE COMPARISON:  06/15/2020, 11/06/2013 FINDINGS: Right Kidney: Renal measurements: 12.3 x 4.5 x 5.8 cm = volume: 165.3 mL. Echogenicity within normal limits. No mass or hydronephrosis visualized. Left Kidney: Renal measurements: 9.5 x 5.0 x 4.7 cm = volume: 115.2 mL. Echogenicity within normal limits. Clustered cysts within the lower pole left kidney measured 2.8 x 1.9 x 2.7 cm. The cysts individually appear simple in nature. A unilocular cyst with seen in this location on a prior abdominal CT 12/01/2009. Bladder: Appears normal for degree of  bladder distention. Other: Incidental note is made of a complex right pleural effusion with multiple septations. IMPRESSION: 1. Normal renal echotexture. 2. Clustered simple cysts lower pole left kidney as above. 3. Complex right pleural effusion with multiple septations. Electronically Signed   By: Randa Ngo M.D.   On: 07/02/2020 15:53   DG Chest Port 1 View Result Date: 07/05/2020 IMPRESSION: No significant pneumothorax following chest tube removal. Electronically Signed   By: Valentino Saxon MD   On: 07/05/2020 18:33   DG CHEST PORT 1 VIEW Result Date: 07/05/2020 IMPRESSION: Stable right base opacification likely small residual effusion with associated basilar atelectasis. Right base pigtail pleural drainage catheter unchanged. Electronically Signed   By: Marin Olp M.D.   On: 07/05/2020 08:31   DG CHEST PORT 1 VIEW Result Date: 07/04/2020 IMPRESSION: Stable pigtail chest catheter noted at right base. No pneumothorax. Small right pleural effusion with right lower lung region atelectatic change, stable. Left lung clear. Stable cardiac silhouette. Electronically Signed   By: Lowella Grip III  M.D.   On: 07/04/2020 09:47   DG CHEST PORT 1 VIEW Result Date: 07/03/2020 IMPRESSION: 1. Decreased right pleural effusion and right basilar consolidation after right pigtail drainage catheter placement. Electronically Signed   By: Randa Ngo M.D.   On: 07/03/2020 15:46      Discharge Instructions: Discharge Instructions    Call MD for:  difficulty breathing, headache or visual disturbances   Complete by: As directed    Call MD for:  extreme fatigue   Complete by: As directed    Call MD for:  persistant dizziness or light-headedness   Complete by: As directed    Call MD for:  persistant nausea and vomiting   Complete by: As directed    Call MD for:  redness, tenderness, or signs of infection (pain, swelling, redness, odor or green/yellow discharge around incision site)   Complete  by: As directed    Call MD for:  severe uncontrolled pain   Complete by: As directed    Call MD for:  temperature >100.4   Complete by: As directed    Discharge instructions   Complete by: As directed    Follow up with your PCP in 3-5 days. Please request a chest xray at the time of follow up to evaluate for recurrent pleural effusion. Please also request a lab to check your kidney function at the time follow up.   No wound care   Complete by: As directed       Signed: Mitzi Hansen, MD Internal Medicine Resident PGY-2 [facility] Pager: [phone] 07/05/2020 5:36 PM

## 2020-07-05 NOTE — Progress Notes (Signed)
   NAME:  [name] [name], MRN:  [id number], DOB:  February 23, 1956, LOS: 3 ADMISSION DATE:  07/02/2020, CONSULTATION DATE:  07/03/2020 REFERRING MD:  Dr. Antony Contras, CHIEF COMPLAINT:  Loculated pleural effusion    Brief History:  65 year old male readmitted with dyspnea on exertion and nonproductive cough. Of note patient recently admitted to this facility from 2/5-2/13 for lobar pneumonia with early cavitary presentation with concern for TB and optic neuritis.  Discharged on p.o. Augmentin for 4 weeks.  Then developed loculated effusion on the right lower lobe and presented for admission.  management  Past Medical History:  Arthritis Gout Hypertension Tobacco use Alcohol use  Significant Hospital Events:  2/23 Admit, thoracentesis by IR 2/24 Chest tube placement by PCCM 2/25 First dose DNase, TPA right chest tube.  Consults:  Interventional radiology, PCCM  Procedures:  IR guided thoracentesis 2/23 > 850 mL of amber-colored fluid removed Right chest tube 2/24  Significant Diagnostic Tests:   Chest x-ray 2/23 > no change in moderate large right pleural effusion with airspace disease   Renal ultrasound 2/23 > clustered small cyst lower pole left kidney and complex right pleural effusion with multiple septations  Micro Data:  COVID 2/23 > negative Pleural fluid culture 2/23 >  MRSA PCR 2/3 >  AFB 2/23 > Gram stain pleural fluid 2/23 > few WBC present predominantly mononuclear no organisms seen  Antimicrobials:  Meropenem 2/23 >   Interim History / Subjective:   In last two days has had minimal chest tube output, < 100cc (charting is not accurate)  Objective   Blood pressure 111/70, pulse 70, temperature 98.6 F (37 C), temperature source Oral, resp. rate 16, height 6\' 1"  (1.854 m), weight 78.3 kg, SpO2 98 %.        Intake/Output Summary (Last 24 hours) at 07/05/2020 1438 Last data filed at 07/05/2020 0700 Gross per 24 hour  Intake 660 ml  Output 2100 ml  Net -1440 ml    Filed Weights   07/02/20 1206 07/03/20 0533  Weight: 79.3 kg 78.3 kg    Examination:  General:  Resting comfortably in bed HENT: NCAT OP clear PULM: diminished left base B, normal effort CV: RRR, no mgr GI: BS+, soft, nontender MSK: normal bulk and tone Neuro: awake, alert, no distress, MAEW  Resolved Hospital Problem list     Assessment & Plan:  Right lobar pneumonia with early cavitary component -Discharged on p.o. Augmentin for 4 weeks. -Acid-fast culture from 2/10 remains pending Moderate to large empyema significantly  -Underwent thoracentesis per IR 2/23 with an IVP mL fluid removed, chest tube placed on 2/24 -TPA/DNase given on 2/25 -minimal chest tube output overnight 2/26 -bedside ultrasound on 2/26> sliding lung, atelectactasis noted, only trace pleural effusion noted P: Antibiotics per primary Will order chest tube removal as output has been minimal over last two days and bedside ultrasound today is favorable Will need CXR after chest tube removal and again mid week next week to ensure fluid is not re-accumulating  OK to d/c home from our perspective, should receive at least another 2 weeks antibiotics  Best practice (evaluated daily)  Per primary  Signature:    3/26, MD [facility] Pager: [phone] Cell: [phone]   If no response to pager , please call [phone] until 7pm After 7:00 pm call Elink  [phone] 07/05/2020, 2:38 PM

## 2020-07-06 LAB — CD19 AND CD20, FLOW CYTOMETRY

## 2020-07-07 LAB — CULTURE, BODY FLUID W GRAM STAIN -BOTTLE: Culture: NO GROWTH

## 2020-07-07 LAB — KAPPA/LAMBDA LIGHT CHAINS
Kappa free light chain: 117.1 mg/L — ABNORMAL HIGH (ref 3.3–19.4)
Kappa, lambda light chain ratio: 2.58 — ABNORMAL HIGH (ref 0.26–1.65)
Lambda free light chains: 45.4 mg/L — ABNORMAL HIGH (ref 5.7–26.3)

## 2020-07-08 LAB — IMMUNOFIXATION ELECTROPHORESIS
IgA: 509 mg/dL — ABNORMAL HIGH (ref 61–437)
IgG (Immunoglobin G), Serum: 1836 mg/dL — ABNORMAL HIGH (ref 603–1613)
IgM (Immunoglobulin M), Srm: 50 mg/dL (ref 20–172)
Total Protein ELP: 7.6 g/dL (ref 6.0–8.5)

## 2020-07-14 LAB — FISH HES LEUKEMIA, 4Q12 REA

## 2020-07-16 ENCOUNTER — Inpatient Hospital Stay: Payer: Medicaid Other | Admitting: Internal Medicine

## 2020-07-23 ENCOUNTER — Inpatient Hospital Stay: Payer: Medicaid Other | Admitting: Internal Medicine

## 2020-07-26 LAB — CULTURE, FUNGUS WITHOUT SMEAR

## 2020-07-30 LAB — ACID FAST CULTURE WITH REFLEXED SENSITIVITIES (MYCOBACTERIA): Acid Fast Culture: NEGATIVE

## 2020-07-30 LAB — FUNGUS CULTURE WITH STAIN

## 2020-07-30 LAB — FUNGAL ORGANISM REFLEX

## 2020-07-30 LAB — FUNGUS CULTURE RESULT

## 2020-07-31 LAB — ACID FAST CULTURE WITH REFLEXED SENSITIVITIES (MYCOBACTERIA): Acid Fast Culture: NEGATIVE

## 2020-08-04 LAB — ACID FAST CULTURE WITH REFLEXED SENSITIVITIES (MYCOBACTERIA): Acid Fast Culture: NEGATIVE

## 2020-08-15 ENCOUNTER — Ambulatory Visit: Payer: Medicaid Other | Admitting: Pulmonary Disease

## 2020-08-15 ENCOUNTER — Other Ambulatory Visit: Payer: Self-pay | Admitting: Pulmonary Disease

## 2020-08-15 DIAGNOSIS — J851 Abscess of lung with pneumonia: Secondary | ICD-10-CM

## 2020-08-15 DIAGNOSIS — J850 Gangrene and necrosis of lung: Secondary | ICD-10-CM

## 2020-08-15 LAB — ACID FAST CULTURE WITH REFLEXED SENSITIVITIES (MYCOBACTERIA): Acid Fast Culture: NEGATIVE

## 2020-09-18 ENCOUNTER — Telehealth: Payer: Self-pay | Admitting: Pulmonary Disease

## 2020-09-18 NOTE — Telephone Encounter (Signed)
Called and spoke with [name] [name] TB nurse at the [facility] department for Dr [name] [name] who stated they need CBC with diff and platelets, hepatic function labs and chest ray done when he comes to appointment with Dr [name] [name] on Monday 09/22/20 and the results faxed to them at [phone] which is the [facility] Department.  Called and spoke with patient who stated it was ok to send lab and xray results to Dr [name] [name] & [name] [name]. Patient expressed understanding of what I was asking and stated he was fine with them being faxed when we get results.  Dr [name] [name] is this ok to order and to be done at his appointment with you Monday 09/22/2020?

## 2020-09-22 ENCOUNTER — Ambulatory Visit: Payer: Medicaid Other | Admitting: Pulmonary Disease

## 2020-09-22 NOTE — Telephone Encounter (Signed)
Dr. Vassie Loll has been out of the office. Pt's appt had to be rescheduled with Dr. Vassie Loll for 6/2.  Attempted to call Lara Mulch with the Health Dept but unable to reach. Left message for her to return call so we can discuss the above with her.

## 2020-09-23 NOTE — Telephone Encounter (Signed)
Attempted to call pt to see if he can come to see Dr. Vassie Loll either today 5/17 at 3:45 or tomorrow 5/18 at 3:45 but unable to reach pt. Left message for him to return call to see if he is able to come in and if so, we would reschedule his appt from 10/09/20 with Dr. Vassie Loll to then.   Also attempted to call Lara Mulch with the health dept but unable to reach her. Left message for her to return call too.

## 2020-09-23 NOTE — Telephone Encounter (Signed)
These labs can be obtained on his appointment 6/2 if still needed

## 2020-09-23 NOTE — Telephone Encounter (Signed)
Lm for [employer].

## 2020-09-23 NOTE — Telephone Encounter (Signed)
Spoke to [place] with health department.  Tammy is requesting for labs and CXR to be obtained prior to 10/09/2020.  Dr. Vassie Loll, please advise if okay to order CXR? Thanks

## 2020-09-23 NOTE — Telephone Encounter (Signed)
I can see him at 3:45 PM either today or tomorrow and obtain blood work and chest x-ray. Please find out why he is being followed by the health department -need their last note

## 2020-09-25 NOTE — Telephone Encounter (Signed)
Lmtcb for [name] [name]. 

## 2020-09-26 NOTE — Telephone Encounter (Signed)
LMTCB for [name] [name] 

## 2020-09-30 NOTE — Telephone Encounter (Signed)
ATC pt, unable to leave VM. WCB. Pt scheduled to see Dr. Vassie Loll on 6/2.

## 2020-10-01 ENCOUNTER — Encounter: Payer: Self-pay | Admitting: Emergency Medicine

## 2020-10-01 NOTE — Telephone Encounter (Signed)
Letter printed and placed in the outgoing mail.

## 2020-10-01 NOTE — Telephone Encounter (Signed)
Called and spoke to Tammy with the health Dept. She states they were evaluating the pt for active TB. The pt was AFB smear neg x 3, cultures were neg for mycobacterium but the quantiferon gold resulted positive. The provider at the health dept is requesting a CXR, CBC with diff and LFT to treat his latent TB. We have not been able to get in touch with the pt via phone. Will send letter to pt's address reminding him of appt and the importance to keep the appt.   Will forward to Dr. Vassie Loll as Lorain Childes.  Will forward to triage to print letter (already created) and mail to pt.

## 2020-10-09 ENCOUNTER — Ambulatory Visit: Payer: Medicaid Other

## 2020-10-09 ENCOUNTER — Ambulatory Visit: Payer: Medicaid Other | Admitting: Pulmonary Disease

## 2021-04-18 ENCOUNTER — Other Ambulatory Visit: Payer: Self-pay

## 2021-04-18 ENCOUNTER — Encounter (HOSPITAL_COMMUNITY): Payer: Self-pay

## 2021-04-18 ENCOUNTER — Ambulatory Visit (HOSPITAL_COMMUNITY)
Admission: EM | Admit: 2021-04-18 | Discharge: 2021-04-18 | Disposition: A | Discharge status: Home / Self Care | Payer: Medicaid Other | Attending: Emergency Medicine | Admitting: Emergency Medicine

## 2021-04-18 DIAGNOSIS — M25531 Pain in right wrist: Secondary | ICD-10-CM | POA: Diagnosis not present

## 2021-04-18 MED ORDER — COLCHICINE 0.6 MG PO TABS: ORAL_TABLET | ORAL | 0 refills | Status: DC

## 2021-04-18 MED ORDER — KETOROLAC TROMETHAMINE 30 MG/ML IJ SOLN
INTRAMUSCULAR | Status: AC
  Filled 2021-04-18: qty 1

## 2021-04-18 MED ORDER — KETOROLAC TROMETHAMINE 30 MG/ML IJ SOLN
30.0000 mg | Freq: Once | INTRAMUSCULAR | Status: AC
  Administered 2021-04-18: 30 mg via INTRAMUSCULAR

## 2021-04-18 NOTE — Discharge Instructions (Addendum)
Please wear wrist brace during the day. Take Colchicine for wrist pain.  Take 2 tablets on the first day.  Then take 1 tablet daily on days 2 through 5.

## 2021-04-18 NOTE — ED Triage Notes (Signed)
Pt presents to the office for right wrist pain x 3 days.

## 2021-04-18 NOTE — ED Provider Notes (Signed)
[facility]  ____________________________________________  Time seen: Approximately 10:44 AM  I have reviewed the triage vital signs and the nursing notes.   HISTORY  Chief Complaint RIGHT WRIST PAIN   Historian Patient     HPI [name] [name] is a 65 y.o. male with a history of gout and frequent gout flares of the right wrist and 1 presents to the urgent care with acute right wrist pain.  No fever or chills at home.  No prior history of septic joints.  Patient states that immobilization usually helps his pain.  He has been taking ibuprofen at home.   Past Medical History:  Diagnosis Date   Arthritis    right hip   Disc displacement, lumbar    Gout    Hypertension    Takes medications daily     Immunizations up to date:  Yes.     Past Medical History:  Diagnosis Date   Arthritis    right hip   Disc displacement, lumbar    Gout    Hypertension    Takes medications daily    Patient Active Problem List   Diagnosis Date Noted   Acute respiratory failure (HCC)    Chest tube in place    Loculated pleural effusion    Anemia of chronic disease    Parapneumonic effusion 07/02/2020   Acute kidney injury (HCC)    Abscess of lung with pneumonia (HCC)    Monocular vision loss    Cavitary pneumonia    Optic neuritis    Necrotizing pneumonia (HCC) 06/15/2020   Alcohol dependence (HCC) 05/20/2014   Hypertension 04/30/2011   Spinal stenosis, lumbar region, with neurogenic claudication 02/01/2011   Hepatitis C virus infection without hepatic coma 02/01/2011   Chronic pain 02/01/2011   HIP PAIN, RIGHT 02/02/2010    Past Surgical History:  Procedure Laterality Date   BACK SURGERY  1980s   [id number]:2013one back surgery   BRONCHIAL BIOPSY  06/19/2020   Procedure: BRONCHIAL BIOPSIES;  Surgeon: Oretha Milch, MD;  Location: [facility] ENDOSCOPY;  Service: Cardiopulmonary;;   BRONCHIAL BRUSHINGS  06/19/2020   Procedure: BRONCHIAL BRUSHINGS;  Surgeon: Oretha Milch, MD;   Location: [facility] ENDOSCOPY;  Service: Cardiopulmonary;;   BRONCHIAL WASHINGS  06/19/2020   Procedure: BRONCHIAL WASHINGS;  Surgeon: Oretha Milch, MD;  Location: [facility] ENDOSCOPY;  Service: Cardiopulmonary;;   CHEST TUBE INSERTION N/A 07/03/2020   Procedure: CHEST TUBE INSERTION;  Surgeon: Chilton Greathouse, MD;  Location: [facility] ENDOSCOPY;  Service: Cardiopulmonary;  Laterality: N/A;   COLONOSCOPY WITH PROPOFOL N/A 09/18/2013   Procedure: COLONOSCOPY WITH PROPOFOL;  Surgeon: Charolett Bumpers, MD;  Location: [facility] ENDOSCOPY;  Service: Endoscopy;  Laterality: N/A;   IR THORACENTESIS ASP PLEURAL SPACE W/IMG GUIDE  07/02/2020   VIDEO BRONCHOSCOPY N/A 06/19/2020   Procedure: VIDEO BRONCHOSCOPY WITH FLUORO;  Surgeon: Oretha Milch, MD;  Location: [facility] ENDOSCOPY;  Service: Cardiopulmonary;  Laterality: N/A;    Prior to Admission medications   Medication Sig Start Date End Date Taking? Authorizing Provider  colchicine 0.6 MG tablet On the first day, take 2 tablets by mouth. Wait one hour and take one more tablet. On days 2-5, take one tablet daily. 04/18/21  Yes Pia Mau M, PA-C  aspirin EC 81 MG EC tablet Take 1 tablet (81 mg total) by mouth daily. Swallow whole. 06/22/20   Evlyn Kanner, MD  folic acid (FOLVITE) 1 MG tablet Take 1 tablet (1 mg total) by mouth daily. 06/22/20   Evlyn Kanner, MD  lisinopril (ZESTRIL) 40 MG tablet Take 40 mg by mouth daily.    [provider]  tamsulosin (FLOMAX) 0.4 MG CAPS capsule Take 1 capsule (0.4 mg total) by mouth daily. 07/06/20   Elige Radon, MD  thiamine 100 MG tablet Take 1 tablet (100 mg total) by mouth daily. 06/22/20   Evlyn Kanner, MD  hydrochlorothiazide (HYDRODIURIL) 25 MG tablet Take 1 tablet (25 mg total) by mouth daily. 05/17/11 07/26/11  Nestor Ramp, MD    Allergies Hctz [hydrochlorothiazide]  Family History  Problem Relation Age of Onset   Hypertension Mother     Social History Social History   Tobacco Use   Smoking status: Every  Day    Packs/day: 0.30    Years: 35.00    Pack years: 10.50    Types: Cigarettes   Smokeless tobacco: Never  Substance Use Topics   Alcohol use: Yes    Alcohol/week: 0.0 standard drinks    Comment: occasionally   Drug use: Yes    Types: Marijuana, Cocaine    Comment: last used 3-4 days ago     Review of Systems  Constitutional: No fever/chills Eyes:  No discharge ENT: No upper respiratory complaints. Respiratory: no cough. No SOB/ use of accessory muscles to breath Gastrointestinal:   No nausea, no vomiting.  No diarrhea.  No constipation. Musculoskeletal: Patient has right wrist pain.  Skin: Negative for rash, abrasions, lacerations, ecchymosis.    ____________________________________________   PHYSICAL EXAM:  VITAL SIGNS: ED Triage Vitals  Enc Vitals Group     BP 04/18/21 1018 (!) 180/94     Pulse Rate 04/18/21 1018 65     Resp 04/18/21 1018 16     Temp 04/18/21 1018 98.6 F (37 C)     Temp Source 04/18/21 1018 Oral     SpO2 04/18/21 1018 100 %     Weight --      Height --      Head Circumference --      Peak Flow --      Pain Score 04/18/21 1019 10     Pain Loc --      Pain Edu? --      Excl. in GC? --      Constitutional: Alert and oriented. Well appearing and in no acute distress. Eyes: Conjunctivae are normal. PERRL. EOMI. Head: Atraumatic. ENT:      Nose: No congestion/rhinnorhea.      Mouth/Throat: Mucous membranes are moist.  Neck: No stridor.  No cervical spine tenderness to palpation. Cardiovascular: Normal rate, regular rhythm. Normal S1 and S2.  Good peripheral circulation. Respiratory: Normal respiratory effort without tachypnea or retractions. Lungs CTAB. Good air entry to the bases with no decreased or absent breath sounds Gastrointestinal: Bowel sounds x 4 quadrants. Soft and nontender to palpation. No guarding or rigidity. No distention. Musculoskeletal: Patient performs limited range of motion at the right wrist.  Right wrist does  appear edematous.  Palpable radial pulse.  Capillary refill less than 2 seconds on the right. Neurologic:  Normal for age. No gross focal neurologic deficits are appreciated.  Skin:  Skin is warm, dry and intact. No rash noted. Psychiatric: Mood and affect are normal for age. Speech and behavior are normal.   ____________________________________________   LABS (all labs ordered are listed, but only abnormal results are displayed)  Labs Reviewed - No data to display ____________________________________________  EKG   ____________________________________________  RADIOLOGY   No results found.  ____________________________________________    PROCEDURES  Procedure(s) performed:     Procedures     Medications  ketorolac (TORADOL) 30 MG/ML injection 30 mg (has no administration in time range)     ____________________________________________   INITIAL IMPRESSION / ASSESSMENT AND PLAN / ED COURSE  Pertinent labs & imaging results that were available during my care of the patient were reviewed by me and considered in my medical decision making (see chart for details).    Assessment and plan Gout 65 year old male presents to the emergency department with acute right wrist pain.  On physical exam, patient has edema of the right wrist with no overlying erythema.  Patient was given an injection of Toradol and started on colchicine.  Return precautions were given to return with new or worsening symptoms.      ____________________________________________  FINAL CLINICAL IMPRESSION(S) / ED DIAGNOSES  Final diagnoses:  Right wrist pain      NEW MEDICATIONS STARTED DURING THIS VISIT:  ED Discharge Orders          Ordered    colchicine 0.6 MG tablet        04/18/21 1040                This chart was dictated using voice recognition software/Dragon. Despite best efforts to proofread, errors can occur which can change the meaning. Any change was  purely unintentional.     Orvil Feil, PA-C 04/18/21 1047

## 2021-08-18 ENCOUNTER — Encounter: Payer: Self-pay | Admitting: Neurology

## 2022-02-01 ENCOUNTER — Ambulatory Visit: Payer: Medicaid Other | Admitting: Neurology

## 2022-02-01 ENCOUNTER — Encounter: Payer: Self-pay | Admitting: Neurology

## 2022-02-01 DIAGNOSIS — Z029 Encounter for administrative examinations, unspecified: Secondary | ICD-10-CM

## 2022-03-27 IMAGING — DX DG CHEST 1V PORT
1 series · 1 of 1 positions shown · non-contrast
Comparison: October 27, 2015

CLINICAL DATA: Fever

EXAM:
PORTABLE CHEST 1 VIEW

[chest ap]
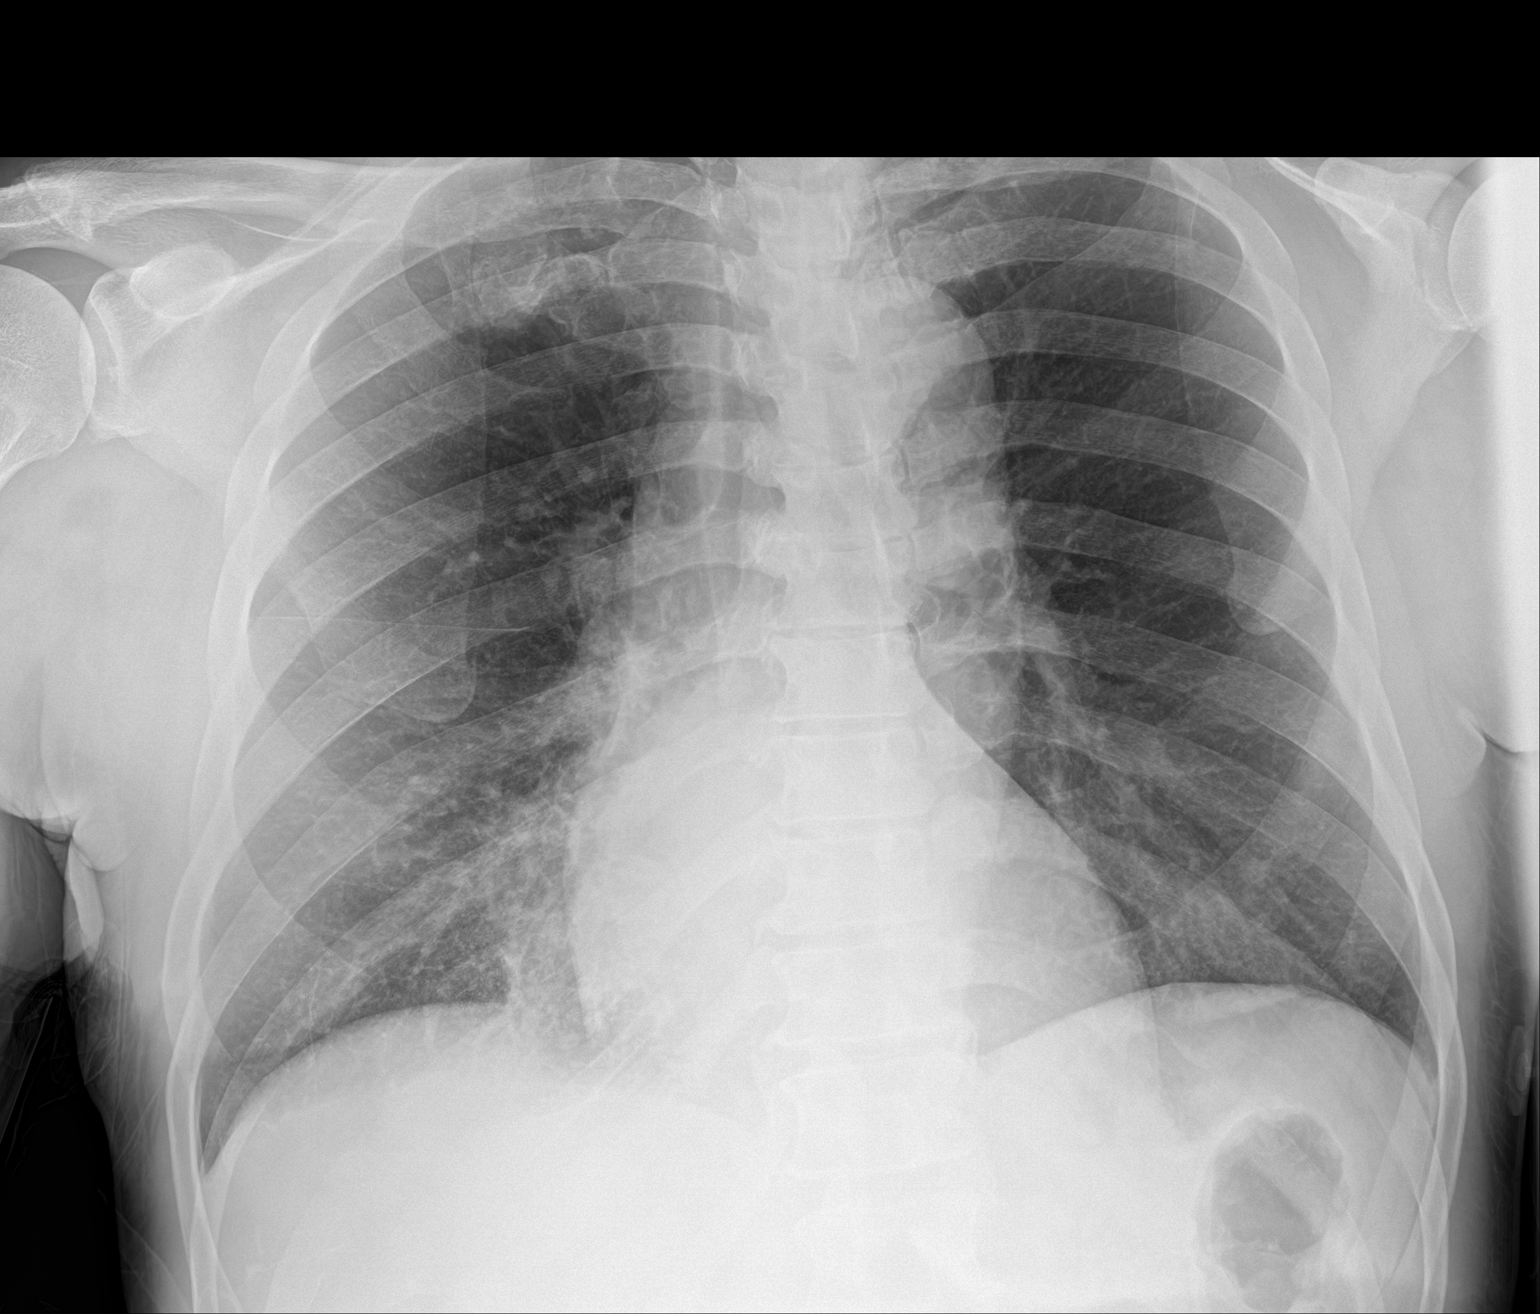

[1 of 1 positions shown; findings below may reference images not displayed]

FINDINGS: The cardiomediastinal silhouette is unchanged in contour.Tortuous
thoracic aorta no pleural effusion. No pneumothorax. Reticulonodular
opacities along the RIGHT heart border. LEFT basilar linear opacity.
Visualized abdomen is unremarkable. No acute osseous abnormality.
IMPRESSION: Reticulonodular opacities along the RIGHT heart border could reflect
underlying atelectasis, aspiration or infection.

## 2022-03-28 IMAGING — CT CT HEAD W/O CM
4 series · 15 of 47 positions shown, 17 images · non-contrast
Comparison: None.

CLINICAL DATA: 64 year old male with fever, left eye vision loss
since [REDACTED] following vaccination for [id number].

EXAM:
CT HEAD WITHOUT CONTRAST
TECHNIQUE: Contiguous axial images were obtained from the base of the skull
through the vertex without intravenous contrast.

[Series 3: head wo · axial · 0.46mm/px · z∈[-116,+4]mm · 7 of 33 slices shown, 9 images]
[im 5/33  brain]
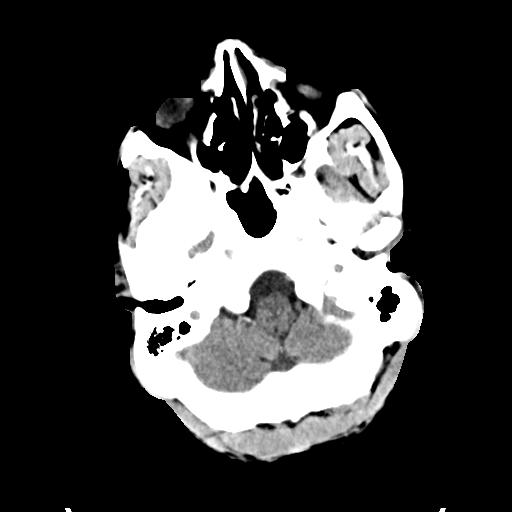
[im 5/33  bone]
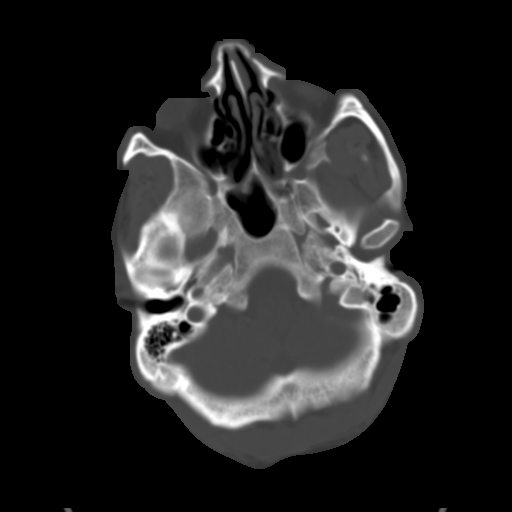
[im 9/33  brain]
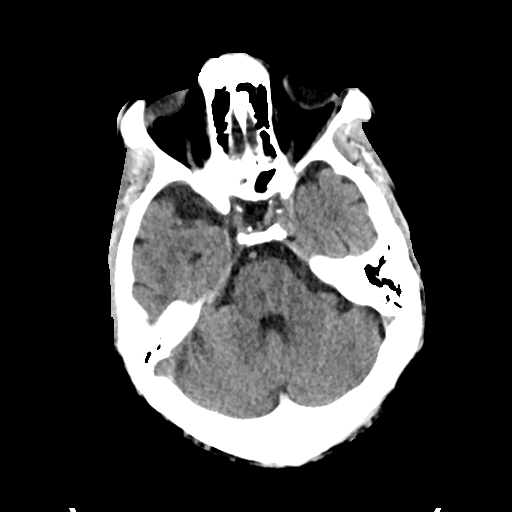
[im 13/33  brain]
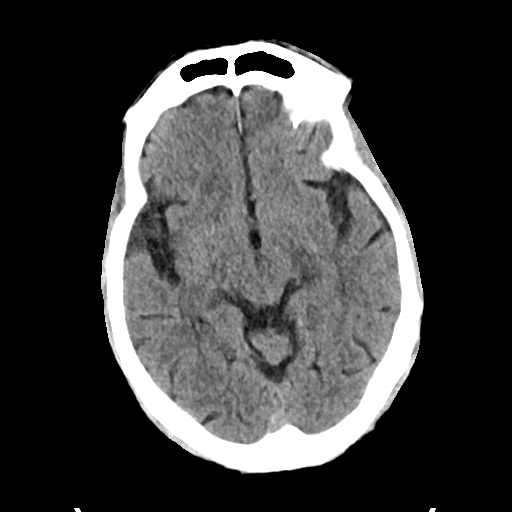
[im 17/33  brain]
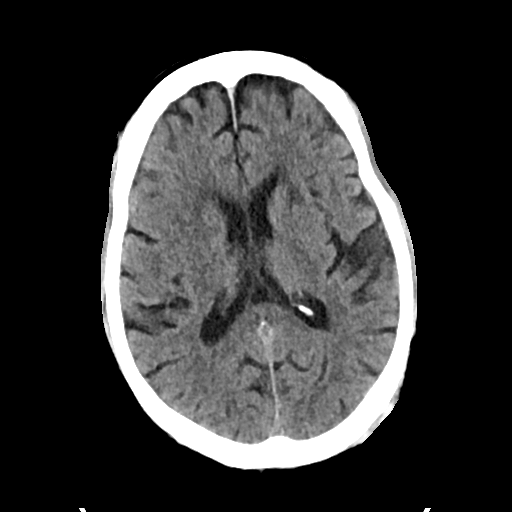
[im 21/33  brain]
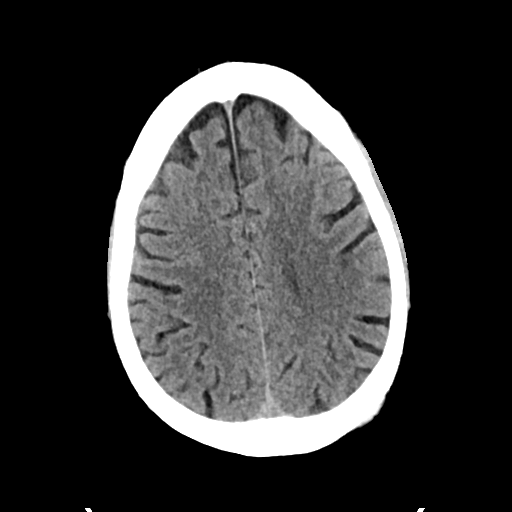
[im 21/33  bone]
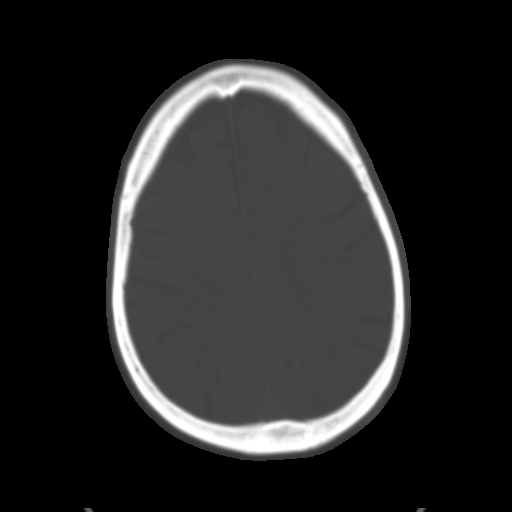
[im 25/33  brain]
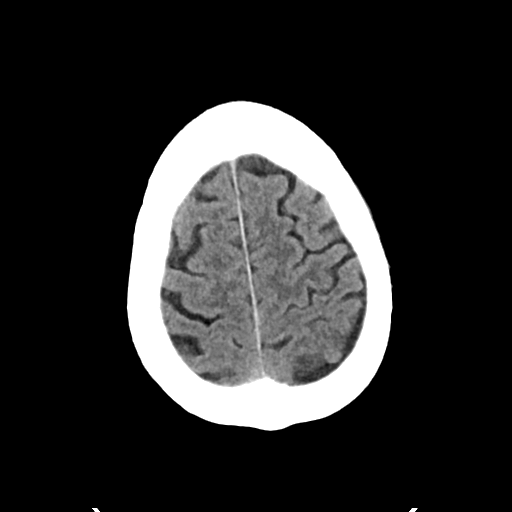
[im 29/33  brain]
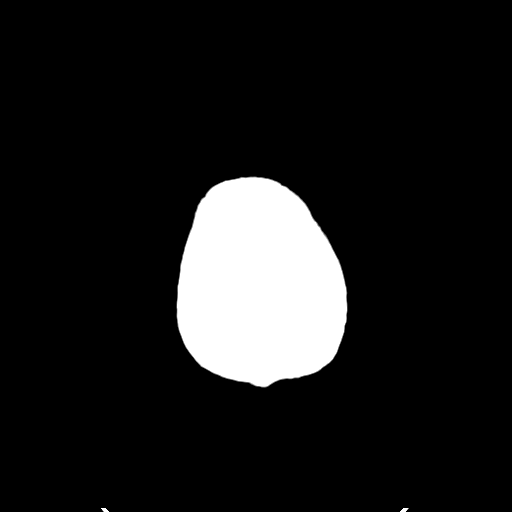

[Series 4: head bone · axial · 0.46mm/px · z∈[-120,-104]mm · 2 of 82 slices shown]
[im 9/82  bone]
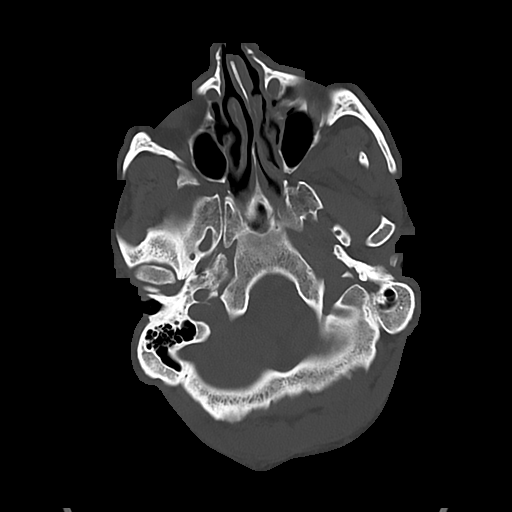
[im 17/82  bone]
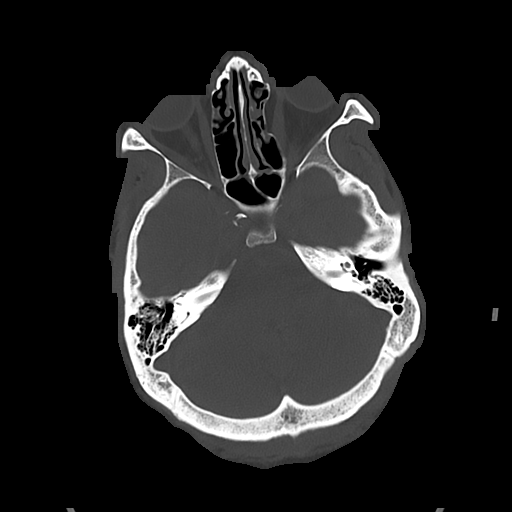

[Series 5: cor soft · coronal · 0.35mm/px · 3 of 79 slices shown]
[im 27/79  brain]
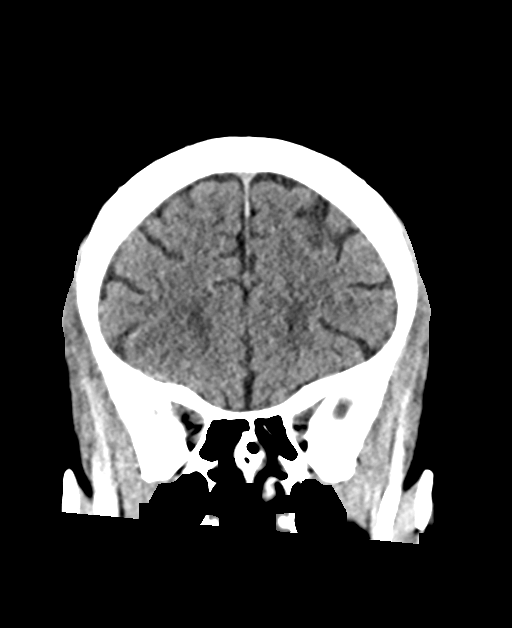
[im 35/79  brain]
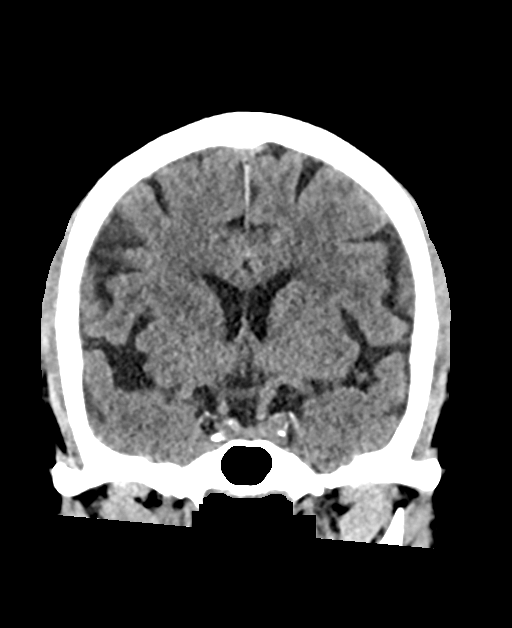
[im 44/79  brain]
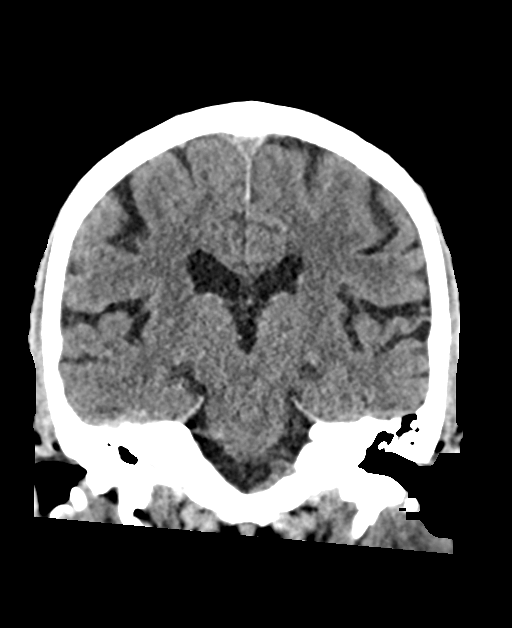

[Series 6: sag soft · sagittal · 0.40mm/px · 3 of 55 slices shown]
[im 19/55  brain]
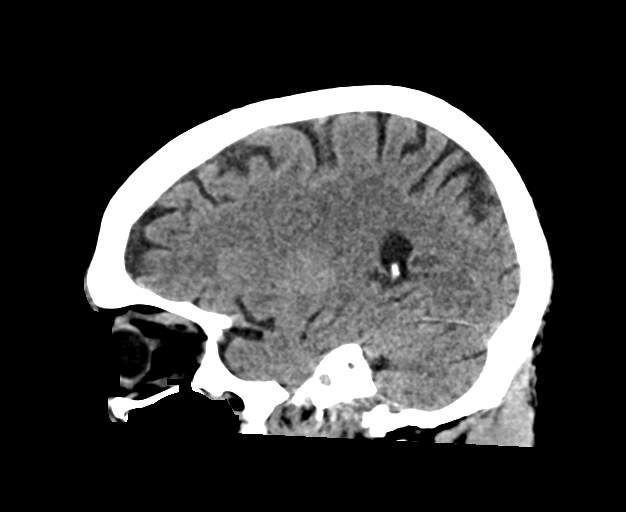
[im 28/55  brain]
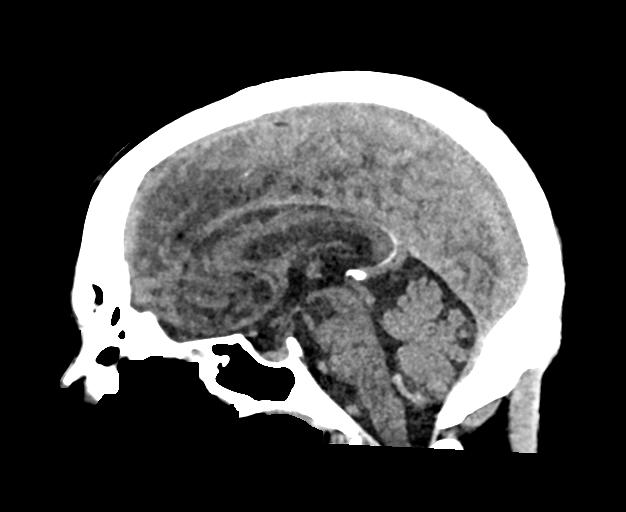
[im 37/55  brain]
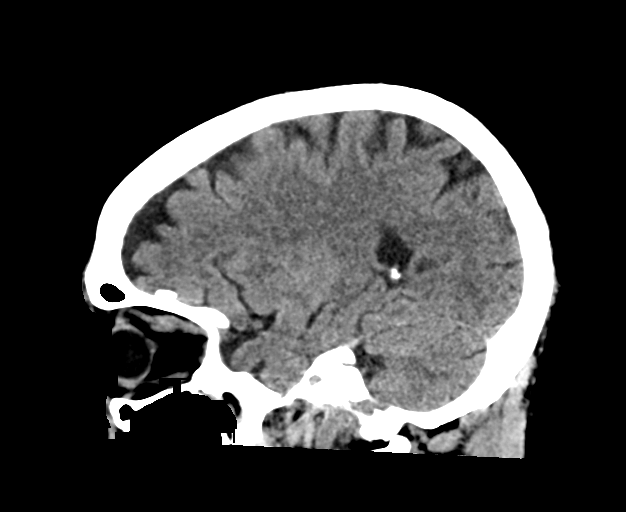

[15 of 47 positions shown; findings below may reference images not displayed]

FINDINGS: Brain: No midline shift, ventriculomegaly, mass effect, evidence of
mass lesion, intracranial hemorrhage or evidence of cortically based
acute infarction. Confluent hypodensity in the anterior deep white
matter capsules greater on the left. Mild additional scattered white
matter hypodensity. Deep gray nuclei remain within normal limits.
But heterogeneity of the pons suggests lacunar infarcts including
right paracentral pontine involvement on series 3, image 9).

Superimposed small area of cystic encephalomalacia in the
posterosuperior left temporal lobe and frontal operculum (series 6,
image 51). More subtle right superior temporal lobe encephalomalacia
(series 3, image 12). No other cortical encephalomalacia identified.

Suprasellar cistern and optic chiasm appear within normal limits.

Vascular: Mild Calcified atherosclerosis at the skull base.

Skull: Chronic left lamina papyracea fracture. No acute osseous
abnormality identified.

Sinuses/Orbits: Chronic left lamina papyracea fracture. Paranasal
sinuses are well aerated. Tympanic cavities and mastoids appear
clear.

Other: 7 mm broad-based area of scalp soft tissue thickening along
the left posterior convexity (series 4, image 43). Underlying
calvarium intact. Other visualized scalp soft tissues are within
normal limits. Disconjugate gaze. Orbits soft tissues otherwise
appear within normal limits.
IMPRESSION: 1. Evidence of small vessel disease including age indeterminate
involvement of the deep white matter capsules and pons. Superimposed
left greater than right temporal/operculum chronic encephalomalacia
might also be post ischemic.

2. Disconjugate gaze and chronic left lamina papyracea fracture,
below but otherwise negative plain CT appearance of the orbits.

## 2022-03-28 IMAGING — CT CT ANGIO CHEST
2 of 7 series · 18 of 46 positions shown · IV contrast (APPLIED)
Comparison: Portable chest x-ray 06/14/2020.

CLINICAL DATA: 64-year-old male with fever and cough.

EXAM:
CT ANGIOGRAPHY CHEST WITH CONTRAST
TECHNIQUE: Multidetector CT imaging of the chest was performed using the
standard protocol during bolus administration of intravenous
contrast. Multiplanar CT image reconstructions and MIPs were
obtained to evaluate the vascular anatomy.
CONTRAST:  75mL OMNIPAQUE IOHEXOL 350 MG/ML SOLN

[Series 7: thins · axial · 0.72mm/px · z∈[-532,-257]mm · 15 of 442 slices shown]
[im 24/442  lung]
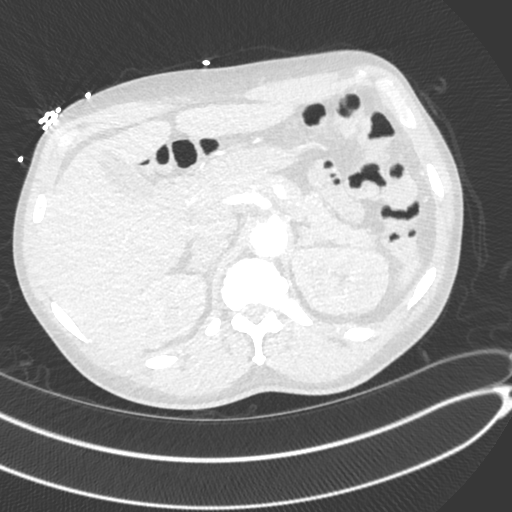
[im 47/442  soft-tissue]
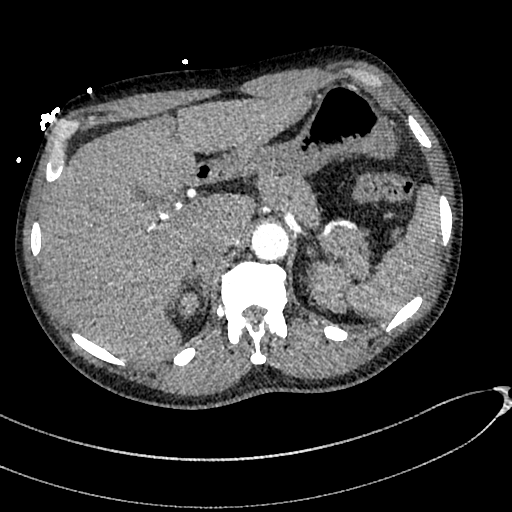
[im 93/442  lung]
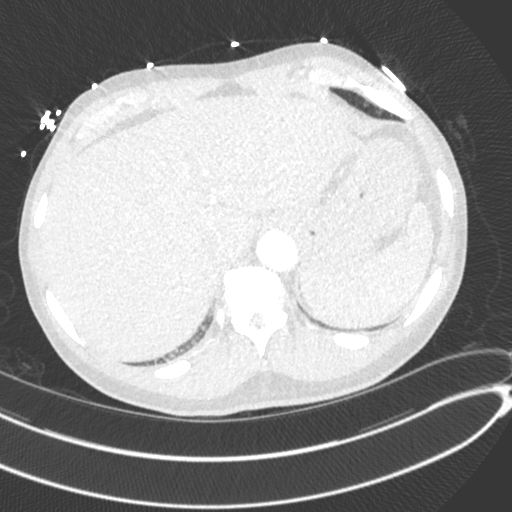
[im 117/442  soft-tissue]
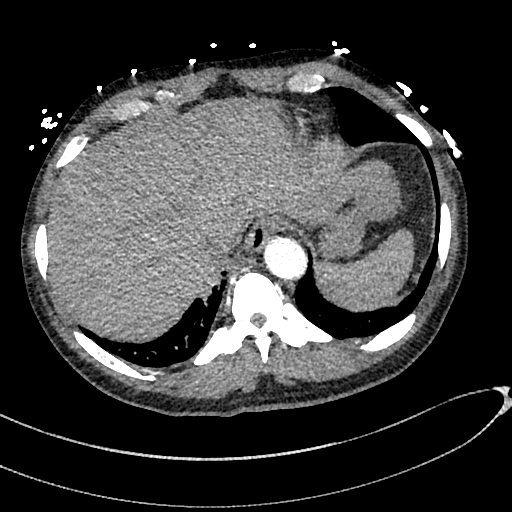
[im 140/442  lung]
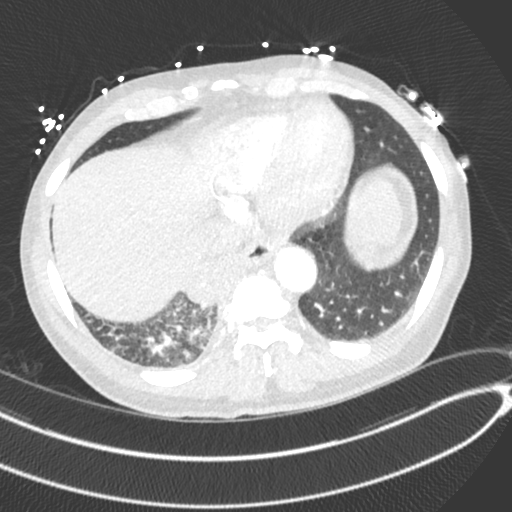
[im 163/442  soft-tissue]
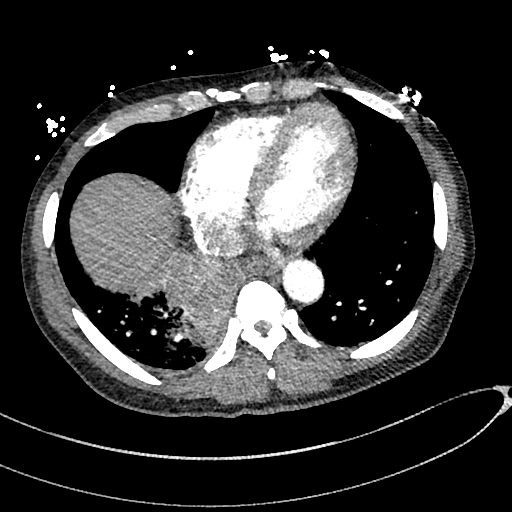
[im 186/442  lung]
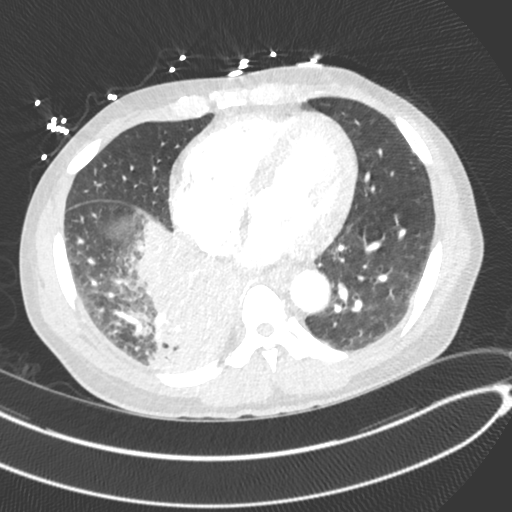
[im 233/442  soft-tissue]
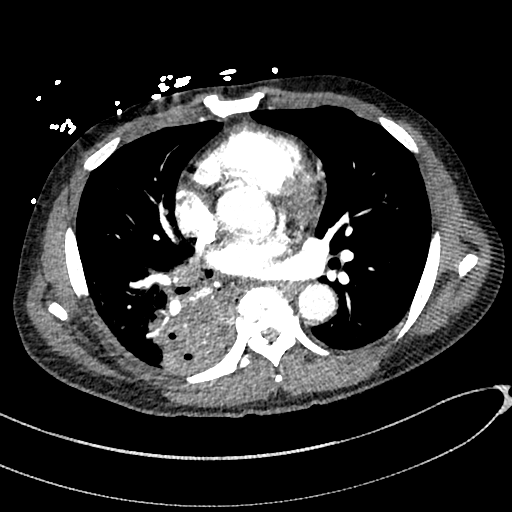
[im 256/442  lung]
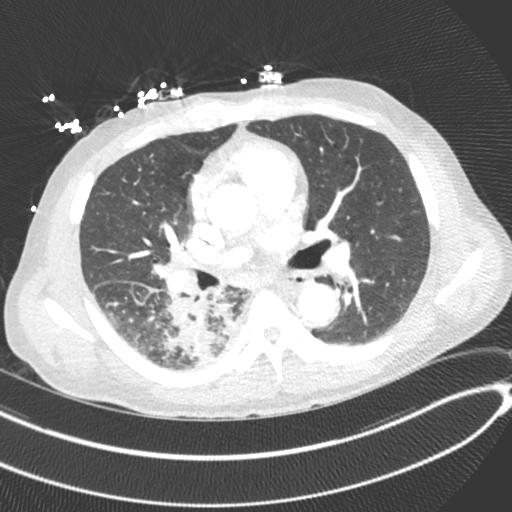
[im 279/442  soft-tissue]
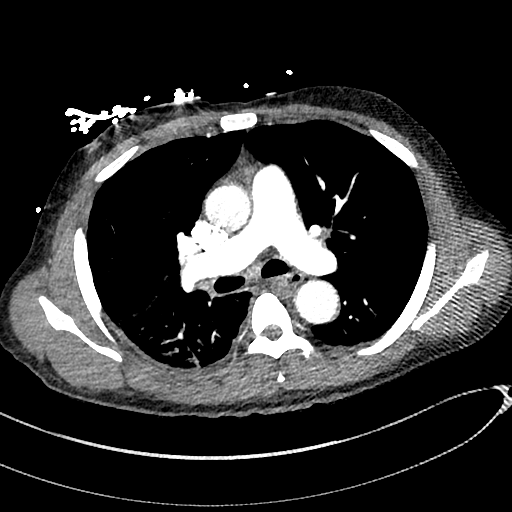
[im 302/442  lung]
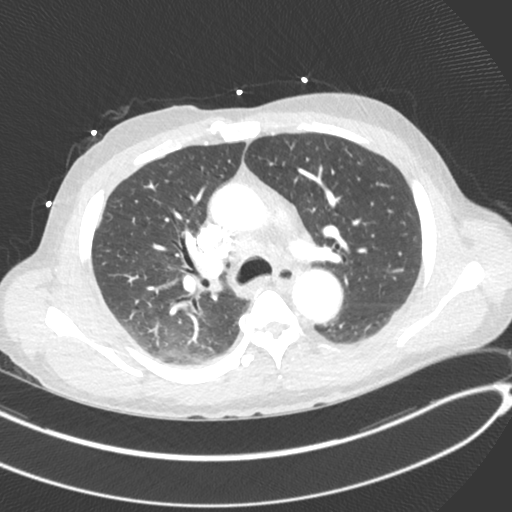
[im 325/442  soft-tissue]
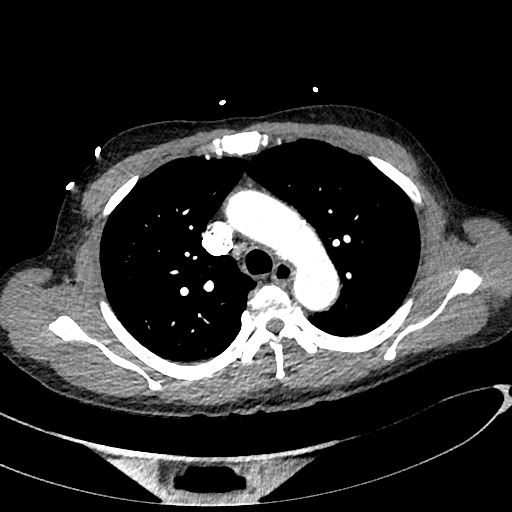
[im 372/442  lung]
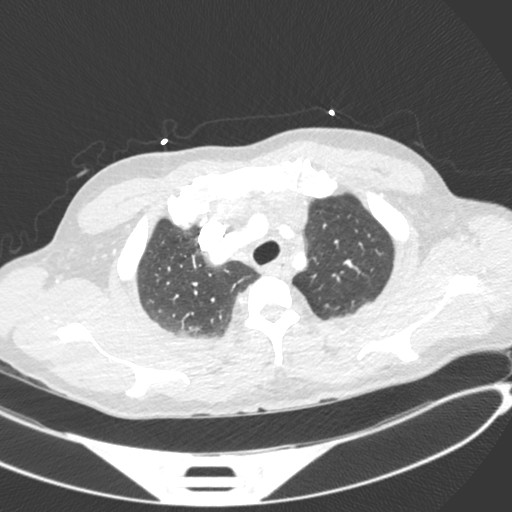
[im 395/442  soft-tissue]
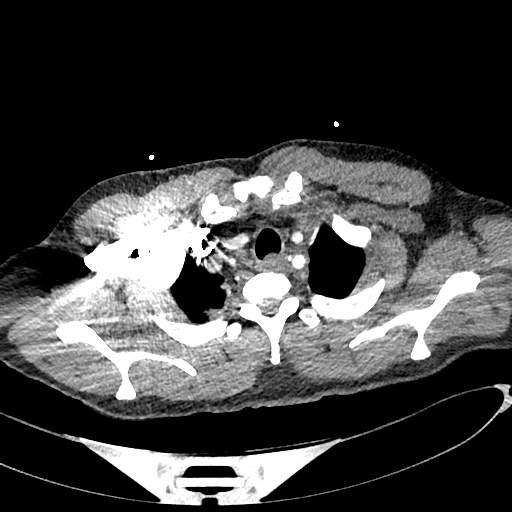
[im 418/442  lung]
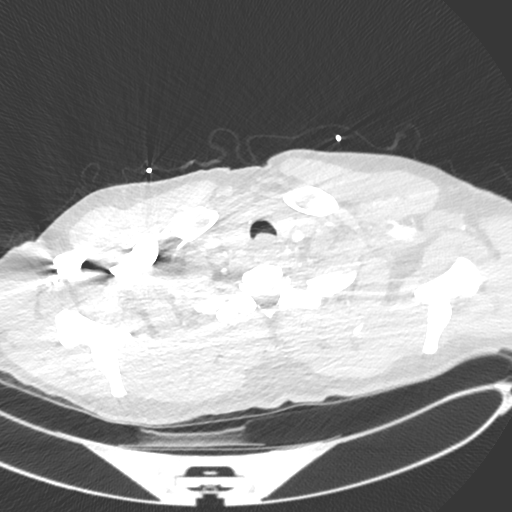

[Series 9: cor · coronal · 0.63mm/px · 3 of 133 slices shown]
[im 34/133  soft-tissue]
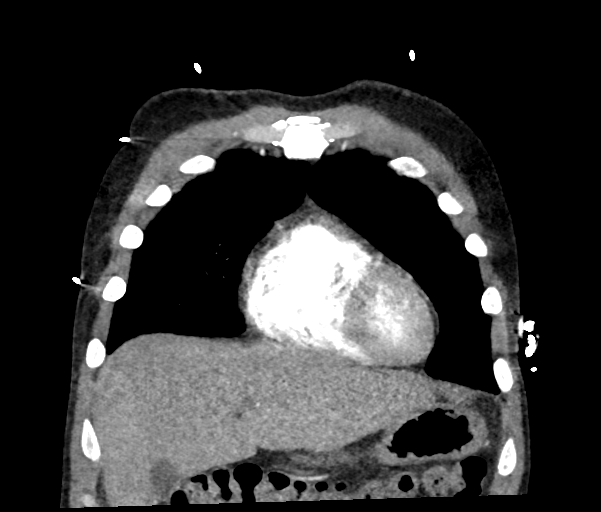
[im 67/133  soft-tissue]
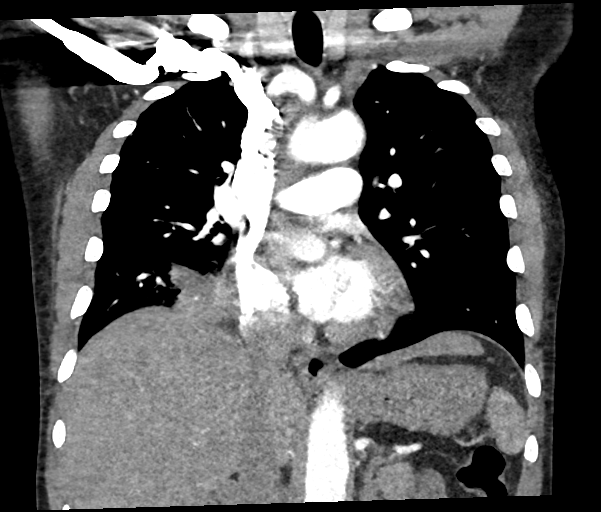
[im 100/133  soft-tissue]
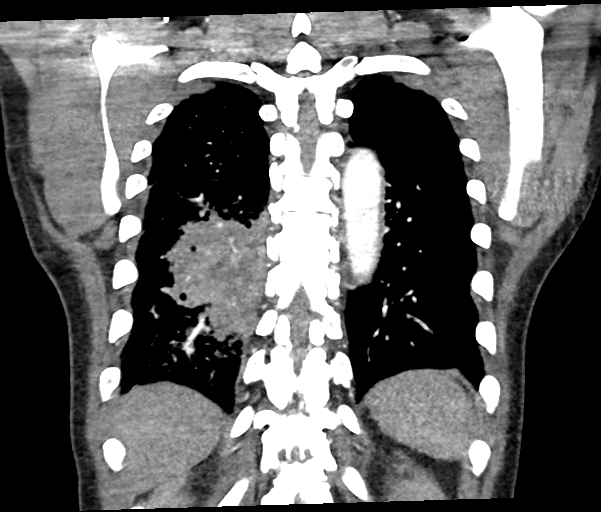

[18 of 46 positions shown; findings below may reference images not displayed]

FINDINGS: Cardiovascular: Good contrast bolus timing in the pulmonary arterial
tree.

No focal filling defect identified in the pulmonary arteries to
suggest acute pulmonary embolism.

No cardiomegaly or pericardial effusion. Negative visible aorta
aside from mild atherosclerosis.

Mediastinum/Nodes: Subcentimeter but reactive appearing mediastinal
lymph nodes. Similar right hilar nodes. No left hilar
lymphadenopathy.

Lungs/Pleura: Large area of right lower lobe consolidation from the
superior segment through the anterior and medial basal segments.
Additional widely scattered lower lobe reticulonodular opacity.

Multiple lucent area suspicious for early cavitation in the
consolidated lung (series 8, image 65). Mass effect on the lower
lobe airways, but the major airways remain patent.

The other bilateral pulmonary lobes appear spared at this time;
minimal dependent opacity in the left lower lobe.

No pneumothorax or pleural effusion.

Upper Abdomen: Negative visible liver, gallbladder, spleen,
pancreas, adrenal glands, kidneys and bowel in the upper abdomen.

Musculoskeletal: No acute or suspicious osseous lesion.

Review of the MIP images confirms the above findings.
IMPRESSION: 1. Lobar pneumonia in the right lower lobe with consolidation and
suspicion of early cavitation. Consider Necrotizing Pneumonia. No
pleural effusion or other complicating features.
2. Negative for acute pulmonary embolus.
3. Reactive appearing mediastinal and right hilar lymph nodes.
4. Aortic Atherosclerosis ([id number]).

## 2023-02-13 ENCOUNTER — Ambulatory Visit: Payer: Medicaid Other

## 2023-02-13 DIAGNOSIS — Z23 Encounter for immunization: Secondary | ICD-10-CM

## 2023-12-12 ENCOUNTER — Encounter: Payer: Self-pay | Admitting: Physician Assistant

## 2023-12-12 ENCOUNTER — Ambulatory Visit: Admitting: Physician Assistant

## 2023-12-12 VITALS — BP 151/85 | HR 68 | Ht 73.0 in | Wt 186.0 lb

## 2023-12-12 DIAGNOSIS — G8929 Other chronic pain: Secondary | ICD-10-CM

## 2023-12-12 DIAGNOSIS — M5442 Lumbago with sciatica, left side: Secondary | ICD-10-CM | POA: Diagnosis not present

## 2023-12-12 DIAGNOSIS — M5441 Lumbago with sciatica, right side: Secondary | ICD-10-CM | POA: Diagnosis not present

## 2023-12-12 DIAGNOSIS — M25512 Pain in left shoulder: Secondary | ICD-10-CM

## 2023-12-12 DIAGNOSIS — I10 Essential (primary) hypertension: Secondary | ICD-10-CM | POA: Diagnosis not present

## 2023-12-12 MED ORDER — IBUPROFEN 800 MG PO TABS: 800.0000 mg | ORAL_TABLET | Freq: Three times a day (TID) | ORAL | 0 refills | Status: DC | PRN

## 2023-12-12 MED ORDER — LISINOPRIL 10 MG PO TABS: 10.0000 mg | ORAL_TABLET | Freq: Every day | ORAL | 1 refills | Status: DC

## 2023-12-12 MED ORDER — METHYLPREDNISOLONE ACETATE 80 MG/ML IJ SUSP
80.0000 mg | Freq: Once | INTRAMUSCULAR | Status: AC
  Administered 2023-12-13: 80 mg via INTRAMUSCULAR

## 2023-12-12 MED ORDER — KETOROLAC TROMETHAMINE 60 MG/2ML IM SOLN
60.0000 mg | Freq: Once | INTRAMUSCULAR | Status: AC
  Administered 2023-12-13: 60 mg via INTRAMUSCULAR

## 2023-12-12 MED ORDER — CYCLOBENZAPRINE HCL 10 MG PO TABS: 10.0000 mg | ORAL_TABLET | Freq: Three times a day (TID) | ORAL | 0 refills | Status: DC | PRN

## 2023-12-12 NOTE — Patient Instructions (Addendum)
 VISIT SUMMARY:  Today, you were seen for worsening left shoulder and lower back pain. We also discussed your hypertension and difficulty sleeping. You received treatments to help manage your pain and were given instructions to help with your blood pressure and sleep issues.  YOUR PLAN:  -CHRONIC LEFT SHOULDER AND LOWER BACK PAIN WITH LUMBAR RADICULOPATHY: This condition involves long-term pain in your shoulder and lower back, with pain radiating to other areas and numbness, likely worsened by cold and past injuries. We administered a steroid injection and a pain relief injection to help with inflammation and pain. You are prescribed ibuprofen  800 mg every 8 hours as needed for pain and a muscle relaxer every 8 hours as needed.   -HYPERTENSION: Hypertension means high blood pressure. Your blood pressure was elevated today at 151/85 mmHg, and you have not been consistently taking your medication. We are restarting your lisinopril  10 mg daily and ask that you monitor your blood pressure daily. We will follow up in two weeks to reassess your blood pressure and how well the medication is working.  How to Take Your Blood Pressure Blood pressure is a measurement of how strongly your blood is pressing against the walls of your arteries. Arteries are blood vessels that carry blood from your heart throughout your body. Your health care provider takes your blood pressure at each office visit. You can also take your own blood pressure at home with a blood pressure monitor. You may need to take your own blood pressure to: Confirm a diagnosis of high blood pressure (hypertension). Monitor your blood pressure over time. Make sure your blood pressure medicine is working. Supplies needed: Blood pressure monitor. A chair to sit in. This should be a chair where you can sit upright with your back supported. Do not sit on a soft couch or an armchair. Table or desk. Small notebook and pencil or pen. How to  prepare To get the most accurate reading, avoid the following for 30 minutes before you check your blood pressure: Drinking caffeine. Drinking alcohol. Eating. Smoking. Exercising. Five minutes before you check your blood pressure: Use the bathroom and urinate so that you have an empty bladder. Sit quietly in a chair. Do not talk. How to take your blood pressure To check your blood pressure, follow the instructions in the manual that came with your blood pressure monitor. If you have a digital blood pressure monitor, the instructions may be as follows: Sit up straight in a chair. Place your feet on the floor. Do not cross your ankles or legs. Rest your left arm at the level of your heart on a table or desk or on the arm of a chair. Pull up your shirt sleeve. Wrap the blood pressure cuff around the upper part of your left arm, 1 inch (2.5 cm) above your elbow. It is best to wrap the cuff around bare skin. Fit the cuff snugly, but not too tightly, around your arm. You should be able to place only one finger between the cuff and your arm. Position the cord so that it rests in the bend of your elbow. Press the power button. Sit quietly while the cuff inflates and deflates. Read the digital reading on the monitor screen and write the numbers down (record them) in a notebook. Wait 2-3 minutes, then repeat the steps, starting at step 1. What does my blood pressure reading mean? A blood pressure reading consists of a higher number over a lower number. Ideally, your blood pressure should  be below 120/80. The first (top) number is called the systolic pressure. It is a measure of the pressure in your arteries as your heart beats. The second (bottom) number is called the diastolic pressure. It is a measure of the pressure in your arteries as the heart relaxes. Blood pressure is classified into four stages. The following are the stages for adults who do not have a short-term serious illness or a  chronic condition. Systolic pressure and diastolic pressure are measured in a unit called mm Hg (millimeters of mercury).  Normal Systolic pressure: below 120. Diastolic pressure: below 80. Elevated Systolic pressure: 120-129. Diastolic pressure: below 80. Hypertension stage 1 Systolic pressure: 130-139. Diastolic pressure: 80-89. Hypertension stage 2 Systolic pressure: 140 or above. Diastolic pressure: 90 or above. You can have elevated blood pressure or hypertension even if only the systolic or only the diastolic number in your reading is higher than normal. Follow these instructions at home: Medicines Take over-the-counter and prescription medicines only as told by your health care provider. Tell your health care provider if you are having any side effects from blood pressure medicine. General instructions Check your blood pressure as often as recommended by your health care provider. Check your blood pressure at the same time every day. Take your monitor to the next appointment with your health care provider to make sure that: You are using it correctly. It provides accurate readings. Understand what your goal blood pressure numbers are. Keep all follow-up visits. This is important. General tips Your health care provider can suggest a reliable monitor that will meet your needs. There are several types of home blood pressure monitors. Choose a monitor that has an arm cuff. Do not choose a monitor that measures your blood pressure from your wrist or finger. Choose a cuff that wraps snugly, not too tight or too loose, around your upper arm. You should be able to fit only one finger between your arm and the cuff. You can buy a blood pressure monitor at most drugstores or online. Where to find more information American Heart Association: www.heart.org Contact a health care provider if: Your blood pressure is consistently high. Your blood pressure is suddenly low. Get help right  away if: Your systolic blood pressure is higher than 180. Your diastolic blood pressure is higher than 120. These symptoms may be an emergency. Get help right away. Call 911. Do not wait to see if the symptoms will go away. Do not drive yourself to the hospital. Summary Blood pressure is a measurement of how strongly your blood is pressing against the walls of your arteries. A blood pressure reading consists of a higher number over a lower number. Ideally, your blood pressure should be below 120/80. Check your blood pressure at the same time every day. Avoid caffeine, alcohol, smoking, and exercise for 30 minutes prior to checking your blood pressure. These agents can affect the accuracy of the blood pressure reading. This information is not intended to replace advice given to you by your health care provider. Make sure you discuss any questions you have with your health care provider. Document Revised: 01/08/2021 Document Reviewed: 01/08/2021 Elsevier Patient Education  2024 [employer].

## 2023-12-12 NOTE — Progress Notes (Unsigned)
 New Patient Office Visit  Subjective    Patient ID: [name] [name], male    DOB: [birth year]  Age: 68 y.o. MRN: [id number]  CC:  Chief Complaint  Patient presents with   Shoulder Pain        Back Pain    Lower back pain, possible sleeping arrangements   Discussed the use of AI scribe software for clinical note transcription with the patient, who gave verbal consent to proceed.  History of Present Illness   [name] [name] is a 68 year old male who presents with worsening left shoulder and lower back pain.  He is currently being treated for substance abuse at [facility] recovery center.  He has experienced left shoulder pain for several years, with recent worsening. The pain radiates to his elbow and worsens in cold conditions. He attributes this to previous football activities.He experiences difficulty sleeping, attributing it to an uncomfortable mattress and pain, and uses over-the-counter sleep aids. He reports numbness and tingling in his hands and has a history of carpal tunnel syndrome.  His lower back pain has persisted for years, leading to four surgeries. The pain radiates down both hips, with associated numbness and tingling. He manages the pain with Advil , taking 800 mg doses, which provides some relief.   He takes lisinopril  10 mg for hypertension but acknowledges previous inconsistent use and has not been taking this.  Does not check his blood pressure on a daily basis . he was treated for hepatitis C a couple of years ago.     Outpatient Encounter Medications as of 12/12/2023  Medication Sig   cyclobenzaprine  (FLEXERIL ) 10 MG tablet Take 1 tablet (10 mg total) by mouth 3 (three) times daily as needed for muscle spasms.   ibuprofen  (ADVIL ) 800 MG tablet Take 1 tablet (800 mg total) by mouth every 8 (eight) hours as needed.   lisinopril  (ZESTRIL ) 10 MG tablet Take 1 tablet (10 mg total) by mouth daily.   aspirin  EC 81 MG EC tablet Take 1 tablet (81 mg total) by mouth daily.  Swallow whole.   colchicine  0.6 MG tablet On the first day, take 2 tablets by mouth. Wait one hour and take one more tablet. On days 2-5, take one tablet daily.   folic acid  (FOLVITE ) 1 MG tablet Take 1 tablet (1 mg total) by mouth daily.   tamsulosin  (FLOMAX ) 0.4 MG CAPS capsule Take 1 capsule (0.4 mg total) by mouth daily.   thiamine  100 MG tablet Take 1 tablet (100 mg total) by mouth daily.   [DISCONTINUED] hydrochlorothiazide  (HYDRODIURIL ) 25 MG tablet Take 1 tablet (25 mg total) by mouth daily.   [DISCONTINUED] lisinopril  (ZESTRIL ) 40 MG tablet Take 40 mg by mouth daily.   [EXPIRED] ketorolac  (TORADOL ) injection 60 mg    [EXPIRED] methylPREDNISolone  acetate (DEPO-MEDROL ) injection 80 mg    No facility-administered encounter medications on file as of 12/12/2023.    Past Medical History:  Diagnosis Date   Arthritis    right hip   Disc displacement, lumbar    Gout    Hypertension    Takes medications daily    Past Surgical History:  Procedure Laterality Date   BACK SURGERY  1980s   x2:2013one back surgery   BRONCHIAL BIOPSY  06/19/2020   Procedure: BRONCHIAL BIOPSIES;  Surgeon: Jude Harden GAILS, MD;  Location: [facility] ENDOSCOPY;  Service: Cardiopulmonary;;   BRONCHIAL BRUSHINGS  06/19/2020   Procedure: BRONCHIAL BRUSHINGS;  Surgeon: Jude Harden GAILS, MD;  Location: [facility] ENDOSCOPY;  Service: Cardiopulmonary;;  BRONCHIAL WASHINGS  06/19/2020   Procedure: BRONCHIAL WASHINGS;  Surgeon: Jude Harden GAILS, MD;  Location: [facility] ENDOSCOPY;  Service: Cardiopulmonary;;   CHEST TUBE INSERTION N/A 07/03/2020   Procedure: CHEST TUBE INSERTION;  Surgeon: Mannam, Praveen, MD;  Location: [facility] ENDOSCOPY;  Service: Cardiopulmonary;  Laterality: N/A;   COLONOSCOPY WITH PROPOFOL  N/A 09/18/2013   Procedure: COLONOSCOPY WITH PROPOFOL ;  Surgeon: Gladis MARLA Louder, MD;  Location: [facility] ENDOSCOPY;  Service: Endoscopy;  Laterality: N/A;   IR THORACENTESIS ASP PLEURAL SPACE W/IMG GUIDE  07/02/2020   VIDEO BRONCHOSCOPY N/A 06/19/2020    Procedure: VIDEO BRONCHOSCOPY WITH FLUORO;  Surgeon: Jude Harden GAILS, MD;  Location: [facility] ENDOSCOPY;  Service: Cardiopulmonary;  Laterality: N/A;    Family History  Problem Relation Age of Onset   Hypertension Mother     Social History   Socioeconomic History   Marital status: Single    Spouse name: Not on file   Number of children: Not on file   Years of education: Not on file   Highest education level: Not on file  Occupational History   Not on file  Tobacco Use   Smoking status: Every Day    Current packs/day: 0.30    Average packs/day: 0.3 packs/day for 35.0 years (10.5 ttl pk-yrs)    Types: Cigarettes   Smokeless tobacco: Never  Substance and Sexual Activity   Alcohol use: Yes    Alcohol/week: 0.0 standard drinks of alcohol    Comment: occasionally   Drug use: Not Currently    Types: Marijuana, Cocaine    Comment: last date use: 07.05.2025   Sexual activity: Not Currently  Other Topics Concern   Not on file  Social History Narrative   Not on file   Social Drivers of Health   Financial Resource Strain: Not on file  Food Insecurity: Not on file  Transportation Needs: Not on file  Physical Activity: Not on file  Stress: Not on file  Social Connections: Not on file  Intimate Partner Violence: Not on file    Review of Systems  Constitutional: Negative.   HENT: Negative.    Eyes: Negative.   Respiratory:  Negative for shortness of breath.   Cardiovascular:  Negative for chest pain.  Gastrointestinal: Negative.   Genitourinary: Negative.   Musculoskeletal:  Positive for back pain, joint pain and myalgias.  Skin: Negative.   Neurological: Negative.   Endo/Heme/Allergies: Negative.   Psychiatric/Behavioral: Negative.          Objective    BP (!) 151/85 (BP Location: Left Arm, Patient Position: Sitting, Cuff Size: Large)   Pulse 68   Ht 6' 1 (1.854 m)   Wt 186 lb (84.4 kg)   SpO2 97%   BMI 24.54 kg/m   Physical Exam Vitals and nursing note  reviewed.  Constitutional:      Appearance: Normal appearance.  HENT:     Head: Normocephalic and atraumatic.     Right Ear: External ear normal.     Left Ear: External ear normal.     Nose: Nose normal.     Mouth/Throat:     Mouth: Mucous membranes are moist.     Pharynx: Oropharynx is clear.  Eyes:     Extraocular Movements: Extraocular movements intact.     Conjunctiva/sclera: Conjunctivae normal.     Pupils: Pupils are equal, round, and reactive to light.  Cardiovascular:     Rate and Rhythm: Normal rate and regular rhythm.     Pulses: Normal pulses.  Heart sounds: Normal heart sounds.  Pulmonary:     Effort: Pulmonary effort is normal.     Breath sounds: Normal breath sounds.  Musculoskeletal:     Right shoulder: Normal.     Left shoulder: Bony tenderness present. No swelling. Decreased range of motion. Decreased strength.     Right hand: Normal.     Left hand: No swelling. Decreased strength.     Cervical back: Normal, normal range of motion and neck supple.     Thoracic back: Normal.     Lumbar back: No tenderness. Decreased range of motion.  Skin:    General: Skin is warm and dry.  Neurological:     General: No focal deficit present.     Mental Status: He is alert and oriented to person, place, and time.  Psychiatric:        Mood and Affect: Mood normal.        Behavior: Behavior normal.        Thought Content: Thought content normal.        Judgment: Judgment normal.       Assessment & Plan:   Problem List Items Addressed This Visit       Cardiovascular and Mediastinum   Hypertension - Primary   Relevant Medications   lisinopril  (ZESTRIL ) 10 MG tablet   Other Visit Diagnoses       Chronic bilateral low back pain with bilateral sciatica       Relevant Medications   methylPREDNISolone  acetate (DEPO-MEDROL ) injection 80 mg (Completed)   ketorolac  (TORADOL ) injection 60 mg (Completed)   ibuprofen  (ADVIL ) 800 MG tablet   cyclobenzaprine  (FLEXERIL )  10 MG tablet     Chronic left shoulder pain       Relevant Medications   methylPREDNISolone  acetate (DEPO-MEDROL ) injection 80 mg (Completed)   ketorolac  (TORADOL ) injection 60 mg (Completed)   ibuprofen  (ADVIL ) 800 MG tablet   cyclobenzaprine  (FLEXERIL ) 10 MG tablet       Assessment and Plan Chronic left shoulder and lower back pain with lumbar radiculopathy Chronic pain with radiation and numbness, likely exacerbated by cold and past injuries. History of four back surgeries. - Administer steroid injection for inflammation. - Administer pain relief injection. - Prescribe ibuprofen  800 mg every 8 hours as needed for pain. - Prescribe muscle relaxer every 8 hours as needed. Patient education given on supportive care, red flags for prompt reevaluation Patient to follow-up with mobile unit in 2 weeks  Hypertension Elevated blood pressure at 151/85 mmHg. Noncompliance with previous medication regimen. - Restart lisinopril  10 mg daily. - Monitor blood pressure daily. - Schedule follow-up in two weeks to reassess blood pressure and medication efficacy.   General Health Maintenance Due for routine health maintenance. - Schedule fasting labs in two weeks.   I have reviewed the patient's medical history (PMH, PSH, Social History, Family History, Medications, and allergies) , and have been updated if relevant. I spent 30 minutes reviewing chart and  face to face time with patient.    Return in about 2 weeks (around 12/26/2023) for With [facility].   Kirk RAMAN Mayers, PA-C

## 2023-12-13 DIAGNOSIS — M25512 Pain in left shoulder: Secondary | ICD-10-CM | POA: Diagnosis not present

## 2023-12-13 DIAGNOSIS — M5442 Lumbago with sciatica, left side: Secondary | ICD-10-CM | POA: Diagnosis not present

## 2023-12-13 DIAGNOSIS — M5441 Lumbago with sciatica, right side: Secondary | ICD-10-CM | POA: Diagnosis not present

## 2023-12-26 ENCOUNTER — Encounter: Payer: Self-pay | Admitting: Physician Assistant

## 2023-12-26 ENCOUNTER — Ambulatory Visit: Admitting: Physician Assistant

## 2023-12-26 VITALS — BP 138/82 | HR 71 | Ht 73.0 in | Wt 191.0 lb

## 2023-12-26 DIAGNOSIS — Z13220 Encounter for screening for lipoid disorders: Secondary | ICD-10-CM

## 2023-12-26 DIAGNOSIS — I10 Essential (primary) hypertension: Secondary | ICD-10-CM | POA: Diagnosis not present

## 2023-12-26 DIAGNOSIS — M25512 Pain in left shoulder: Secondary | ICD-10-CM | POA: Diagnosis not present

## 2023-12-26 DIAGNOSIS — F1020 Alcohol dependence, uncomplicated: Secondary | ICD-10-CM | POA: Diagnosis not present

## 2023-12-26 DIAGNOSIS — Z125 Encounter for screening for malignant neoplasm of prostate: Secondary | ICD-10-CM

## 2023-12-26 DIAGNOSIS — F10220 Alcohol dependence with intoxication, uncomplicated: Secondary | ICD-10-CM

## 2023-12-26 DIAGNOSIS — M545 Low back pain, unspecified: Secondary | ICD-10-CM | POA: Diagnosis not present

## 2023-12-26 DIAGNOSIS — F17210 Nicotine dependence, cigarettes, uncomplicated: Secondary | ICD-10-CM

## 2023-12-26 DIAGNOSIS — G8929 Other chronic pain: Secondary | ICD-10-CM

## 2023-12-26 NOTE — Progress Notes (Signed)
 Established Patient Office Visit  Subjective   Patient ID: [name] [name], male    DOB: [birth year]  Age: 68 y.o. MRN: [id number]  Chief Complaint  Patient presents with   Shoulder Pain    Shoulder pain has improved since starting medication   Leg Pain    Patient does still experience shooting pain that radiates from hips down legs    Hypertension   Discussed the use of AI scribe software for clinical note transcription with the patient, who gave verbal consent to proceed.  History of Present Illness   [name] [name] is a 68 year old male with hypertension who presents for blood pressure management and evaluation of shoulder pain.  He continues to be treated for substance abuse at [facility] recovery center, states that he will be returning to his home in [place] tomorrow.  Previous primary care provider was in [place]   He has elevated blood pressure readings of 152/91, 148/86, and 142/82, with subsequent readings of 138/86 and 138/82. He is on lisinopril  but missed a dose yesterday. He experienced back pain with past blood pressure medication and is currently experiencing similar pain. He does not tolerate  hydrochlorothiazide  and previously found a higher dose of lisinopril  intolerable.  He has worsening left shoulder pain extending from his hips to the back of his leg. He has undergone four back surgeries. A previous steroid injection provided some relief. Current medications include 800 mg of ibuprofen  and a muscle relaxer, both ineffective. Gabapentin  was tried in the past without benefit.      Past Medical History:  Diagnosis Date   Arthritis    right hip   Disc displacement, lumbar    Gout    Hypertension    Takes medications daily   Social History   Socioeconomic History   Marital status: Single    Spouse name: Not on file   Number of children: Not on file   Years of education: Not on file   Highest education level: Not on file  Occupational  History   Not on file  Tobacco Use   Smoking status: Every Day    Current packs/day: 0.30    Average packs/day: 0.3 packs/day for 35.0 years (10.5 ttl pk-yrs)    Types: Cigarettes   Smokeless tobacco: Never  Substance and Sexual Activity   Alcohol use: Yes    Alcohol/week: 0.0 standard drinks of alcohol    Comment: occasionally   Drug use: Not Currently    Types: Marijuana, Cocaine    Comment: last date use: 07.05.2025   Sexual activity: Not Currently  Other Topics Concern   Not on file  Social History Narrative   Not on file   Social Drivers of Health   Financial Resource Strain: Not on file  Food Insecurity: Not on file  Transportation Needs: Not on file  Physical Activity: Not on file  Stress: Not on file  Social Connections: Not on file  Intimate Partner Violence: Not on file   Family History  Problem Relation Age of Onset   Hypertension Mother    Allergies  Allergen Reactions   Hctz [Hydrochlorothiazide ] Other (See Comments)    Erectile dysfunction    Review of Systems  Constitutional: Negative.   HENT: Negative.    Eyes: Negative.   Respiratory:  Negative for shortness of breath.   Cardiovascular:  Negative for chest pain.  Gastrointestinal: Negative.   Genitourinary: Negative.   Musculoskeletal:  Positive for back pain, joint pain and myalgias.  Skin:  Negative.   Neurological: Negative.   Endo/Heme/Allergies: Negative.   Psychiatric/Behavioral: Negative.        Objective:     BP 138/82 (BP Location: Left Arm, Patient Position: Sitting, Cuff Size: Large)   Pulse 71   Ht 6' 1 (1.854 m)   Wt 191 lb (86.6 kg)   SpO2 97%   BMI 25.20 kg/m  BP Readings from Last 3 Encounters:  12/26/23 138/82  12/12/23 (!) 151/85  04/18/21 (!) 180/94   Wt Readings from Last 3 Encounters:  12/26/23 191 lb (86.6 kg)  12/12/23 186 lb (84.4 kg)  07/03/20 172 lb 9.9 oz (78.3 kg)    Physical Exam Vitals and nursing note reviewed.  Constitutional:       Appearance: Normal appearance.  HENT:     Head: Normocephalic and atraumatic.     Right Ear: External ear normal.     Left Ear: External ear normal.     Nose: Nose normal.     Mouth/Throat:     Mouth: Mucous membranes are moist.     Pharynx: Oropharynx is clear.  Eyes:     Extraocular Movements: Extraocular movements intact.     Conjunctiva/sclera: Conjunctivae normal.     Pupils: Pupils are equal, round, and reactive to light.  Cardiovascular:     Rate and Rhythm: Normal rate and regular rhythm.     Pulses: Normal pulses.     Heart sounds: Normal heart sounds.  Pulmonary:     Effort: Pulmonary effort is normal.     Breath sounds: Normal breath sounds.  Musculoskeletal:     Right shoulder: No bony tenderness. Decreased range of motion. Decreased strength.     Left shoulder: Normal.     Cervical back: Normal and normal range of motion.     Thoracic back: Normal.     Lumbar back: No tenderness. Decreased range of motion.  Skin:    General: Skin is warm and dry.  Neurological:     General: No focal deficit present.     Mental Status: He is alert and oriented to person, place, and time.  Psychiatric:        Mood and Affect: Mood normal.        Behavior: Behavior normal.        Thought Content: Thought content normal.        Judgment: Judgment normal.       Assessment & Plan:   Problem List Items Addressed This Visit       Cardiovascular and Mediastinum   Hypertension   Relevant Orders   CBC with Differential/Platelet   Comp. Metabolic Panel (12)     Other   Alcohol dependence (HCC)   Other Visit Diagnoses       Chronic bilateral low back pain with bilateral sciatica    -  Primary   Relevant Orders   Ambulatory referral to Orthopedic Surgery     Chronic left shoulder pain       Relevant Orders   Ambulatory referral to Orthopedic Surgery     Screening, lipid       Relevant Orders   Lipid panel     Screening PSA (prostate specific antigen)       Relevant  Orders   PSA       Assessment and Plan Hypertension Hypertension with recent elevated readings. Inconsistent adherence to lisinopril . Possible back pain side effect from lisinopril . He prefers to continue lisinopril  until he can verify previous medication. - Patient encouraged  to continue checking blood pressure on a daily basis, keeping a written log and having available for all office visits.  Patient to return to mobile unit in 2 weeks for follow-up.  Will bring previous medications with him.  Red flags given for prompt reevaluation.  Chronic left shoulder pain Chronic left shoulder pain with recent exacerbation. Current pain management ineffective. - Refer to orthopedics for further evaluation and management.   Chronic back pain with history of spinal surgery Chronic back pain with history of four spinal surgeries. Current pain management ineffective. - Refer to orthopedics for further evaluation and management.  Alcohol abuse Currently in substance abuse treatment program  Fasting labs completed today   I have reviewed the patient's medical history (PMH, PSH, Social History, Family History, Medications, and allergies) , and have been updated if relevant. I spent 30 minutes reviewing chart and  face to face time with patient.    Return in about 2 weeks (around 01/09/2024) for With [facility].    Kirk RAMAN Mayers, PA-C

## 2023-12-26 NOTE — Patient Instructions (Addendum)
 VISIT SUMMARY:  Today, you were seen for management of your blood pressure and evaluation of your shoulder pain. We discussed your current medications, their effectiveness, and any side effects you are experiencing. We also reviewed your history of back pain and previous treatments.  YOUR PLAN:  -HYPERTENSION: Hypertension means high blood pressure. Your recent readings have been elevated, and you mentioned missing a dose of your medication, lisinopril . We will continue with lisinopril  for now, and you should monitor your blood pressure at home. If your back pain continues, we may need to consider changing your medication.  -CHRONIC LEFT SHOULDER PAIN: Chronic left shoulder pain means ongoing pain in your left shoulder. Your current pain management is not effective, so we will refer you to orthopedics for further evaluation and management.   -CHRONIC BACK PAIN WITH HISTORY OF SPINAL SURGERY: Chronic back pain means ongoing pain in your back, and you have had multiple spinal surgeries in the past. Your current pain management is not effective, so we will refer you to orthopedics for further evaluation and management.  Chronic Back Pain Chronic back pain is back pain that lasts longer than 3 months. The cause of your back pain may not be known. Some common causes include: Wear and tear (degenerative disease) of the bones, disks, or tissues that connect bones to each other (ligaments) in your back. Inflammation and stiffness in your back (arthritis). If you have chronic back pain, you may have times when the pain is more intense (flare-ups). You can also learn to manage the pain with home care. Follow these instructions at home: Watch for any changes in your symptoms. Take these actions to help with your pain: Managing pain and stiffness     If told, put ice on the painful area. You may be told to apply ice for the first 24-48 hours after a flare-up starts. Put ice in a plastic bag. Place a  towel between your skin and the bag. Leave the ice on for 20 minutes, 2-3 times per day. If told, apply heat to the affected area as often as told by your health care provider. Use the heat source that your provider recommends, such as a moist heat pack or a heating pad. Place a towel between your skin and the heat source. Leave the heat on for 20-30 minutes. If your skin turns bright red, remove the ice or heat right away to prevent skin damage. The risk of damage is higher if you cannot feel pain, heat, or cold. Try soaking in a warm tub. Activity        Avoid bending and other activities that make the pain worse. Have good posture when you stand or sit. When you stand, keep your upper back and neck straight, with your shoulders pulled back. Avoid slouching. When you sit, keep your back straight. Relax your shoulders. Do not round your shoulders or pull them backward. Do not sit or stand in one place for too long. Take brief periods of rest during the day. This will reduce your pain. Resting in a lying or standing position is often better than sitting to rest. When you rest for longer periods, mix in some mild activity or stretching between periods of rest. This will help to prevent stiffness and pain. Get regular exercise. Ask your provider what activities are safe for you. You may have to avoid lifting. Ask your provider how much you can safely lift. If you do lift, always use the right technique. This means you  should: Bend your knees. Keep the load close to your body. Avoid twisting. Medicines Take over-the-counter and prescription medicines only as told by your provider. You may need to take medicines for pain and inflammation. These may be taken by mouth or put on the skin. You may also be given muscle relaxants. Ask your provider if the medicine prescribed to you: Requires you to avoid driving or using machinery. Can cause constipation. You may need to take these actions to  prevent or treat constipation: Drink enough fluid to keep your pee (urine) pale yellow. Take over-the-counter or prescription medicines. Eat foods that are high in fiber, such as beans, whole grains, and fresh fruits and vegetables. Limit foods that are high in fat and processed sugars, such as fried or sweet foods. General instructions  Sleep on a firm mattress in a comfortable position. Try lying on your side with your knees slightly bent. If you lie on your back, put a pillow under your knees. Do not use any products that contain nicotine  or tobacco. These products include cigarettes, chewing tobacco, and vaping devices, such as e-cigarettes. If you need help quitting, ask your provider. Contact a health care provider if: You have pain that does not get better with rest or medicine. You have new pain. You have a fever. You lose weight quickly. You have trouble doing your normal activities. You feel weak or numb in one or both of your legs or feet. Get help right away if: You are not able to control when you pee or poop. You have severe back pain and: Nausea or vomiting. Pain in your chest or abdomen. Shortness of breath. You faint. These symptoms may be an emergency. Get help right away. Call 911. Do not wait to see if the symptoms will go away. Do not drive yourself to the hospital. This information is not intended to replace advice given to you by your health care provider. Make sure you discuss any questions you have with your health care provider. Document Revised: 12/14/2021 Document Reviewed: 12/14/2021 Elsevier Patient Education  2024 [employer].

## 2023-12-28 LAB — LIPID PANEL
Chol/HDL Ratio: 2.9 ratio (ref 0.0–5.0)
Cholesterol, Total: 170 mg/dL (ref 100–199)
HDL: 58 mg/dL (ref >39)
LDL Chol Calc (NIH): 97 mg/dL (ref 0–99)
Triglycerides: 79 mg/dL (ref 0–149)
VLDL Cholesterol Cal: 15 mg/dL (ref 5–40)

## 2023-12-28 LAB — COMP. METABOLIC PANEL (12)
AST: 17 IU/L (ref 0–40)
Albumin: 4.4 g/dL (ref 3.9–4.9)
Alkaline Phosphatase: 83 IU/L (ref 44–121)
BUN/Creatinine Ratio: 23 (ref 10–24)
BUN: 33 mg/dL — ABNORMAL HIGH (ref 8–27)
Bilirubin Total: 0.4 mg/dL (ref 0.0–1.2)
Calcium: 9.9 mg/dL (ref 8.6–10.2)
Chloride: 104 mmol/L (ref 96–106)
Creatinine, Ser: 1.41 mg/dL — ABNORMAL HIGH (ref 0.76–1.27)
Globulin, Total: 3.2 g/dL (ref 1.5–4.5)
Glucose: 69 mg/dL — ABNORMAL LOW (ref 70–99)
Potassium: 4.7 mmol/L (ref 3.5–5.2)
Sodium: 140 mmol/L (ref 134–144)
Total Protein: 7.6 g/dL (ref 6.0–8.5)
eGFR: 55 mL/min/1.73 — ABNORMAL LOW (ref >59)

## 2023-12-28 LAB — CBC WITH DIFFERENTIAL/PLATELET
Basophils Absolute: 0.1 x10E3/uL (ref 0.0–0.2)
Basos: 1 %
EOS (ABSOLUTE): 0.5 x10E3/uL — ABNORMAL HIGH (ref 0.0–0.4)
Eos: 8 %
Hematocrit: 44.9 % (ref 37.5–51.0)
Hemoglobin: 13.6 g/dL (ref 13.0–17.7)
Immature Grans (Abs): 0.1 x10E3/uL (ref 0.0–0.1)
Immature Granulocytes: 1 %
Lymphocytes Absolute: 1.3 x10E3/uL (ref 0.7–3.1)
Lymphs: 20 %
MCH: 30.8 pg (ref 26.6–33.0)
MCHC: 30.3 g/dL — ABNORMAL LOW (ref 31.5–35.7)
MCV: 102 fL — ABNORMAL HIGH (ref 79–97)
Monocytes Absolute: 0.4 x10E3/uL (ref 0.1–0.9)
Monocytes: 6 %
Neutrophils Absolute: 4.1 x10E3/uL (ref 1.4–7.0)
Neutrophils: 64 %
Platelets: 202 x10E3/uL (ref 150–450)
RBC: 4.42 x10E6/uL (ref 4.14–5.80)
RDW: 13.9 % (ref 11.6–15.4)
WBC: 6.5 x10E3/uL (ref 3.4–10.8)

## 2023-12-28 LAB — PSA: Prostate Specific Ag, Serum: 0.7 ng/mL (ref 0.0–4.0)

## 2023-12-29 ENCOUNTER — Ambulatory Visit: Payer: Self-pay | Admitting: Physician Assistant

## 2024-01-11 ENCOUNTER — Telehealth: Payer: Self-pay | Admitting: Physician Assistant

## 2024-01-11 NOTE — Telephone Encounter (Signed)
 Contacted pt to schedule follow up visit with [facility]. No answer/unable to LVM

## 2024-04-09 ENCOUNTER — Ambulatory Visit: Admitting: Physician Assistant

## 2024-04-09 ENCOUNTER — Encounter: Payer: Self-pay | Admitting: Physician Assistant

## 2024-04-09 VITALS — BP 160/76 | HR 81

## 2024-04-09 DIAGNOSIS — I10 Essential (primary) hypertension: Secondary | ICD-10-CM

## 2024-04-09 DIAGNOSIS — M48062 Spinal stenosis, lumbar region with neurogenic claudication: Secondary | ICD-10-CM

## 2024-04-09 DIAGNOSIS — F10220 Alcohol dependence with intoxication, uncomplicated: Secondary | ICD-10-CM

## 2024-04-09 DIAGNOSIS — G8929 Other chronic pain: Secondary | ICD-10-CM

## 2024-04-09 DIAGNOSIS — G894 Chronic pain syndrome: Secondary | ICD-10-CM

## 2024-04-09 MED ORDER — LISINOPRIL 10 MG PO TABS: 10.0000 mg | ORAL_TABLET | Freq: Every day | ORAL | 1 refills | Status: DC

## 2024-04-09 MED ORDER — IBUPROFEN 800 MG PO TABS: 800.0000 mg | ORAL_TABLET | Freq: Three times a day (TID) | ORAL | 0 refills | Status: DC | PRN

## 2024-04-09 MED ORDER — CYCLOBENZAPRINE HCL 10 MG PO TABS: 10.0000 mg | ORAL_TABLET | Freq: Three times a day (TID) | ORAL | 0 refills | Status: AC | PRN

## 2024-04-09 NOTE — Progress Notes (Unsigned)
 Established Patient Office Visit  Subjective   Patient ID: [name] [name], male    DOB: 03-13-1956  Age: 68 y.o. MRN: [id number]  Chief Complaint  Patient presents with   Medication Problem    He wants something better for pain control than tylenol    Insomnia    Needs something better to help him sleep  Discussed the use of AI scribe software for clinical note transcription with the patient, who gave verbal consent to proceed.  History of Present Illness  History of Present Illness [name] [name] is a 68 year old male with hypertension and chronic back pain who presents for medication management and pain control.  He has hypertension treated with amlodipine  10 mg daily. Recent morning blood pressures have been elevated, with today's reading 147/80 mmHg. His blood pressure was better controlled around 127 mmHg while in a prior detox program, but he does not recall the medication. He previously tried lisinopril  and hydrochlorothiazide  but did not tolerate higher lisinopril  doses or hydrochlorothiazide  side effects.  He has severe chronic back and left shoulder pain with radiation down his leg, rated 8 to 10 out of 10, after four prior back surgeries. Prior steroid injections gave only temporary relief. Ibuprofen  800 mg and a muscle relaxer have provided incomplete control.  He has gout that worsens with hydrochlorothiazide . For pain he uses ibuprofen  800 mg every eight hours and Flexeril  every six hours as needed, preferring Flexeril  at night due to sedation.  He is currently in a long-term residential program for at least 90 days to help avoid marijuana use, which has previously made it difficult for him to manage at home.  He reports numbness and nerve-type sensations in his hand.  Physical Exam GENERAL: Alert, cooperative, well developed, no acute distress HEENT: Normocephalic, normal oropharynx, moist mucous membranes CHEST: Clear to auscultation bilaterally, no wheezes, rhonchi, or  crackles CARDIOVASCULAR: Normal heart rate and rhythm, S1 and S2 normal without murmurs ABDOMEN: Soft, non-tender, non-distended, without organomegaly, normal bowel sounds EXTREMITIES: No cyanosis or edema NEUROLOGICAL: Cranial nerves grossly intact, moves all extremities without gross motor or sensory deficit  Assessment and Plan Essential hypertension Blood pressure elevated with recent readings of 142/79 and 147/81. Lisinopril  previously not tolerated at higher doses. Currently on amlodipine . - Restarted lisinopril  at 10 mg daily. - Continue amlodipine  as currently prescribed. - Check blood pressure daily and maintain a log. - Review blood pressure log in two weeks.  Chronic low back pain with left sciatica and lumbar spinal stenosis Chronic low back pain with left sciatica, exacerbated by lumbar spinal stenosis. Previous surgeries and steroid injections provided temporary relief. Declined further steroid injection. - Started ibuprofen  800 mg every 8 hours as needed. - Started Flexeril  every 6 hours as needed. - Referred to orthopedics for further evaluation and management.  Left shoulder pain Persisting pain managed with ibuprofen  and muscle relaxers. - Continue current pain management with ibuprofen  and muscle relaxers as needed.     Past Medical History:  Diagnosis Date   Arthritis    right hip   Disc displacement, lumbar    Gout    Hypertension    Takes medications daily   Social History   Socioeconomic History   Marital status: Single    Spouse name: Not on file   Number of children: Not on file   Years of education: Not on file   Highest education level: Not on file  Occupational History   Not on file  Tobacco Use  Smoking status: Every Day    Current packs/day: 0.30    Average packs/day: 0.3 packs/day for 35.0 years (10.5 ttl pk-yrs)    Types: Cigarettes   Smokeless tobacco: Never  Substance and Sexual Activity   Alcohol use: Yes    Alcohol/week: 0.0  standard drinks of alcohol    Comment: occasionally   Drug use: Not Currently    Types: Marijuana, Cocaine    Comment: last date use: 07.05.2025   Sexual activity: Not Currently  Other Topics Concern   Not on file  Social History Narrative   Not on file   Social Drivers of Health   Financial Resource Strain: Not on file  Food Insecurity: Not on file  Transportation Needs: Not on file  Physical Activity: Not on file  Stress: Not on file  Social Connections: Not on file  Intimate Partner Violence: Not on file   Family History  Problem Relation Age of Onset   Hypertension Mother    Allergies  Allergen Reactions   Hctz [Hydrochlorothiazide ] Other (See Comments)    Erectile dysfunction    ROS    Objective:     BP (!) 160/76 (BP Location: Left Arm, Patient Position: Sitting, Cuff Size: Normal)   Pulse 81   SpO2 97%  BP Readings from Last 3 Encounters:  04/09/24 (!) 160/76  12/26/23 138/82  12/12/23 (!) 151/85   Wt Readings from Last 3 Encounters:  12/26/23 191 lb (86.6 kg)  12/12/23 186 lb (84.4 kg)  07/03/20 172 lb 9.9 oz (78.3 kg)    Physical Exam     Assessment & Plan:   Problem List Items Addressed This Visit   None    No AVS created, patient does not have access to MyChart at this time, no printer capability on screening van.  Patient education given through teach back method.    No follow-ups on file.    [name] RAMAN Mayers, PA-C

## 2024-04-10 ENCOUNTER — Encounter: Payer: Self-pay | Admitting: Physician Assistant

## 2024-04-23 ENCOUNTER — Ambulatory Visit: Admitting: Physician Assistant

## 2024-04-23 ENCOUNTER — Encounter: Payer: Self-pay | Admitting: Physician Assistant

## 2024-04-23 VITALS — BP 148/86 | HR 93 | Ht 73.0 in | Wt 194.0 lb

## 2024-04-23 DIAGNOSIS — F10220 Alcohol dependence with intoxication, uncomplicated: Secondary | ICD-10-CM

## 2024-04-23 DIAGNOSIS — R944 Abnormal results of kidney function studies: Secondary | ICD-10-CM

## 2024-04-23 DIAGNOSIS — I10 Essential (primary) hypertension: Secondary | ICD-10-CM

## 2024-04-23 DIAGNOSIS — G8929 Other chronic pain: Secondary | ICD-10-CM

## 2024-04-23 DIAGNOSIS — G894 Chronic pain syndrome: Secondary | ICD-10-CM

## 2024-04-23 MED ORDER — MELOXICAM 15 MG PO TABS: 15.0000 mg | ORAL_TABLET | Freq: Every day | ORAL | 1 refills | Status: DC

## 2024-04-23 MED ORDER — METHYLPREDNISOLONE 4 MG PO TBPK: ORAL_TABLET | ORAL | 0 refills | Status: AC

## 2024-04-23 NOTE — Progress Notes (Unsigned)
 Established Patient Office Visit  Subjective   Patient ID: [name] [name], male    DOB: 1955/10/04  Age: 68 y.o. MRN: [id number]  Chief Complaint  Patient presents with   Hypertension    2 week follow up  Discussed the use of AI scribe software for clinical note transcription with the patient, who gave verbal consent to proceed.  History of Present Illness    [name] [name] is a 68 year old male with chronic back pain and hypertension who presents for medication management and follow-up.  He continues to be treated for substance abuse at [facility] recovery center  He has continues to have persistent low back pain. He takes ibuprofen  800 mg at 8 AM and 8 PM and Tylenol  midday, which he finds effective. A muscle relaxer helps but causes marked morning drowsiness.  He has not had follow-up with orthopedics yet.  He has hypertension and has restarted lisinopril  10 mg daily while continuing amlodipine . Recent blood pressures have improved, with readings around 128/80 and 137/83 mmHg. He drinks adequate water.    Past Medical History:  Diagnosis Date   Arthritis    right hip   Disc displacement, lumbar    Gout    Hypertension    Takes medications daily   Social History   Socioeconomic History   Marital status: Single    Spouse name: Not on file   Number of children: Not on file   Years of education: Not on file   Highest education level: Not on file  Occupational History   Not on file  Tobacco Use   Smoking status: Every Day    Current packs/day: 0.30    Average packs/day: 0.3 packs/day for 35.0 years (10.5 ttl pk-yrs)    Types: Cigarettes   Smokeless tobacco: Never  Substance and Sexual Activity   Alcohol use: Yes    Alcohol/week: 0.0 standard drinks of alcohol    Comment: occasionally   Drug use: Not Currently    Types: Marijuana, Cocaine    Comment: last date use: 07.05.2025   Sexual activity: Not Currently  Other Topics Concern   Not on file  Social History  Narrative   Not on file   Social Drivers of Health   Tobacco Use: High Risk (04/24/2024)   Patient History    Smoking Tobacco Use: Every Day    Smokeless Tobacco Use: Never    Passive Exposure: Not on file  Financial Resource Strain: Not on file  Food Insecurity: Not on file  Transportation Needs: Not on file  Physical Activity: Not on file  Stress: Not on file  Social Connections: Not on file  Intimate Partner Violence: Not on file  Depression (EYV7-0): Not on file  Alcohol Screen: Not on file  Housing: Not on file  Utilities: Not on file  Health Literacy: Not on file   Family History  Problem Relation Age of Onset   Hypertension Mother    Allergies[1]  Review of Systems  Constitutional: Negative.   HENT: Negative.    Eyes: Negative.   Respiratory:  Negative for shortness of breath.   Cardiovascular:  Negative for chest pain.  Gastrointestinal: Negative.   Genitourinary: Negative.   Musculoskeletal:  Positive for joint pain and myalgias.  Skin: Negative.   Neurological: Negative.   Endo/Heme/Allergies: Negative.   Psychiatric/Behavioral: Negative.        Objective:     BP (!) 148/86 (BP Location: Left Arm, Patient Position: Sitting)   Pulse 93   Ht  6' 1 (1.854 m)   Wt 194 lb (88 kg)   SpO2 97%   BMI 25.60 kg/m  BP Readings from Last 3 Encounters:  04/23/24 (!) 148/86  04/09/24 (!) 160/76  12/26/23 138/82   Wt Readings from Last 3 Encounters:  04/23/24 194 lb (88 kg)  12/26/23 191 lb (86.6 kg)  12/12/23 186 lb (84.4 kg)    Physical Exam Vitals and nursing note reviewed.     GENERAL: Alert, cooperative, well developed, no acute distress HEENT: Normocephalic, normal oropharynx, moist mucous membranes CHEST: Clear to auscultation bilaterally, No wheezes, rhonchi, or crackles CARDIOVASCULAR: Normal heart rate and rhythm, S1 and S2 normal without murmurs ABDOMEN: Soft, non-tender, non-distended, without organomegaly, Normal bowel  sounds EXTREMITIES: No cyanosis or edema NEUROLOGICAL: Cranial nerves grossly intact, Moves all extremities without gross motor or sensory deficit   Assessment & Plan:   Problem List Items Addressed This Visit       Cardiovascular and Mediastinum   Hypertension - Primary     Other   Chronic pain   Relevant Medications   meloxicam  (MOBIC ) 15 MG tablet   methylPREDNISolone  (MEDROL  DOSEPAK) 4 MG TBPK tablet   Alcohol dependence (HCC)   Other Visit Diagnoses       Chronic bilateral low back pain with bilateral sciatica       Relevant Medications   meloxicam  (MOBIC ) 15 MG tablet   methylPREDNISolone  (MEDROL  DOSEPAK) 4 MG TBPK tablet     Chronic left shoulder pain       Relevant Medications   meloxicam  (MOBIC ) 15 MG tablet   methylPREDNISolone  (MEDROL  DOSEPAK) 4 MG TBPK tablet     Kidney function test abnormal       Relevant Orders   Basic Metabolic Panel (Completed)      1. Primary hypertension (Primary) Continue current regimen, patient encouraged to continue taking blood pressure at home, keeping a written log and have it available for all office visits.  Red flags given for prompt reevaluation  2. Chronic bilateral low back pain with bilateral sciatica Trial Mobic , Medrol  Dosepak.  Patient given contact information for orthopedic referral that was placed at last office visit. - meloxicam  (MOBIC ) 15 MG tablet; Take 1 tablet (15 mg total) by mouth daily.  Dispense: 30 tablet; Refill: 1 - methylPREDNISolone  (MEDROL  DOSEPAK) 4 MG TBPK tablet; Use per instructions on package  Dispense: 21 tablet; Refill: 0  3. Chronic left shoulder pain  - meloxicam  (MOBIC ) 15 MG tablet; Take 1 tablet (15 mg total) by mouth daily.  Dispense: 30 tablet; Refill: 1 - methylPREDNISolone  (MEDROL  DOSEPAK) 4 MG TBPK tablet; Use per instructions on package  Dispense: 21 tablet; Refill: 0  4. Kidney function test abnormal  - Basic Metabolic Panel  5. Chronic pain syndrome  - meloxicam  (MOBIC ) 15  MG tablet; Take 1 tablet (15 mg total) by mouth daily.  Dispense: 30 tablet; Refill: 1 - methylPREDNISolone  (MEDROL  DOSEPAK) 4 MG TBPK tablet; Use per instructions on package  Dispense: 21 tablet; Refill: 0  6. Alcohol dependence with uncomplicated intoxication (HCC) Currently in subs abuse treatment programa   I have reviewed the patient's medical history (PMH, PSH, Social History, Family History, Medications, and allergies) , and have been updated if relevant. I spent 30 minutes reviewing chart and  face to face time with patient.    Return in about 2 weeks (around 05/07/2024) for With MMU.    Cloey Sferrazza S Mayers, PA-C     [1]  Allergies Allergen Reactions  Hctz [Hydrochlorothiazide ] Other (See Comments)    Erectile dysfunction

## 2024-04-23 NOTE — Patient Instructions (Signed)
 VISIT SUMMARY:  Today, we discussed your chronic back pain, shoulder pain, hypertension, and kidney function. Adjustments were made to your medications, and a referral was provided for further evaluation of your back pain.  YOUR PLAN:  -CHRONIC LOW BACK PAIN WITH BILATERAL SCIATICA: Chronic low back pain with bilateral sciatica refers to ongoing pain in your lower back that radiates down both legs. To manage this, we have started you on Mobic  once daily and discontinued ibuprofen  to avoid frequent dosing and drowsiness from muscle relaxers. Continue taking Tylenol  for any breakthrough pain. You have been referred to orthopedics for further evaluation, and you should contact them to schedule an appointment.  -CHRONIC LEFT SHOULDER PAIN: Chronic left shoulder pain refers to ongoing pain in your left shoulder. We have started you on Mobic  once daily and discontinued ibuprofen . Continue taking Tylenol  for any breakthrough pain.  -PRIMARY HYPERTENSION: Primary hypertension is high blood pressure without a known secondary cause. Your blood pressure is currently well-controlled with lisinopril  and amlodipine . Continue taking these medications and monitor your blood pressure regularly.  -ABNORMAL KIDNEY FUNCTION TESTS: Previous blood tests showed a slight decrease in your kidney function. We have ordered a blood test to recheck your kidney function.

## 2024-04-24 ENCOUNTER — Encounter: Payer: Self-pay | Admitting: Physician Assistant

## 2024-04-24 ENCOUNTER — Ambulatory Visit: Payer: Self-pay | Admitting: Physician Assistant

## 2024-04-24 DIAGNOSIS — N1832 Chronic kidney disease, stage 3b: Secondary | ICD-10-CM

## 2024-04-24 LAB — BASIC METABOLIC PANEL WITH GFR
BUN/Creatinine Ratio: 23 (ref 10–24)
BUN: 41 mg/dL — ABNORMAL HIGH (ref 8–27)
CO2: 18 mmol/L — ABNORMAL LOW (ref 20–29)
Calcium: 9.6 mg/dL (ref 8.6–10.2)
Chloride: 106 mmol/L (ref 96–106)
Creatinine, Ser: 1.79 mg/dL — ABNORMAL HIGH (ref 0.76–1.27)
Glucose: 89 mg/dL (ref 70–99)
Potassium: 5.4 mmol/L — ABNORMAL HIGH (ref 3.5–5.2)
Sodium: 141 mmol/L (ref 134–144)
eGFR: 41 mL/min/1.73 — ABNORMAL LOW (ref >59)

## 2024-04-24 NOTE — Progress Notes (Signed)
 Left detailed message with Stacie Hill after care coordinator at [facility] with test results and providers recommendations.

## 2024-05-07 ENCOUNTER — Encounter: Payer: Self-pay | Admitting: Physician Assistant

## 2024-05-07 ENCOUNTER — Ambulatory Visit: Admitting: Physician Assistant

## 2024-05-07 VITALS — BP 155/84 | HR 99 | Ht 73.0 in | Wt 195.0 lb

## 2024-05-07 DIAGNOSIS — I10 Essential (primary) hypertension: Secondary | ICD-10-CM

## 2024-05-07 DIAGNOSIS — H5462 Unqualified visual loss, left eye, normal vision right eye: Secondary | ICD-10-CM

## 2024-05-07 DIAGNOSIS — M5442 Lumbago with sciatica, left side: Secondary | ICD-10-CM

## 2024-05-07 DIAGNOSIS — M5441 Lumbago with sciatica, right side: Secondary | ICD-10-CM

## 2024-05-07 DIAGNOSIS — G8929 Other chronic pain: Secondary | ICD-10-CM

## 2024-05-07 DIAGNOSIS — N1832 Chronic kidney disease, stage 3b: Secondary | ICD-10-CM | POA: Diagnosis not present

## 2024-05-07 DIAGNOSIS — F10220 Alcohol dependence with intoxication, uncomplicated: Secondary | ICD-10-CM | POA: Diagnosis not present

## 2024-05-07 DIAGNOSIS — Z9189 Other specified personal risk factors, not elsewhere classified: Secondary | ICD-10-CM

## 2024-05-07 MED ORDER — ATORVASTATIN CALCIUM 10 MG PO TABS: 5.0000 mg | ORAL_TABLET | Freq: Every evening | ORAL | 1 refills | Status: AC | PRN

## 2024-05-07 MED ORDER — LIDOCAINE 5 % EX PTCH: 1.0000 | MEDICATED_PATCH | CUTANEOUS | 0 refills | Status: AC

## 2024-05-07 MED ORDER — LISINOPRIL 20 MG PO TABS: 20.0000 mg | ORAL_TABLET | Freq: Every day | ORAL | 1 refills | Status: DC

## 2024-05-07 NOTE — Patient Instructions (Signed)
 VISIT SUMMARY:  Today, we discussed your chronic pain, kidney function, blood pressure, vision, and cardiovascular health. We made several changes to your medications and provided referrals for further evaluation.  YOUR PLAN:  -CHRONIC BILATERAL LOW BACK PAIN WITH BILATERAL SCIATICA: This condition involves persistent pain in your lower back that radiates down both legs. We stopped meloxicam  due to potential kidney damage and started you on lidocaine  patches to use once daily for 12 hours. You can use ibuprofen  and Tylenol  as needed, but with caution. We also referred you to orthopedics for further evaluation.  -STAGE 3B CHRONIC KIDNEY DISEASE: This stage of kidney disease means your kidneys are moderately damaged and not working as well as they should. We referred you to nephrology for further evaluation and management. It's important to monitor your kidney function regularly and avoid medications that can harm your kidneys, like NSAIDs.  -PRIMARY HYPERTENSION: This is high blood pressure, which can affect your kidney function and overall health. We increased your lisinopril  dose to 20 mg daily and advised you to monitor your blood pressure regularly. Increasing your water  intake can also help support your kidney function.  -LEFT EYE BLINDNESS: You have been blind in your left eye since childhood and are now experiencing vision issues in your right eye. We referred you to ophthalmology for a vision assessment and management.  -CARDIOVASCULAR RISK REDUCTION WITH STATIN THERAPY: Due to your kidney function decline and other risk factors, we started you on atorvastatin  5 mg daily at bedtime to reduce your risk of cardiovascular disease. We will reassess your cholesterol levels and cardiovascular risk in 3-6 months.

## 2024-05-07 NOTE — Progress Notes (Unsigned)
 "  Established Patient Office Visit  Subjective   Patient ID: [name] [name], male    DOB: March 19, 1956  Age: 68 y.o. MRN: [id number]  Chief Complaint  Patient presents with   Hypertension    2 week follow up   Discussed the use of AI scribe software for clinical note transcription with the patient, who gave verbal consent to proceed.  History of Present Illness   [name] [name] is a 68 year old male with chronic pain and chronic kidney disease who presents for pain management and evaluation of kidney function.  He is currently being treated for substance abuse at [facility] recovery center, states he will be returning to his home in [place] on February 22  His chronic pain persists despite meloxicam  and a completed steroid pack, which gave only temporary relief. Pain limits walking and causes frequent imbalance with a sensation that his legs may give out.  He is blind in his left eye after childhood strabismus surgery and has noted worsening vision in his right eye, which he uses with black reading glasses.  He was recently told his kidney function worsened with GFR 41. He has recurrent urinary difficulty with a sensation of needing to void but poor stream and no pressure, similar to a prior episode treated with an unknown medication.  He takes lisinopril  and amlodipine  for blood pressure and Seroquel. Tylenol  does not relieve his pain. He avoids ibuprofen  with meloxicam  because of kidney concerns.  Recent home blood pressures have been elevated, including 156/87, 171/93, 139/86, 145/85, 140/84, and 155/84.   07/02/23 Blind in left eye    Past Medical History:  Diagnosis Date   Arthritis    right hip   Disc displacement, lumbar    Gout    Hypertension    Takes medications daily   Social History   Socioeconomic History   Marital status: Single    Spouse name: Not on file   Number of children: Not on file   Years of education: Not on file   Highest education level:  Not on file  Occupational History   Not on file  Tobacco Use   Smoking status: Every Day    Current packs/day: 0.30    Average packs/day: 0.3 packs/day for 35.0 years (10.5 ttl pk-yrs)    Types: Cigarettes   Smokeless tobacco: Never  Substance and Sexual Activity   Alcohol use: Yes    Alcohol/week: 0.0 standard drinks of alcohol    Comment: occasionally   Drug use: Not Currently    Types: Marijuana, Cocaine    Comment: last date use: 07.05.2025   Sexual activity: Not Currently  Other Topics Concern   Not on file  Social History Narrative   Not on file   Social Drivers of Health   Tobacco Use: High Risk (05/07/2024)   Patient History    Smoking Tobacco Use: Every Day    Smokeless Tobacco Use: Never    Passive Exposure: Not on file  Financial Resource Strain: Not on file  Food Insecurity: Not on file  Transportation Needs: Not on file  Physical Activity: Not on file  Stress: Not on file  Social Connections: Not on file  Intimate Partner Violence: Not on file  Depression (EYV7-0): Not on file  Alcohol Screen: Not on file  Housing: Not on file  Utilities: Not on file  Health Literacy: Not on file   Family History  Problem Relation Age of Onset   Hypertension Mother    Allergies[1]  Review  of Systems  Constitutional: Negative.   HENT: Negative.    Eyes: Negative.   Respiratory:  Negative for shortness of breath.   Cardiovascular:  Negative for chest pain.  Gastrointestinal: Negative.   Genitourinary:  Negative for dysuria, frequency and urgency.  Musculoskeletal:  Positive for back pain and myalgias.  Skin: Negative.   Neurological: Negative.   Endo/Heme/Allergies: Negative.   Psychiatric/Behavioral: Negative.        Objective:     BP (!) 155/84 (BP Location: Left Arm, Patient Position: Sitting)   Pulse 99   Ht 6' 1 (1.854 m)   Wt 195 lb (88.5 kg)   SpO2 98%   BMI 25.73 kg/m  BP Readings from Last 3 Encounters:  05/07/24 (!) 155/84   04/23/24 (!) 148/86  04/09/24 (!) 160/76   Wt Readings from Last 3 Encounters:  05/07/24 195 lb (88.5 kg)  04/23/24 194 lb (88 kg)  12/26/23 191 lb (86.6 kg)    Physical Exam Vitals and nursing note reviewed.    Physical Exam VITALS: BP- 155/84 MEASUREMENTS: Weight- 195. GENERAL: Alert, cooperative, well developed, no acute distress HEENT: Normocephalic, normal oropharynx, moist mucous membranes CHEST: Clear to auscultation bilaterally, No wheezes, rhonchi, or crackles CARDIOVASCULAR: Normal heart rate and rhythm, S1 and S2 normal without murmurs ABDOMEN: Soft, non-tender, non-distended, without organomegaly, Normal bowel sounds EXTREMITIES: No cyanosis or edema NEUROLOGICAL: Cranial nerves grossly intact, Moves all extremities without gross motor or sensory deficit     Assessment & Plan:   Problem List Items Addressed This Visit       Cardiovascular and Mediastinum   Hypertension   Relevant Medications   lisinopril  (ZESTRIL ) 20 MG tablet   atorvastatin  (LIPITOR) 10 MG tablet     Other   Alcohol dependence (HCC)   Other Visit Diagnoses       Left eye blindness of unknown category with normal vision of right eye    -  Primary   Relevant Orders   Ambulatory referral to Ophthalmology     Stage 3b chronic kidney disease (HCC)         Chronic bilateral low back pain with bilateral sciatica       Relevant Medications   lidocaine  (LIDODERM ) 5 %     Candidate for statin therapy due to risk of future cardiovascular event       Relevant Medications   atorvastatin  (LIPITOR) 10 MG tablet       Assessment and Plan  Chronic bilateral low back pain with bilateral sciatica Chronic pain not managed with meloxicam . Risk of kidney damage from NSAIDs outweighs benefits. - Discontinued meloxicam . - Initiated lidocaine  patches 5% once daily for 12 hours. - Use ibuprofen  and Tylenol  as needed, with caution. - Referred to orthopedics for further evaluation and  management.  Stage 3b chronic kidney disease GFR 41. Potential contributing factors include age, blood pressure, and medication use. Discussed importance of monitoring kidney function and adjusting medications. - Referred to nephrology for further evaluation and management. - Monitor kidney function regularly. - Ensure medications are kidney-friendly, avoiding NSAIDs like meloxicam  and ibuprofen  when possible.  Primary hypertension Recent elevated readings. Discussed importance of blood pressure control for kidney function and overall health. - Increased lisinopril  to 20 mg daily. - Monitor blood pressure regularly. - Encouraged increased water  intake to support kidney function.  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 Reports difficulty with vision in right eye, requires updated prescription glasses. - Referred to ophthalmology for vision assessment and management.  Cardiovascular risk reduction with statin therapy Cholesterol levels normal, but due to kidney function decline and other risk factors, statin therapy recommended to reduce cardiovascular risk. - Initiated atorvastatin  5 mg daily at bedtime. - Will reassess cholesterol levels and cardiovascular risk in 3-6 months.   Return in about 2 weeks (around 05/21/2024) for With MMU.    Corbyn Wildey S Mayers, PA-C       [1] Allergies Allergen Reactions   Hctz [Hydrochlorothiazide ] Other (See Comments)    Erectile dysfunction  "

## 2024-05-25 ENCOUNTER — Other Ambulatory Visit: Payer: Self-pay

## 2024-05-25 ENCOUNTER — Ambulatory Visit: Admitting: Physician Assistant

## 2024-05-25 ENCOUNTER — Other Ambulatory Visit (INDEPENDENT_AMBULATORY_CARE_PROVIDER_SITE_OTHER): Payer: Self-pay

## 2024-05-25 ENCOUNTER — Encounter: Payer: Self-pay | Admitting: Physician Assistant

## 2024-05-25 DIAGNOSIS — G894 Chronic pain syndrome: Secondary | ICD-10-CM

## 2024-05-25 DIAGNOSIS — M48062 Spinal stenosis, lumbar region with neurogenic claudication: Secondary | ICD-10-CM

## 2024-05-25 NOTE — Progress Notes (Signed)
 "  Office Visit Note   Patient: [name] [name]           Date of Birth: 1955-06-07           MRN: [id number] Visit Date: 05/25/2024              Requested by: Mayers, Kirk RAMAN, PA-C [address] [place],  [place] [address] PCP: Gwenith Shuck, NP   Assessment & Plan: Visit Diagnoses:  1. Chronic pain syndrome   2. 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  He is currently in inpatient treatment for cocaine abuse.  He is finishing his inpatient treatment in February.  He has a long history of low back issues.  He has had multilevel fusion with Dr. Colon in 2013.  He is continuing to complain of low back pain.  With most of his symptoms being running down his legs.  No loss of bowel or bladder control no weakness.  His other concern is his paresthesias that go from 1 arm travel up and go to the other arm.  This is not in a typical distribution of carpal tunnel and he has very little extension of his neck.  Reproduces his symptoms.  Again his strength is intact but he is getting increasing problems as he is having trouble sleeping.  He does have x-rays with multilevel degenerative changes in his neck and straightening of the normal lordotic curve.  I recommended the following I would like for him to get physical therapy.  Also could refer to chronic pain management as I think ultimately this is may be what he needs.  Would like for him to circle back with Dr. Colon regarding his low back and Dr. Colon could also evaluate his cervical spine and see if injections might be an option may follow-up with us  as needed  Follow-Up Instructions: Return if symptoms worsen or fail to improve.   Orders:  Orders Placed This Encounter  Procedures   XR Cervical Spine 2 or 3 views   XR Lumbar Spine 2-3 Views   No orders of the defined types were placed in this encounter.     Procedures: No  procedures performed   Clinical Data: No additional findings.   Subjective: No chief complaint on file.   HPI patient is a 69 year old gentleman comes in today for 2 issues for CC heart starts right hand goes numb and tingling and feels like it always travels all the way over the left hand.  He cannot sleep on either side wakes up with pain.  He also is here for evaluated for chronic back pain.  Apparently he has had 4 previous surgeries done elsewhere.  He is a surveyor, minerals which exacerbates the pain.  He cannot take a a lot of anti-inflammatories because he has chronic kidney disease.  He is taking Tylenol  for pain without much help  Review of Systems  All other systems reviewed and are negative.    Objective: Vital Signs: There were no vitals taken for this visit.  Physical Exam Constitutional:      Appearance: Normal appearance.  Pulmonary:     Effort: Pulmonary effort is normal.  Skin:    General: Skin is warm and dry.  Neurological:     General: No focal deficit present.     Mental Status: He is alert and oriented to person, place, and time.  Psychiatric:  Mood and Affect: Mood normal.        Behavior: Behavior normal.     Ortho Exam Low back: He has obvious well-healed surgical incision no fluctuance no sign of infection.  No crepitus.  He has limited motion because of his multilevel fusion.  Pain is not really in the back but more in the buttock.  He has 5 out of 5 strength with resisted dorsiflexion plantarflexion of his ankles extension and flexion of his legs no pain in the groin area.  Sensation is intact.  Negative straight leg raise bilaterally Examination of his neck and shoulders he has good neck flexion and side-to-side turning but very little extension it reproduces the paresthesias.  He does have some limits with his shoulders which could question a shoulder component but most everything is consistent with cervical radiculopathy he has good biceps tricep  strength he has distribution of his paresthesia in both of his hands over the dorsum and volar surface of all fingers.  Inconsistent with a carpal tunnel presentation negative Tinel's sign.  Compartments of the lower extremities are soft nontender negative Toula' sign Specialty Comments:  No specialty comments available.  Imaging: XR Lumbar Spine 2-3 Views Result Date: 05/25/2024 Radiographs of his lumbar spine findings consistent with previous L3-4 L4-5 L5-S1 fusion.  Hardware in place multilevel degenerative changes    PMFS History: Patient Active Problem List   Diagnosis Date Noted   Acute respiratory failure (HCC)    Chest tube in place    Loculated pleural effusion    Anemia of chronic disease    Parapneumonic effusion 07/02/2020   Acute kidney injury    Abscess of lung with pneumonia (HCC)    Monocular vision loss    Cavitary pneumonia    Optic neuritis    Necrotizing pneumonia (HCC) 06/15/2020   Alcohol dependence (HCC) 05/20/2014   Hypertension 04/30/2011   Spinal stenosis, lumbar region, with neurogenic claudication 02/01/2011   Hepatitis C virus infection without hepatic coma 02/01/2011   Chronic pain 02/01/2011   HIP PAIN, RIGHT 02/02/2010   Past Medical History:  Diagnosis Date   Arthritis    right hip   Disc displacement, lumbar    Gout    Hypertension    Takes medications daily    Family History  Problem Relation Age of Onset   Hypertension Mother     Past Surgical History:  Procedure Laterality Date   BACK SURGERY  1980s   x2:2013one back surgery   BRONCHIAL BIOPSY  06/19/2020   Procedure: BRONCHIAL BIOPSIES;  Surgeon: Jude Harden GAILS, MD;  Location: [facility] ENDOSCOPY;  Service: Cardiopulmonary;;   BRONCHIAL BRUSHINGS  06/19/2020   Procedure: BRONCHIAL BRUSHINGS;  Surgeon: Jude Harden GAILS, MD;  Location: [facility] ENDOSCOPY;  Service: Cardiopulmonary;;   BRONCHIAL WASHINGS  06/19/2020   Procedure: BRONCHIAL WASHINGS;  Surgeon: Jude Harden GAILS, MD;  Location: [facility]  ENDOSCOPY;  Service: Cardiopulmonary;;   CHEST TUBE INSERTION N/A 07/03/2020   Procedure: CHEST TUBE INSERTION;  Surgeon: Mannam, Praveen, MD;  Location: [facility] ENDOSCOPY;  Service: Cardiopulmonary;  Laterality: N/A;   COLONOSCOPY WITH PROPOFOL  N/A 09/18/2013   Procedure: COLONOSCOPY WITH PROPOFOL ;  Surgeon: Gladis MARLA Louder, MD;  Location: [facility] ENDOSCOPY;  Service: Endoscopy;  Laterality: N/A;   IR THORACENTESIS RIGHT ASP PLEURAL SPACE W/IMG GUIDE  07/02/2020   VIDEO BRONCHOSCOPY N/A 06/19/2020   Procedure: VIDEO BRONCHOSCOPY WITH FLUORO;  Surgeon: Jude Harden GAILS, MD;  Location: [name] ENDOSCOPY;  Service: Cardiopulmonary;  Laterality: N/A;   Social History  Occupational History   Not on file  Tobacco Use   Smoking status: Every Day    Current packs/day: 0.30    Average packs/day: 0.3 packs/day for 35.0 years (10.5 ttl pk-yrs)    Types: Cigarettes   Smokeless tobacco: Never  Substance and Sexual Activity   Alcohol use: Yes    Alcohol/week: 0.0 standard drinks of alcohol    Comment: occasionally   Drug use: Not Currently    Types: Marijuana, Cocaine    Comment: last date use: 07.05.2025   Sexual activity: Not Currently        "

## 2024-05-28 ENCOUNTER — Encounter: Payer: Self-pay | Admitting: Physician Assistant

## 2024-05-28 ENCOUNTER — Ambulatory Visit: Admitting: Physician Assistant

## 2024-05-28 VITALS — BP 157/90 | HR 90 | Ht 72.0 in | Wt 206.0 lb

## 2024-05-28 DIAGNOSIS — G894 Chronic pain syndrome: Secondary | ICD-10-CM | POA: Diagnosis not present

## 2024-05-28 DIAGNOSIS — E559 Vitamin D deficiency, unspecified: Secondary | ICD-10-CM | POA: Diagnosis not present

## 2024-05-28 DIAGNOSIS — I10 Essential (primary) hypertension: Secondary | ICD-10-CM | POA: Diagnosis not present

## 2024-05-28 DIAGNOSIS — N1832 Chronic kidney disease, stage 3b: Secondary | ICD-10-CM

## 2024-05-28 DIAGNOSIS — M5442 Lumbago with sciatica, left side: Secondary | ICD-10-CM | POA: Diagnosis not present

## 2024-05-28 DIAGNOSIS — F10220 Alcohol dependence with intoxication, uncomplicated: Secondary | ICD-10-CM | POA: Diagnosis not present

## 2024-05-28 DIAGNOSIS — H5462 Unqualified visual loss, left eye, normal vision right eye: Secondary | ICD-10-CM

## 2024-05-28 DIAGNOSIS — G8929 Other chronic pain: Secondary | ICD-10-CM

## 2024-05-28 DIAGNOSIS — M5441 Lumbago with sciatica, right side: Secondary | ICD-10-CM

## 2024-05-28 MED ORDER — LISINOPRIL 20 MG PO TABS: 20.0000 mg | ORAL_TABLET | Freq: Every day | ORAL | 1 refills | Status: AC

## 2024-05-28 MED ORDER — GABAPENTIN 100 MG PO CAPS: 100.0000 mg | ORAL_CAPSULE | Freq: Three times a day (TID) | ORAL | 3 refills | Status: AC

## 2024-05-28 MED ORDER — DICLOFENAC SODIUM 1 % EX GEL: 4.0000 g | Freq: Four times a day (QID) | CUTANEOUS | 1 refills | Status: AC | PRN

## 2024-05-28 NOTE — Progress Notes (Signed)
 "  Established Patient Office Visit  Subjective   Patient ID: [name] [name], male    DOB: 08-13-1955  Age: 69 y.o. MRN: [id number]  Chief Complaint  Patient presents with   Hypertension    Follow up on chronic health conditions and medication management   Discussed the use of AI scribe software for clinical note transcription with the patient, who gave verbal consent to proceed.  History of Present Illness    [name] [name] is a 69 year old male with chronic pain syndrome and spinal stenosis who presents with worsening back and hand pain.  He continues to be treated for substance abuse at [facility] recovery center  He reports severe back pain radiating to his hands with numbness down both arms, which limits his ability to raise his hands and walk, especially at night, and extends to the backs of his legs. He had prior back surgery. He is waiting on insurance approval for physical therapy and pain management referrals.   He uses Voltaren  cream for hand pain up to four times daily with partial relief and reports no benefit from lidocaine  patches.  He notes eye strain and headaches that previously improved with yellow-cap eye drops, but he has not been able to see an ophthalmologist recently.  He states his blood pressure has been well controlled on lisinopril , increased to 20 mg, with good readings over the past few months and increased water  intake. He recently started a cholesterol medication.     Past Medical History:  Diagnosis Date   Arthritis    right hip   Disc displacement, lumbar    Gout    Hypertension    Takes medications daily   Social History   Socioeconomic History   Marital status: Single    Spouse name: Not on file   Number of children: Not on file   Years of education: Not on file   Highest education level: Not on file  Occupational History   Not on file  Tobacco Use   Smoking status: Every Day    Current packs/day: 0.30    Average packs/day: 0.3 packs/day  for 35.0 years (10.5 ttl pk-yrs)    Types: Cigarettes   Smokeless tobacco: Never  Substance and Sexual Activity   Alcohol use: Yes    Alcohol/week: 0.0 standard drinks of alcohol    Comment: occasionally   Drug use: Not Currently    Types: Marijuana, Cocaine    Comment: last date use: 07.05.2025   Sexual activity: Not Currently  Other Topics Concern   Not on file  Social History Narrative   Not on file   Social Drivers of Health   Tobacco Use: High Risk (05/28/2024)   Patient History    Smoking Tobacco Use: Every Day    Smokeless Tobacco Use: Never    Passive Exposure: Not on file  Financial Resource Strain: Not on file  Food Insecurity: Not on file  Transportation Needs: Not on file  Physical Activity: Not on file  Stress: Not on file  Social Connections: Not on file  Intimate Partner Violence: Not on file  Depression (EYV7-0): Not on file  Alcohol Screen: Not on file  Housing: Not on file  Utilities: Not on file  Health Literacy: Not on file   Family History  Problem Relation Age of Onset   Hypertension Mother    Allergies[1]  Review of Systems  Constitutional: Negative.   HENT: Negative.    Eyes: Negative.  Negative for blurred vision and pain.  Respiratory:  Negative for shortness of breath.   Cardiovascular:  Negative for chest pain.  Gastrointestinal: Negative.   Genitourinary: Negative.   Musculoskeletal:  Positive for back pain, joint pain, myalgias and neck pain.  Neurological:  Positive for headaches.  Endo/Heme/Allergies: Negative.   Psychiatric/Behavioral: Negative.        Objective:     BP (!) 157/90 (BP Location: Left Arm, Patient Position: Sitting)   Pulse 90   Ht 6' (1.829 m)   Wt 206 lb (93.4 kg)   SpO2 97%   BMI 27.94 kg/m  BP Readings from Last 3 Encounters:  05/28/24 (!) 157/90  05/07/24 (!) 155/84  04/23/24 (!) 148/86   Wt Readings from Last 3 Encounters:  05/28/24 206 lb (93.4 kg)  05/07/24 195 lb (88.5 kg)  04/23/24 194  lb (88 kg)    Physical Exam Vitals and nursing note reviewed.    GENERAL: Alert, cooperative, well developed, no acute distress HEENT: Normocephalic, normal oropharynx, moist mucous membranes. Blind left eye CHEST: Clear to auscultation bilaterally, no wheezes, rhonchi, or crackles CARDIOVASCULAR: Normal heart rate and rhythm, S1 and S2 normal without murmurs EXTREMITIES: No cyanosis or edema NEUROLOGICAL: Cranial nerves grossly intact, moves all extremities without gross motor or sensory deficit    Assessment & Plan:   Problem List Items Addressed This Visit       Cardiovascular and Mediastinum   Hypertension - Primary   Relevant Medications   lisinopril  (ZESTRIL ) 20 MG tablet     Other   Chronic pain   Relevant Medications   gabapentin  (NEURONTIN ) 100 MG capsule   diclofenac  Sodium (VOLTAREN ) 1 % GEL   Other Relevant Orders   Vitamin D , 25-hydroxy   Alcohol dependence (HCC)   Other Visit Diagnoses       Left eye blindness of unknown category with normal vision of right eye         Stage 3b chronic kidney disease (HCC)       Relevant Orders   Basic Metabolic Panel     Chronic bilateral low back pain with bilateral sciatica       Relevant Medications   gabapentin  (NEURONTIN ) 100 MG capsule   diclofenac  Sodium (VOLTAREN ) 1 % GEL   Other Relevant Orders   Vitamin D , 25-hydroxy     Vitamin D  deficiency          1. Primary hypertension (Primary) Continue current regimen.  Patient education given on supportive care, red flags given for prompt reevaluation - lisinopril  (ZESTRIL ) 20 MG tablet; Take 1 tablet (20 mg total) by mouth daily.  Dispense: 30 tablet; Refill: 1  2. Left eye blindness of unknown category with normal vision of right eye Once again encouraged to follow-up with ophthalmology, referral placed earlier  3. Stage 3b chronic kidney disease (HCC) Has appointment to see nephrology at the end of this week - Basic Metabolic Panel  4. Chronic bilateral  low back pain with bilateral sciatica Restart gabapentin  100 mg, continue Voltaren .  Consider Cymbalta if gabapentin  ineffective. - gabapentin  (NEURONTIN ) 100 MG capsule; Take 1 capsule (100 mg total) by mouth 3 (three) times daily.  Dispense: 90 capsule; Refill: 3 - diclofenac  Sodium (VOLTAREN ) 1 % GEL; Apply 4 g topically 4 (four) times daily as needed.  Dispense: 150 g; Refill: 1 - Vitamin D , 25-hydroxy  5. Chronic pain syndrome  - gabapentin  (NEURONTIN ) 100 MG capsule; Take 1 capsule (100 mg total) by mouth 3 (three) times daily.  Dispense: 90 capsule; Refill: 3 -  diclofenac  Sodium (VOLTAREN ) 1 % GEL; Apply 4 g topically 4 (four) times daily as needed.  Dispense: 150 g; Refill: 1 - Vitamin D , 25-hydroxy  6. Vitamin D  deficiency   7. Alcohol dependence with uncomplicated intoxication (HCC) Currently in subs abuse treatment program   I have reviewed the patient's medical history (PMH, PSH, Social History, Family History, Medications, and allergies) , and have been updated if relevant. I spent 30 minutes reviewing chart and  face to face time with patient.      Return in about 2 weeks (around 06/11/2024) for With MMU.    Sorina Derrig S Mayers, PA-C     [1]  Allergies Allergen Reactions   Hctz [Hydrochlorothiazide ] Other (See Comments)    Erectile dysfunction   "

## 2024-05-28 NOTE — Patient Instructions (Signed)
 VISIT SUMMARY:  During today's visit, we discussed your chronic pain, back and hand pain, eye strain, blood pressure, and vitamin D  levels. We reviewed your current medications and made some adjustments to better manage your symptoms.  YOUR PLAN:  -CHRONIC PAIN SYNDROME WITH CHRONIC LOW BACK PAIN AND BILATERAL SCIATICA: Chronic pain syndrome involves long-term pain that can affect various parts of the body. Your back pain radiates to your hands and legs, causing numbness and limiting your mobility. We have started you on gabapentin  100 mg three times daily, refilled your Voltaren  cream for use up to four times daily as needed.  If gabapentin  is not effective, we may consider trying Cymbalta.   -PRIMARY HYPERTENSION: Hypertension is high blood pressure. Your blood pressure is well-controlled with your current medication, lisinopril , so we will continue with the same dosage.  -LEFT EYE VISUAL LOSS: You have experienced eye strain and headaches, which previously improved with eye drops. We are waiting for your ophthalmology referral to be approved for further evaluation and management.  -VITAMIN D  DEFICIENCY: Vitamin D  deficiency can contribute to pain and other health issues. We have checked your vitamin D  levels today to determine if you need supplementation.

## 2024-05-29 ENCOUNTER — Ambulatory Visit: Payer: Self-pay | Admitting: Physician Assistant

## 2024-05-29 DIAGNOSIS — E559 Vitamin D deficiency, unspecified: Secondary | ICD-10-CM

## 2024-05-29 LAB — BASIC METABOLIC PANEL WITH GFR
BUN/Creatinine Ratio: 23 (ref 10–24)
BUN: 36 mg/dL — ABNORMAL HIGH (ref 8–27)
CO2: 17 mmol/L — ABNORMAL LOW (ref 20–29)
Calcium: 9.5 mg/dL (ref 8.6–10.2)
Chloride: 109 mmol/L — ABNORMAL HIGH (ref 96–106)
Creatinine, Ser: 1.6 mg/dL — ABNORMAL HIGH (ref 0.76–1.27)
Glucose: 115 mg/dL — ABNORMAL HIGH (ref 70–99)
Potassium: 5.5 mmol/L — ABNORMAL HIGH (ref 3.5–5.2)
Sodium: 140 mmol/L (ref 134–144)
eGFR: 47 mL/min/1.73 — ABNORMAL LOW

## 2024-05-29 LAB — VITAMIN D 25 HYDROXY (VIT D DEFICIENCY, FRACTURES): Vit D, 25-Hydroxy: 19.4 ng/mL — ABNORMAL LOW (ref 30.0–100.0)

## 2024-05-29 MED ORDER — VITAMIN D (ERGOCALCIFEROL) 1.25 MG (50000 UNIT) PO CAPS: 50000.0000 [IU] | ORAL_CAPSULE | ORAL | 2 refills | Status: AC

## 2024-05-29 NOTE — Progress Notes (Signed)
 Detailed message left with Stacie Hill aftercare coordinator at [facility] with Test results and provider recommendations.

## 2024-06-07 ENCOUNTER — Other Ambulatory Visit: Payer: Self-pay

## 2024-06-07 DIAGNOSIS — N1832 Chronic kidney disease, stage 3b: Secondary | ICD-10-CM

## 2024-06-15 ENCOUNTER — Telehealth: Payer: Self-pay

## 2024-06-15 NOTE — Telephone Encounter (Signed)
 Received a fax back from [facility], that stated the patient could not be contacted at the given numbers. I tried calling the patient today, to give him the phone # to [place] -- phone number not in service at this time. The patient does not have MyChart. If the patient does call our office regarding this referral, please give #[phone], ext [phone], for patient to call and schedule his own appt.

## 2024-06-19 ENCOUNTER — Other Ambulatory Visit
